# Patient Record
Sex: Male | Born: 1950
Health system: Southern US, Community
[De-identification: ages and names within clinical notes are randomized; demographics above are authoritative.]

## PROBLEM LIST (undated history)

## (undated) DIAGNOSIS — H60399 Other infective otitis externa, unspecified ear: Secondary | ICD-10-CM

## (undated) DIAGNOSIS — I214 Non-ST elevation (NSTEMI) myocardial infarction: Secondary | ICD-10-CM

## (undated) DIAGNOSIS — I2510 Atherosclerotic heart disease of native coronary artery without angina pectoris: Secondary | ICD-10-CM

## (undated) DIAGNOSIS — K219 Gastro-esophageal reflux disease without esophagitis: Secondary | ICD-10-CM

## (undated) DIAGNOSIS — Z860100 Personal history of colon polyps, unspecified: Secondary | ICD-10-CM

## (undated) DIAGNOSIS — I700 Atherosclerosis of aorta: Secondary | ICD-10-CM

## (undated) DIAGNOSIS — K2270 Barrett's esophagus without dysplasia: Secondary | ICD-10-CM

## (undated) DIAGNOSIS — K5909 Other constipation: Secondary | ICD-10-CM

## (undated) DIAGNOSIS — E7800 Pure hypercholesterolemia, unspecified: Secondary | ICD-10-CM

## (undated) DIAGNOSIS — E785 Hyperlipidemia, unspecified: Secondary | ICD-10-CM

## (undated) DIAGNOSIS — I6529 Occlusion and stenosis of unspecified carotid artery: Secondary | ICD-10-CM

## (undated) DIAGNOSIS — J449 Chronic obstructive pulmonary disease, unspecified: Secondary | ICD-10-CM

## (undated) DIAGNOSIS — I739 Peripheral vascular disease, unspecified: Secondary | ICD-10-CM

## (undated) DIAGNOSIS — H409 Unspecified glaucoma: Secondary | ICD-10-CM

## (undated) DIAGNOSIS — Z972 Presence of dental prosthetic device (complete) (partial): Secondary | ICD-10-CM

## (undated) DIAGNOSIS — J4489 Other specified chronic obstructive pulmonary disease: Secondary | ICD-10-CM

## (undated) HISTORY — DX: Peripheral vascular disease, unspecified: I73.9

## (undated) HISTORY — DX: Gastro-esophageal reflux disease without esophagitis: K21.9

## (undated) HISTORY — DX: Atherosclerosis of aorta: I70.0

## (undated) HISTORY — DX: Pure hypercholesterolemia, unspecified: E78.00

## (undated) HISTORY — DX: Chronic obstructive pulmonary disease, unspecified: J44.9

## (undated) HISTORY — DX: Personal history of colonic polyps: Z86.010

## (undated) HISTORY — DX: Occlusion and stenosis of unspecified carotid artery: I65.29

## (undated) HISTORY — DX: Other constipation: K59.09

## (undated) HISTORY — PX: ESOPHAGOGASTRODUODENOSCOPY: SHX1529

## (undated) HISTORY — DX: Other infective otitis externa, unspecified ear: H60.399

## (undated) HISTORY — PX: UMBILICAL HERNIA REPAIR: SHX196

## (undated) HISTORY — DX: Other specified chronic obstructive pulmonary disease: J44.89

## (undated) HISTORY — DX: Atherosclerotic heart disease of native coronary artery without angina pectoris: I25.10

## (undated) HISTORY — DX: Hyperlipidemia, unspecified: E78.5

## (undated) HISTORY — DX: Unspecified glaucoma: H40.9

## (undated) HISTORY — PX: CORONARY STENT PLACEMENT: SHX1402

## (undated) HISTORY — DX: Personal history of colon polyps, unspecified: Z86.0100

## (undated) HISTORY — DX: Barrett's esophagus without dysplasia: K22.70

---

## 2002-09-23 ENCOUNTER — Encounter: Payer: Self-pay | Admitting: Internal Medicine

## 2003-02-17 ENCOUNTER — Encounter: Payer: Self-pay | Admitting: Internal Medicine

## 2005-08-09 ENCOUNTER — Ambulatory Visit: Payer: Self-pay | Admitting: Internal Medicine

## 2006-03-07 ENCOUNTER — Ambulatory Visit: Payer: Self-pay | Admitting: Internal Medicine

## 2006-03-15 ENCOUNTER — Encounter: Payer: Self-pay | Admitting: Internal Medicine

## 2006-03-15 ENCOUNTER — Ambulatory Visit: Payer: Self-pay | Admitting: Cardiology

## 2006-03-15 ENCOUNTER — Encounter (HOSPITAL_COMMUNITY): Admission: RE | Admit: 2006-03-15 | Discharge: 2006-03-15 | Payer: Self-pay | Admitting: Internal Medicine

## 2006-03-17 ENCOUNTER — Inpatient Hospital Stay (HOSPITAL_COMMUNITY): Admission: AD | Admit: 2006-03-17 | Discharge: 2006-03-18 | Payer: Self-pay | Admitting: Cardiology

## 2006-03-17 ENCOUNTER — Ambulatory Visit: Payer: Self-pay | Admitting: Cardiology

## 2006-04-07 ENCOUNTER — Ambulatory Visit: Payer: Self-pay | Admitting: Cardiology

## 2006-05-02 ENCOUNTER — Ambulatory Visit: Payer: Self-pay | Admitting: Cardiology

## 2006-09-22 ENCOUNTER — Telehealth (INDEPENDENT_AMBULATORY_CARE_PROVIDER_SITE_OTHER): Payer: Self-pay | Admitting: *Deleted

## 2006-11-01 ENCOUNTER — Ambulatory Visit: Payer: Self-pay | Admitting: Cardiology

## 2006-11-27 ENCOUNTER — Ambulatory Visit: Payer: Self-pay | Admitting: Cardiology

## 2006-11-27 LAB — CONVERTED CEMR LAB
Direct LDL: 103.3 mg/dL
HDL: 30 mg/dL — ABNORMAL LOW (ref >39.0)
Triglycerides: 262 mg/dL (ref 0–149)
VLDL: 52 mg/dL — ABNORMAL HIGH (ref 0–40)

## 2007-02-26 ENCOUNTER — Ambulatory Visit: Payer: Self-pay

## 2007-02-26 ENCOUNTER — Encounter: Payer: Self-pay | Admitting: Internal Medicine

## 2007-04-05 ENCOUNTER — Other Ambulatory Visit: Payer: Self-pay

## 2007-04-05 ENCOUNTER — Inpatient Hospital Stay: Payer: Self-pay | Admitting: Internal Medicine

## 2007-04-06 ENCOUNTER — Encounter: Payer: Self-pay | Admitting: Internal Medicine

## 2007-04-11 DIAGNOSIS — Z860100 Personal history of colon polyps, unspecified: Secondary | ICD-10-CM | POA: Insufficient documentation

## 2007-04-11 DIAGNOSIS — J449 Chronic obstructive pulmonary disease, unspecified: Secondary | ICD-10-CM | POA: Insufficient documentation

## 2007-04-11 DIAGNOSIS — E7800 Pure hypercholesterolemia, unspecified: Secondary | ICD-10-CM

## 2007-04-11 DIAGNOSIS — J439 Emphysema, unspecified: Secondary | ICD-10-CM | POA: Insufficient documentation

## 2007-04-11 DIAGNOSIS — Z9861 Coronary angioplasty status: Secondary | ICD-10-CM

## 2007-04-11 DIAGNOSIS — H409 Unspecified glaucoma: Secondary | ICD-10-CM | POA: Insufficient documentation

## 2007-04-11 DIAGNOSIS — K219 Gastro-esophageal reflux disease without esophagitis: Secondary | ICD-10-CM | POA: Insufficient documentation

## 2007-04-11 DIAGNOSIS — I2510 Atherosclerotic heart disease of native coronary artery without angina pectoris: Secondary | ICD-10-CM

## 2007-04-11 DIAGNOSIS — E785 Hyperlipidemia, unspecified: Secondary | ICD-10-CM | POA: Insufficient documentation

## 2007-04-11 DIAGNOSIS — K2270 Barrett's esophagus without dysplasia: Secondary | ICD-10-CM | POA: Insufficient documentation

## 2007-04-15 DIAGNOSIS — E785 Hyperlipidemia, unspecified: Secondary | ICD-10-CM

## 2007-04-16 ENCOUNTER — Telehealth (INDEPENDENT_AMBULATORY_CARE_PROVIDER_SITE_OTHER): Payer: Self-pay | Admitting: *Deleted

## 2007-04-16 ENCOUNTER — Ambulatory Visit: Payer: Self-pay | Admitting: Internal Medicine

## 2007-04-16 DIAGNOSIS — K5909 Other constipation: Secondary | ICD-10-CM | POA: Insufficient documentation

## 2007-05-25 ENCOUNTER — Ambulatory Visit: Payer: Self-pay | Admitting: Internal Medicine

## 2007-05-30 ENCOUNTER — Telehealth (INDEPENDENT_AMBULATORY_CARE_PROVIDER_SITE_OTHER): Payer: Self-pay | Admitting: *Deleted

## 2007-06-11 ENCOUNTER — Ambulatory Visit: Payer: Self-pay | Admitting: Cardiology

## 2008-08-25 DIAGNOSIS — I200 Unstable angina: Secondary | ICD-10-CM | POA: Insufficient documentation

## 2008-08-27 ENCOUNTER — Ambulatory Visit: Payer: Self-pay | Admitting: Cardiology

## 2008-08-27 DIAGNOSIS — F17200 Nicotine dependence, unspecified, uncomplicated: Secondary | ICD-10-CM

## 2008-08-27 DIAGNOSIS — Z720 Tobacco use: Secondary | ICD-10-CM | POA: Insufficient documentation

## 2008-08-27 DIAGNOSIS — R079 Chest pain, unspecified: Secondary | ICD-10-CM | POA: Insufficient documentation

## 2008-09-03 ENCOUNTER — Telehealth (INDEPENDENT_AMBULATORY_CARE_PROVIDER_SITE_OTHER): Payer: Self-pay | Admitting: *Deleted

## 2008-09-04 ENCOUNTER — Encounter: Payer: Self-pay | Admitting: Cardiology

## 2008-09-04 ENCOUNTER — Ambulatory Visit: Payer: Self-pay

## 2008-09-04 ENCOUNTER — Ambulatory Visit (HOSPITAL_COMMUNITY): Admission: RE | Admit: 2008-09-04 | Discharge: 2008-09-04 | Payer: Self-pay | Admitting: Cardiology

## 2008-09-04 ENCOUNTER — Encounter: Payer: Self-pay | Admitting: Cardiovascular Disease

## 2008-09-23 ENCOUNTER — Telehealth: Payer: Self-pay | Admitting: Internal Medicine

## 2008-10-07 ENCOUNTER — Ambulatory Visit: Payer: Self-pay | Admitting: Cardiology

## 2008-10-08 LAB — CONVERTED CEMR LAB
AST: 24 units/L (ref 0–37)
Cholesterol: 145 mg/dL (ref 0–200)
HDL: 31 mg/dL — ABNORMAL LOW (ref >39.00)
LDL Cholesterol: 84 mg/dL (ref 0–99)
Total Bilirubin: 1 mg/dL (ref 0.3–1.2)
Total Protein: 6.8 g/dL (ref 6.0–8.3)

## 2008-10-09 ENCOUNTER — Telehealth: Payer: Self-pay | Admitting: Cardiology

## 2008-12-18 ENCOUNTER — Encounter (INDEPENDENT_AMBULATORY_CARE_PROVIDER_SITE_OTHER): Payer: Self-pay | Admitting: *Deleted

## 2009-03-03 ENCOUNTER — Ambulatory Visit: Payer: Self-pay | Admitting: Cardiology

## 2009-06-11 ENCOUNTER — Ambulatory Visit: Payer: Self-pay | Admitting: Family Medicine

## 2009-06-22 ENCOUNTER — Telehealth: Payer: Self-pay | Admitting: Internal Medicine

## 2009-06-22 ENCOUNTER — Encounter (INDEPENDENT_AMBULATORY_CARE_PROVIDER_SITE_OTHER): Payer: Self-pay | Admitting: *Deleted

## 2009-07-15 ENCOUNTER — Ambulatory Visit: Payer: Self-pay | Admitting: Internal Medicine

## 2009-07-15 ENCOUNTER — Telehealth: Payer: Self-pay | Admitting: Internal Medicine

## 2009-07-15 DIAGNOSIS — H60399 Other infective otitis externa, unspecified ear: Secondary | ICD-10-CM | POA: Insufficient documentation

## 2009-07-30 ENCOUNTER — Ambulatory Visit: Payer: Self-pay | Admitting: Internal Medicine

## 2009-07-30 DIAGNOSIS — R1319 Other dysphagia: Secondary | ICD-10-CM

## 2009-09-14 ENCOUNTER — Ambulatory Visit: Payer: Self-pay | Admitting: Internal Medicine

## 2009-09-14 LAB — CONVERTED CEMR LAB: UREASE: NEGATIVE

## 2009-09-16 ENCOUNTER — Encounter: Payer: Self-pay | Admitting: Internal Medicine

## 2009-12-22 ENCOUNTER — Telehealth: Payer: Self-pay | Admitting: Cardiology

## 2010-01-26 ENCOUNTER — Ambulatory Visit: Payer: Self-pay | Admitting: Cardiology

## 2010-01-26 DIAGNOSIS — R0989 Other specified symptoms and signs involving the circulatory and respiratory systems: Secondary | ICD-10-CM | POA: Insufficient documentation

## 2010-01-27 ENCOUNTER — Telehealth: Payer: Self-pay | Admitting: Cardiology

## 2010-02-24 ENCOUNTER — Ambulatory Visit: Payer: Self-pay | Admitting: Cardiology

## 2010-02-24 ENCOUNTER — Ambulatory Visit: Payer: Self-pay

## 2010-03-30 ENCOUNTER — Telehealth: Payer: Self-pay | Admitting: Cardiology

## 2010-04-06 ENCOUNTER — Ambulatory Visit: Payer: Self-pay | Admitting: Cardiology

## 2010-05-23 LAB — CONVERTED CEMR LAB
ALT: 19 units/L (ref 0–53)
AST: 25 units/L (ref 0–37)
Alkaline Phosphatase: 62 units/L (ref 39–117)
Bilirubin, Direct: 0.1 mg/dL (ref 0.0–0.3)
Cholesterol: 138 mg/dL (ref 0–200)
Total Bilirubin: 0.9 mg/dL (ref 0.3–1.2)
Total CHOL/HDL Ratio: 4
VLDL: 26.8 mg/dL (ref 0.0–40.0)

## 2010-05-25 NOTE — Assessment & Plan Note (Signed)
Summary: CHECK KNOT ON EAR/CLE   Vital Signs:  Patient profile:   60 year old male Height:      71 inches Weight:      195.0 pounds BMI:     27.30 Temp:     98.0 degrees F oral Pulse rate:   70 / minute Pulse rhythm:   regular BP sitting:   110 / 70  (left arm) Cuff size:   regular  Vitals Entered By: Benny Lennert CMA Duncan Dull) (June 11, 2009 11:47 AM)  History of Present Illness: Chief complaint check know on ear  Acute Visit History:      The patient complains of earache and headache.  He denies chest pain, cough, diarrhea, fever, sinus problems, and sore throat.  Other comments include: HAS HAD SOME DRAINAGE ON THE RIGHT EAR, PAIN WITH MOTION - ALL AROUND RIGHT EAR.        The earache is located on the right side.  There is no history of recent antibiotic usage, cold/URI symptoms, or recurrent otitis media associated with the earache.        Urine output has been normal.  He is tolerating clear liquids.        Allergies (verified): No Known Drug Allergies  Past History:  Past medical, surgical, family and social histories (including risk factors) reviewed, and no changes noted (except as noted below).  Past Medical History: Reviewed history from 03/03/2009 and no changes required. Current Problems:  CORONARY ARTERY DISEASE (ICD-414.00)chest the HYPERLIPIDEMIA (ICD-272.4) BARRETTS ESOPHAGUS (ICD-530.85) GLAUCOMA (ICD-365.9) GERD (ICD-530.81) COPD (ICD-496) COLONIC POLYPS, HX OF (ICD-V12.72)  Past Surgical History: Reviewed history from 08/25/2008 and no changes required. Umbilical hernia as an infant EGD (08/2002)  Coronary artery disease status post bare metal stent placement to the proximal left anterior descending with   medically managed 75% right coronary disease.   Family History: Reviewed history from 08/25/2008 and no changes required. Father: deceased- CVA after valve surgery Mother:  Siblings: 1 sister No CAD, HTN, DM  Social History: Reviewed  history from 08/25/2008 and no changes required. Marital Status: Married Children: 1 son Occupation: Games developer Current Smoker Alcohol use-yes  Review of Systems       REVIEW OF SYSTEMS GEN: Acute illness details above. CV: No chest pain or SOB GI: No noted N or V Otherwise, pertinent positives and negatives are noted in the HPI.   Physical Exam  Additional Exam:  Gen: WDWN, alert,appropriate and cooperative throughout examination  HEENT: Normocephalic and atraumatic. Throat clear, w/o exudate, rhinnorhea. L ear: no bulging, COL present, grey, no red, good landmarks R ear: bulging R TM with yellowish discharge in the canal, reddish and indistinct landmarks. Posterior auricular lymph node  Neck: No ant or post LAD.  CV: RRR, No M/G/R  Pulm: Breathing comfortably in no respiratory distress. no wheezing, crackles, rhonchi.  Abd: S,NT,ND,+BS  Ext: No clubbing, cyanosis, or edema    Impression & Recommendations:  Problem # 1:  OTITIS MEDIA, SUPPURATIVE, ACUTE, W/RUPTURE OF TM (ICD-382.01) Assessment New  His updated medication list for this problem includes:    Aspirin Ec 325 Mg Tbec (Aspirin) .Marland Kitchen... Take one by mouth daily    Amoxicillin 500 Mg Tabs (Amoxicillin) .Marland KitchenMarland KitchenMarland KitchenMarland Kitchen 3 by mouth two times a day (om dosing - high dosing)  Instructed on prevention and treatment. Call if no improvement in 48-72 hours or sooner if worsening symptoms.   Complete Medication List: 1)  Lumigan 0.03 % Soln (Bimatoprost) .... Use as directed 2)  Aspirin Ec 325 Mg Tbec (Aspirin) .... Take one by mouth daily 3)  Metoprolol Tartrate 25 Mg Tabs (Metoprolol tartrate) .... Take 1 tablet by mouth twice a day 4)  Nitrostat 0.4 Mg Subl (Nitroglycerin) 5)  Multivitamins Tabs (Multiple vitamin) .... Tab by mouth once daily 6)  Lactulose 10 Gm/82ml Soln (Lactulose) .... Take 3 teaspoonfuls (1 tablespoonful) by mouth twice daily 7)  Simvastatin 40 Mg Tabs (Simvastatin) .... Take one tablet by mouth  daily at bedtime 8)  Fish Oil Oil (Fish oil) .Marland Kitchen.. 1 tab by mouth  once daily 9)  Calcium-vitamin D 500-200 Mg-unit Tabs (Calcium carbonate-vitamin d) .Marland Kitchen.. 1 tab by mouth once daily 10)  Amoxicillin 500 Mg Tabs (Amoxicillin) .... 3 by mouth two times a day (om dosing - high dosing) Prescriptions: AMOXICILLIN 500 MG TABS (AMOXICILLIN) 3 by mouth two times a day (OM dosing - high dosing)  #60 x 0   Entered and Authorized by:   Hannah Beat MD   Signed by:   Hannah Beat MD on 06/11/2009   Method used:   Electronically to        [facility] [address] (retail)       [address]       [place], [place]  [address]       Ph: [phone]       Fax: [phone]   RxID:   [id number]   Current Allergies (reviewed today): No known allergies

## 2010-05-25 NOTE — Progress Notes (Signed)
Summary: still having ear problems  Phone Note Call from Patient Call back at Home Phone [phone]   Caller: Spouse Call For: [name] Salt MD Summary of Call: Wife says that patient has now finished his amoxicillin that he was taking for his ear infection. He is still having ear problems and wants to know if he can have another round of antibiotics. Uses [facility] [address].  Initial call taken by: Melody Comas,  June 22, 2009 1:34 PM  Follow-up for Phone Call        Please let them know I sent a new Rx for stronger antibiotic Follow-up by: [name] Salt MD,  June 22, 2009 1:37 PM  Additional Follow-up for Phone Call Additional follow up Details #1::        no answer at home number, no answering machine, phone just rang, will try later. DeShannon Smith CMA Duncan Dull)  June 22, 2009 3:13 PM   Advised pt's wife. Additional Follow-up by: Lowella Petties CMA,  June 22, 2009 3:25 PM    New/Updated Medications: AMOXICILLIN-POT CLAVULANATE 875-125 MG TABS (AMOXICILLIN-POT CLAVULANATE) 1 tab by mouth two times a day with food for ear infection Prescriptions: AMOXICILLIN-POT CLAVULANATE 875-125 MG TABS (AMOXICILLIN-POT CLAVULANATE) 1 tab by mouth two times a day with food for ear infection  #20 x 0   Entered and Authorized by:   [name] Salt MD   Signed by:   [name] Salt MD on 06/22/2009   Method used:   Electronically to        [facility] [address] (retail)       [address]       [place], [place]  [address]       Ph: [phone]       Fax: [phone]   RxID:   [id number]

## 2010-05-25 NOTE — Procedures (Signed)
Summary: Colonoscopy/[facility]  Colonoscopy/[facility]   Imported By: Sherian Rein 07/31/2009 07:27:33  _____________________________________________________________________  External Attachment:    Type:   Image     Comment:   External Document

## 2010-05-25 NOTE — Procedures (Signed)
Summary: Colonoscopy  Patient: [name] [name] Note: All result statuses are Final unless otherwise noted.  Tests: (1) Colonoscopy (COL)   COL Colonoscopy           DONE     [facility]     [address]     [place], [place]  [address]           COLONOSCOPY PROCEDURE REPORT           PATIENT:  [name] [name], [name] [name]  MR#:  [id number]     BIRTHDATE:  11/02/1950, 59 yrs. old  GENDER:  male     ENDOSCOPIST:  Wilhemina Bonito. Eda Keys, MD     REF. BY:  Office     PROCEDURE DATE:  09/14/2009     PROCEDURE:  Colonoscopy with snare polypectomy x 5     ASA CLASS:  Class III     INDICATIONS:  history of pre-cancerous (adenomatous) colon polyps     ; index exam 01-2003 w/ 10mm adenoma     MEDICATIONS:   Fentanyl 75 mcg IV, Versed 5 mg IV           DESCRIPTION OF PROCEDURE:   After the risks benefits and     alternatives of the procedure were thoroughly explained, informed     consent was obtained.  Digital rectal exam was performed and     revealed no abnormalities.   The LB [id number] endoscope     was introduced through the anus and advanced to the cecum, which     was identified by both the appendix and ileocecal valve, without     limitations.Time to cecum = 2:30 min. The quality of the prep was     excellent, using MoviPrep.  The instrument was then slowly     withdrawn (time = 12:20 min) as the colon was fully examined.     <<PROCEDUREIMAGES>>           FINDINGS:  Five polyps were found in the cecum (5mm) and ascending     colon (73mm,3mm,4mm,4mm,7mm). Polyps were snared without cautery.     Retrieval was successful.   Moderate diverticulosis was found in     the left colon.   Retroflexed views in the rectum revealed     internal hemorrhoids.    The scope was then withdrawn from the     patient and the procedure completed.           COMPLICATIONS:  None     ENDOSCOPIC IMPRESSION:     1) Five polyps - removed     2) Moderate diverticulosis in the left colon     3) Internal  hemorrhoids           RECOMMENDATIONS:     1) Follow up colonoscopy in 3 years if >2 adenomas; otherwise 5                 ______________________________     Wilhemina Bonito. Eda Keys, MD           CC:  Karie Schwalbe, MD; The Patient           n.     eSIGNED:   Wilhemina Bonito. Eda Keys at 09/14/2009 02:22 PM           Anson Crofts, [id number]  Note: An exclamation mark (!) indicates a result that was not dispersed into the flowsheet. Document Creation Date: 09/14/2009 2:22 PM _______________________________________________________________________  (1) Order result status: Final  Collection or observation date-time: 09/14/2009 14:14 Requested date-time:  Receipt date-time:  Reported date-time:  Referring Physician:   Ordering Physician: Fransico Setters [phone]) Specimen Source:  Source: Launa Grill Order Number: [id number] Lab site:   Appended Document: Colonoscopy recall     Procedures Next Due Date:    Colonoscopy: 08/2012

## 2010-05-25 NOTE — Procedures (Signed)
Summary: Colonoscopy/[facility]  Colonoscopy/[facility]   Imported By: Sherian Rein 07/31/2009 07:33:53  _____________________________________________________________________  External Attachment:    Type:   Image     Comment:   External Document

## 2010-05-25 NOTE — Progress Notes (Signed)
Summary: pt forgot to tell you about some symptoms he has been having  Phone Note Call from Patient   Caller: Spouse Britta Mccreedy [phone] Reason for Call: Talk to Nurse Summary of Call: pt forgot to tell you yesterday that he has had headaches for several week-and having numbness in left hand and thumb Initial call taken by: Glynda Jaeger,  January 27, 2010 11:23 AM  Follow-up for Phone Call        SPOKE WITH PT C/O H/A AND SOMETIMES THUMB GOES NUMB OR STAYS NUMB INFORMED PT  CAROTID SCHEDULED PER DR CRENSHAW  WILL FORWARD  TO  DR Jens Som AND IF ANY OTHER TESTING NEEDED WILL CALL BACK VERBALIZED UNDERSTANDING. Follow-up by: Scherrie Bateman, LPN,  January 27, 2010 3:35 PM  Additional Follow-up for Phone Call Additional follow up Details #1::        Carotid dopplers as scheduled Ferman Hamming, MD, [facility]  January 27, 2010 4:36 PM      Appended Document: pt forgot to tell you about some symptoms he has been having PT AWARE./CY

## 2010-05-25 NOTE — Progress Notes (Signed)
Summary: refill requst  Phone Note Refill Request Message from:  Patient on March 30, 2010 4:26 PM  Refills Requested: Medication #1:  SIMVASTATIN 80 MG TABS Take one half  tablet by mouth daily at bedtime [address]   Method Requested: Telephone to Pharmacy Initial call taken by: Glynda Jaeger,  March 30, 2010 4:27 PM    Prescriptions: SIMVASTATIN 80 MG TABS (SIMVASTATIN) Take one half  tablet by mouth daily at bedtime  #30 x 12   Entered by:   Kem Parkinson   Authorized by:   Ferman Hamming, MD, [facility]   Signed by:   Kem Parkinson on 03/31/2010   Method used:   Electronically to        [facility] [address] (retail)       [address]       [place], [place]  [address]       Ph: [phone]       Fax: [phone]   RxID:   [id number]

## 2010-05-25 NOTE — Assessment & Plan Note (Signed)
Summary: DYSPHAGIA /  GERD / history of colonic adenoma   History of Present Illness Visit Type: new patient  Primary GI MD: Yancey Flemings MD Primary Provider: Tillman Abide, MD  Requesting Provider: n/a Chief Complaint: dysphagia, acid reflux, heartburn, and bloating  History of Present Illness:   60 year old white male with multiple medical problems including hyperlipidemia, coronary artery disease, COPD, and GERD. The patient was seen one time is a direct referral for screening colonoscopy in October 2004. He was found to have several colon polyps, including a large adenoma. Followup in 3 years recommended. He received a recall letter but did not respond. He presents today, at the urging of his wife, regarding the need for surveillance colonoscopy as well as problems with reflux and dysphagia. I have reviewed some old outside records and it appears that the patient may have Barrett's esophagus noted on upper endoscopy in May of 2004. This was performed in [place]. No pathology available to review. Patient reports a greater than a decade of heartburn and regurgitation. As well, several years of intermittent solid food dysphagia. Previously on PPI regularly. Now taking PPI p.r.n. No weight loss. He smokes but does not use alcohol. Has multiple chronic medical problems are stable. No lower GI complaints. No interval development of colon cancer in his family.   GI Review of Systems    Reports acid reflux, bloating, dysphagia with solids, and  heartburn.      Denies abdominal pain, belching, chest pain, dysphagia with liquids, loss of appetite, nausea, vomiting, vomiting blood, weight loss, and  weight gain.        Denies anal fissure, black tarry stools, change in bowel habit, constipation, diarrhea, diverticulosis, fecal incontinence, heme positive stool, hemorrhoids, irritable bowel syndrome, jaundice, light color stool, liver problems, rectal bleeding, and  rectal pain.    Current Medications  (verified): 1)  Lumigan 0.03 %  Soln (Bimatoprost) .... Use As Directed 2)  Aspirin Ec 325 Mg  Tbec (Aspirin) .... Take One By Mouth Daily 3)  Metoprolol Tartrate 25 Mg Tabs (Metoprolol Tartrate) .... Take 1 Tablet By Mouth Twice A Day 4)  Nitrostat 0.4 Mg Subl (Nitroglycerin) .... As Needed 5)  Multivitamins   Tabs (Multiple Vitamin) .... One Tablet By Mouth Once Daily 6)  Lactulose 10 Gm/77ml Soln (Lactulose) .... Take 3 Teaspoonfuls (1 Tablespoonful) By Mouth Twice Daily 7)  Simvastatin 40 Mg Tabs (Simvastatin) .... Take 1/2  Tablet By Mouth Daily At Bedtime 8)  Fish Oil   Oil (Fish Oil) .Marland Kitchen.. 1 Tab By Mouth  Once Daily 9)  Calcium-Vitamin D 500-200 Mg-Unit Tabs (Calcium Carbonate-Vitamin D) .Marland Kitchen.. 1 Tab By Mouth Once Daily 10)  Prilosec Otc 20 Mg Tbec (Omeprazole Magnesium) .... As Needed  Allergies (verified): No Known Drug Allergies  Past History:  Past Medical History: Reviewed history from 07/29/2009 and no changes required. CORONARY ARTERY DISEASE (ICD-414.00)chest the HYPERLIPIDEMIA (ICD-272.4) BARRETTS ESOPHAGUS (ICD-530.85) GLAUCOMA (ICD-365.9) GERD (ICD-530.81) COPD (ICD-496) COLONIC POLYPS, HX OF (ICD-V12.72) Adenomatous/Hyperplastic Diverticulosis Hemorrhoids  Past Surgical History: Reviewed history from 08/25/2008 and no changes required. Umbilical hernia as an infant EGD (08/2002)  Coronary artery disease status post bare metal stent placement to the proximal left anterior descending with   medically managed 75% right coronary disease.   Family History: Father: deceased- CVA after valve surgery Mother:  Siblings: 1 sister No CAD, HTN, DM No FH of Colon Cancer:  Social History: Occupation: Games developer Marital Status: Married Children: 1 son Current Smoker Alcohol Use -  no Daily Caffeine Use: 3 daily  Illicit Drug Use - no Drug Use:  no  Review of Systems       The patient complains of back pain and muscle pains/cramps.  The patient denies  allergy/sinus, anemia, anxiety-new, arthritis/joint pain, blood in urine, breast changes/lumps, change in vision, confusion, cough, coughing up blood, depression-new, fainting, fatigue, fever, headaches-new, hearing problems, heart murmur, heart rhythm changes, itching, night sweats, nosebleeds, shortness of breath, skin rash, sleeping problems, sore throat, swelling of feet/legs, swollen lymph glands, thirst - excessive, urination - excessive, urination changes/pain, urine leakage, vision changes, and voice change.    Vital Signs:  Patient profile:   60 year old male Height:      71 inches Weight:      194 pounds BMI:     27.16 BSA:     2.08 Pulse rate:   74 / minute Pulse rhythm:   regular BP sitting:   100 / 60  (left arm) Cuff size:   regular  Vitals Entered By: Ok Anis CMA (July 30, 2009 9:46 AM)  Physical Exam  General:  Well developed, well nourished, no acute distress. Head:  Normocephalic and atraumatic. Eyes:  PERRLA, no icterus. Ears:  Normal auditory acuity. Nose:  No deformity, discharge,  or lesions. Mouth:  No deformity or lesions Neck:  Supple; no masses or thyromegaly. Lungs:  Clear throughout to auscultation. Heart:  Regular rate and rhythm; no murmurs, rubs,  or bruits. Abdomen:  Soft, nontender and nondistended. No masses, hepatosplenomegaly or hernias noted. Normal bowel sounds. Rectal:  deferred until colonoscopy Prostate:  deferred until colonoscopy Msk:  Symmetrical with no gross deformities. Normal posture. Pulses:  Normal pulses noted. Extremities:  No clubbing, cyanosis, edema or deformities noted. Neurologic:  Alert and  oriented x4;  grossly normal neurologically. Skin:  Intact without significant lesions or rashes.. Multiple calluses on the hands Cervical Nodes:  No significant cervical adenopathy.no supraclavicular adenopathy Psych:  Alert and cooperative. Normal mood and affect.   Impression & Recommendations:  Problem # 1:  GERD  (ICD-530.81) Chronic GERD with possible Barrett's esophagus on endoscopy elsewhere in 2004. Currently taking PPI sporadically and experiencing intermittent reflux symptoms. As well, ongoing problems with dysphagia.  Plan: #1. Reflux precautions #2. Stop smoking #3. Take Prilosec OTC 20 mg DAILY. Best taken in the morning 30 minutes before her first meal #4. Schedule endoscopy to assess for Barrett's esophagus. The nature of the procedure as well as the risks, benefits, and alternatives have been reviewed. He understood and agreed to proceed  Problem # 2:  DYSPHAGIA (ICD-787.29) intermittent solid food dysphagia. Likely due to peptic stricture.  Plan: #1. Possible esophageal dilation at the time of endoscopy. The nature of the procedure as well as the risks, benefits, and alternatives were reviewed in detail. He understood and agreed to proceed  Problem # 3:  COLONIC POLYPS, HX OF (ICD-V12.72) history of adenomatous colon polyps. Overdue for surveillance. He is an appropriate candidate without contraindication. Higher than baseline risks due to his comorbidities.  Plan: #1. Colonoscopy. The nature of the procedure as well as the risks, benefits, and alternatives were reviewed in detail. He understood and agreed to proceed. #2. Movi per prescribed. Patient instructed on its use  Other Orders: Colon/Endo (Colon/Endo)  Patient Instructions: 1)  Colon/Endo [facility] 09/14/09 1:30 pm arrive at 12:30 pm 2)  Movi prep instructions given  and Rx. sent to pharmacy for you to pick up. 3)  Colonoscopy and Flexible Sigmoidoscopy brochure  given.  4)  Upper Endoscopy brochure given.  5)  The medication list was reviewed and reconciled.  All changed / newly prescribed medications were explained.  A complete medication list was provided to the patient / caregiver. 6)  printed and given to patient. Milford Cage [facility]  July 30, 2009 10:16 AM 7)  copy: Dr. Tillman Abide Prescriptions: MOVIPREP 100 GM  SOLR  (PEG-KCL-NACL-NASULF-NA ASC-C) As per prep instructions.  #1 x 0   Entered by:   Milford Cage NCMA   Authorized by:   Hilarie Fredrickson MD   Signed by:   Milford Cage NCMA on 07/30/2009   Method used:   Electronically to        [facility] [address] (retail)       [address], [address]       [place], [place]  [address]       Ph: [phone]       Fax: [phone]   RxID:   [id number]

## 2010-05-25 NOTE — Procedures (Signed)
Summary: Upper Endoscopy  Patient: [name] [name] Note: All result statuses are Final unless otherwise noted.  Tests: (1) Upper Endoscopy (EGD)   EGD Upper Endoscopy       DONE     [facility]     [address]     [place], [place]  [address]           ENDOSCOPY PROCEDURE REPORT           PATIENT:  [name], [name]  MR#:  [id number]     BIRTHDATE:  [birth year], 59 yrs. old  GENDER:  male           ENDOSCOPIST:  Wilhemina Bonito. Eda Keys, MD     Referred by:  Office           PROCEDURE DATE:  09/14/2009     PROCEDURE:  EGD with biopsy,     Elease Hashimoto Dilation of Esophagus - 2F     ASA CLASS:  Class III     INDICATIONS:  dysphagia ; GERD w/ ?? Barrett's           MEDICATIONS:   There was residual sedation effect present from     prior procedure., Fentanyl 25 mcg IV, Versed 2 mg IV     TOPICAL ANESTHETIC:  Exactacain Spray           DESCRIPTION OF PROCEDURE:   After the risks benefits and     alternatives of the procedure were thoroughly explained, informed     consent was obtained.  The [facility] [id number] endoscope was     introduced through the mouth and advanced to the third portion of     the duodenum, without limitations.  The instrument was slowly     withdrawn as the mucosa was fully examined.     <<PROCEDUREIMAGES>>           A 15mm ring-like peptic stricture was found in the distal     esophagus. As well, mild Esophagitis was found. No Obvious     Barrett's (tongue of columnar mucosa at z line). Nonerosive     Duodenitis was found in the bulb of the duodenum.  CLO bx taken.     Retroflexed views revealed no abnormalities.The exam was otherwise     normal.    The scope was then withdrawn from the patient and the     procedure completed.           THERAPY: MALONEY DILATION 2F W/O RESITANCE OR HEME. TOLERATED     WELL           COMPLICATIONS:  None           ENDOSCOPIC IMPRESSION:     1) Stricture in the distal esophagus - s/p 65 F Maloney dilation           2) Mild  Esophagitis in the distal esophagus     3) Duodenitis in the bulb of duodenum     4) Gerd           RECOMMENDATIONS:     1) Anti-reflux regimen to be follow     2) continue Prilosec daily     3) Clear liquids until 4pm, then soft foods rest of day. Resume     prior diet tomorrow.     4) Rx CLO if positive           ______________________________     Wilhemina Bonito. Eda Keys, MD  CC:  Karie Schwalbe, MD, The Patient           n.     eSIGNED:   Wilhemina Bonito. Eda Keys at 09/14/2009 02:39 PM           Anson Crofts, [id number]  Note: An exclamation mark (!) indicates a result that was not dispersed into the flowsheet. Document Creation Date: 09/14/2009 2:39 PM _______________________________________________________________________  (1) Order result status: Final Collection or observation date-time: 09/14/2009 14:29 Requested date-time:  Receipt date-time:  Reported date-time:  Referring Physician:   Ordering Physician: Fransico Setters [phone]) Specimen Source:  Source: Launa Grill Order Number: [id number] Lab site:

## 2010-05-25 NOTE — Letter (Signed)
Summary: Patient Notice- Polyp Results  [facility]  [address] [place], [place] [address]   Phone: [phone]  Fax: [phone]        Sep 16, 2009 MRN: [id number]    [facility] [address] [place], [place]  [address]    Dear [name] [name],  I am pleased to inform you that the colon polyp(s) removed during your recent colonoscopy was (were) found to be benign (no cancer detected) upon pathologic examination.  I recommend you have a repeat colonoscopy examination in 3 years to look for recurrent polyps, as having colon polyps increases your risk for having recurrent polyps or even colon cancer in the future.  Should you develop new or worsening symptoms of abdominal pain, bowel habit changes or bleeding from the rectum or bowels, please schedule an evaluation with either your primary care physician or with me.  Additional information/recommendations:  __ No further action with gastroenterology is needed at this time. Please      follow-up with your primary care physician for your other healthcare      needs.   Please call us if you are having persistent problems or have questions about your condition that have not been fully answered at this time.  Sincerely,  Hilarie Fredrickson MD  This letter has been electronically signed by your physician.  Appended Document: Patient Notice- Polyp Results letter mailed.

## 2010-05-25 NOTE — Assessment & Plan Note (Signed)
Summary: rov/dm   Referring Provider:  n/a Primary Provider:  Tillman Abide, MD    History of Present Illness: Pleasant  male previously with s a history of coronary disease. He underwent cardiac catheterization in 2007 and had a bare-metal stent to his LAD. He has residual right coronary artery disease. Last Myoview in May of 2010 showed an ejection fraction of 66%. There was felt to be possible diaphragmatic attenuation but given mild reversibility could not exclude small area of inferior ischemia. We have treated medically. I last saw him in Nov 2010. Since then he has dyspnea with more extreme activities but not with routine activities. There's no associated chest pain. It is relieved with rest. No orthopnea, PND, pedal edema, palpitations, syncope or exertional chest pain.  Current Medications (verified): 1)  Lumigan 0.03 %  Soln (Bimatoprost) .... Use As Directed 2)  Aspirin Ec 325 Mg  Tbec (Aspirin) .... Take One By Mouth Daily 3)  Metoprolol Tartrate 25 Mg Tabs (Metoprolol Tartrate) .... Take 1 Tablet By Mouth Twice A Day 4)  Nitrostat 0.4 Mg Subl (Nitroglycerin) .... As Needed 5)  Multivitamins   Tabs (Multiple Vitamin) .... One Tablet By Mouth Once Daily 6)  Lactulose 10 Gm/61ml Soln (Lactulose) .... Take 3 Teaspoonfuls (1 Tablespoonful) By Mouth Twice Daily 7)  Simvastatin 40 Mg Tabs (Simvastatin) .... Take 1/2  Tablet By Mouth Daily At Bedtime 8)  Fish Oil   Oil (Fish Oil) .Marland Kitchen.. 1 Tab By Mouth  Once Daily 9)  Calcium-Vitamin D 500-200 Mg-Unit Tabs (Calcium Carbonate-Vitamin D) .Marland Kitchen.. 1 Tab By Mouth Once Daily 10)  Prilosec Otc 20 Mg Tbec (Omeprazole Magnesium) .... As Needed  Allergies: No Known Drug Allergies  Past History:  Past Medical History: Reviewed history from 07/29/2009 and no changes required. CORONARY ARTERY DISEASE (ICD-414.00)chest the HYPERLIPIDEMIA (ICD-272.4) BARRETTS ESOPHAGUS (ICD-530.85) GLAUCOMA (ICD-365.9) GERD (ICD-530.81) COPD (ICD-496) COLONIC  POLYPS, HX OF (ICD-V12.72) Adenomatous/Hyperplastic Diverticulosis Hemorrhoids  Past Surgical History: Umbilical hernia as an infant EGD (08/2002) Coronary artery disease status post bare metal stent placement to the proximal left anterior descending with  medically managed 75% right coronary disease.   Social History: Reviewed history from 07/30/2009 and no changes required. Occupation: Games developer Marital Status: Married Children: 1 son Current Smoker Alcohol Use - no Daily Caffeine Use: 3 daily  Illicit Drug Use - no  Review of Systems       Complains of fatigue but no fevers or chills, productive cough, hemoptysis, dysphasia, odynophagia, melena, hematochezia, dysuria, hematuria, rash, seizure activity, orthopnea, PND, pedal edema, claudication. Remaining systems are negative.'  Vital Signs:  Patient profile:   60 year old male Height:      71 inches Weight:      189 pounds BMI:     26.46 Pulse rate:   76 / minute Resp:     12 per minute BP sitting:   100 / 62  (left arm)  Vitals Entered By: Kem Parkinson (January 26, 2010 4:01 PM)  Physical Exam  General:  Well-developed well-nourished in no acute distress.  Skin is warm and dry.  HEENT is normal.  Neck is supple. No thyromegaly. left carotid bruit Chest is clear to auscultation with normal expansion.  Cardiovascular exam is regular rate and rhythm.  Abdominal exam nontender or distended. No masses palpated. Extremities show no edema. neuro grossly intact    EKG  Procedure date:  01/26/2010  Findings:      Sinus rhythm with occasional PACs. No significant  ST changes. RV conduction delay.  Impression & Recommendations:  Problem # 1:  TOBACCO ABUSE (ICD-305.1) Patient counseled on discontinuing.  Problem # 2:  CORONARY ARTERY DISEASE (ICD-414.00)  Continue aspirin and statin. Decrease Lopressor to 12.5 mg p.o. b.i.d. given complaints of fatigue. His updated medication list for this problem  includes:    Aspirin Ec 325 Mg Tbec (Aspirin) .Marland Kitchen... Take one by mouth daily    Metoprolol Tartrate 25 Mg Tabs (Metoprolol tartrate) .Marland Kitchen... Take 1 tablet by mouth twice a day    Nitrostat 0.4 Mg Subl (Nitroglycerin) .Marland Kitchen... As needed  Orders: EKG w/ Interpretation (93000)  Problem # 3:  HYPERLIPIDEMIA (ICD-272.4) Continue statin. Check lipids and liver. His updated medication list for this problem includes:    Simvastatin 40 Mg Tabs (Simvastatin) .Marland Kitchen... Take 1/2  tablet by mouth daily at bedtime  Problem # 4:  GERD (ICD-530.81)  His updated medication list for this problem includes:    Prilosec Otc 20 Mg Tbec (Omeprazole magnesium) .Marland Kitchen... As needed  Problem # 5:  CAROTID BRUIT (ICD-785.9)  Check carotid Dopplers.  Orders: Carotid Duplex (Carotid Duplex)  Patient Instructions: 1)  Your physician recommends that you schedule a follow-up appointment in: year with dr Jens Som 2)  Your physician recommends that you return for lab work ZO:XWRUE LIVER 272.4 V58.69 SAME DAY AS CAROTID 3)  Your physician has recommended you make the following change in your medication:  DECREASE METOPROLOL TO 12.5 MG BID 4)  Your physician has requested that you have a carotid duplex. This test is an ultrasound of the carotid arteries in your neck. It looks at blood flow through these arteries that supply the brain with blood. Allow one hour for this exam. There are no restrictions or special instructions.FASTING LABS SAME DAY

## 2010-05-25 NOTE — Progress Notes (Signed)
Summary: pain shoulder blades/tired  Phone Note Call from Patient   Caller: Patient Reason for Call: Talk to Nurse Summary of Call:  wife called to say pt having pain between shoulder blades and really tired , would like nurse to call him, not sure if he's having any other symptoms, feels may tell nurse more-pls call [phone] Initial call taken by: Glynda Jaeger,  December 22, 2009 9:36 AM  Follow-up for Phone Call        spoke with pt, he states for the last week or so he has had a sharp pain in the ribs in his back. he denies chest pain or pressure in his chest as he did prior to his stent. 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 he took tylenol this am and that helped the discomfort. he also c/o fatique. pt will take tylenol for his current pain and follow up appt made Deliah Goody, RN  December 22, 2009 4:27 PM

## 2010-05-25 NOTE — Progress Notes (Signed)
Summary: change in ear drops  Phone Note From Pharmacy   Caller: [facility] Summary of Call: Ear drops that were sent to pharmacy are too expensive.  [facility] is asking if they can change.  OK per Dr. Alphonsus Sias to change to ofloxacin otic susp 0.3%. Initial call taken by: Lowella Petties CMA,  July 15, 2009 12:34 PM  Follow-up for Phone Call        okay Follow-up by: Cindee Salt MD,  July 15, 2009 12:52 PM

## 2010-05-25 NOTE — Assessment & Plan Note (Signed)
Summary: FOLLOW UP WITH EAR PROBLEM   Vital Signs:  Patient profile:   60 year old male Weight:      194 pounds Temp:     98.1 degrees F oral Pulse rate:   76 / minute Pulse rhythm:   regular BP sitting:   108 / 58  (left arm) Cuff size:   large  Vitals Entered By: Mervin Hack CMA Duncan Dull) (July 15, 2009 11:36 AM) CC: ear pain   History of Present Illness: Having problems with his right ear still Drainage ongoing still with pain Hearing is basically gone occ shooting pain  No fever Doesn't feel sick  Had some relief with the amoxil, then recurred Did better with the augmentin but still didn't knock it out  Allergies: No Known Drug Allergies  Past History:  Past medical, surgical, family and social histories (including risk factors) reviewed for relevance to current acute and chronic problems.  Past Medical History: CORONARY ARTERY DISEASE (ICD-414.00)chest the HYPERLIPIDEMIA (ICD-272.4) BARRETTS ESOPHAGUS (ICD-530.85) GLAUCOMA (ICD-365.9) GERD (ICD-530.81) COPD (ICD-496) COLONIC POLYPS, HX OF (ICD-V12.72)  Past Surgical History: Reviewed history from 08/25/2008 and no changes required. Umbilical hernia as an infant EGD (08/2002)  Coronary artery disease status post bare metal stent placement to the proximal left anterior descending with   medically managed 75% right coronary disease.   Family History: Reviewed history from 08/25/2008 and no changes required. Father: deceased- CVA after valve surgery Mother:  Siblings: 1 sister No CAD, HTN, DM  Social History: Reviewed history from 08/25/2008 and no changes required. Marital Status: Married Children: 1 son Occupation: Games developer Current Smoker Alcohol use-yes  Review of Systems       No diarrhea  eating okay some trouble tolerating the statin---feels it affects his joints. Takes 1/2 and the fish oil  Physical Exam  General:  alert and normal appearance.   Ears:  L ear normal.     RIght side--no tragal tenderness canal is edematous with erythema greenish discharge close to TM Nose:  no mucosal edema, no airflow obstruction, and no intranasal foreign body.   Mouth:  no erythema and no exudates.   Neck:  supple, no masses, and no cervical lymphadenopathy.     Impression & Recommendations:  Problem # 1:  OTITIS EXTERNA (ICD-380.10) Assessment Comment Only  canal quite inflamed may still have some degree of OM will treat again with augmentin and try drops  if not improved, will set up ENT eval  His updated medication list for this problem includes:    Cipro Hc 0.2-1 % Susp (Ciprofloxacin-hydrocortisone) .Marland KitchenMarland KitchenMarland KitchenMarland Kitchen 3 drops into right ear two times a day  for 7 days  Complete Medication List: 1)  Lumigan 0.03 % Soln (Bimatoprost) .... Use as directed 2)  Aspirin Ec 325 Mg Tbec (Aspirin) .... Take one by mouth daily 3)  Metoprolol Tartrate 25 Mg Tabs (Metoprolol tartrate) .... Take 1 tablet by mouth twice a day 4)  Nitrostat 0.4 Mg Subl (Nitroglycerin) 5)  Multivitamins Tabs (Multiple vitamin) .... Tab by mouth once daily 6)  Lactulose 10 Gm/52ml Soln (Lactulose) .... Take 3 teaspoonfuls (1 tablespoonful) by mouth twice daily 7)  Simvastatin 40 Mg Tabs (Simvastatin) .... Take 1/2  tablet by mouth daily at bedtime 8)  Fish Oil Oil (Fish oil) .Marland Kitchen.. 1 tab by mouth  once daily 9)  Calcium-vitamin D 500-200 Mg-unit Tabs (Calcium carbonate-vitamin d) .Marland Kitchen.. 1 tab by mouth once daily 10)  Amoxicillin-pot Clavulanate 875-125 Mg Tabs (Amoxicillin-pot clavulanate) .Marland Kitchen.. 1 by  mouth two times a day after meals for ear infection 11)  Cipro Hc 0.2-1 % Susp (Ciprofloxacin-hydrocortisone) .... 3 drops into right ear two times a day  for 7 days  Patient Instructions: 1)  Please schedule a follow-up appointment as needed .  Prescriptions: CIPRO HC 0.2-1 % SUSP (CIPROFLOXACIN-HYDROCORTISONE) 3 drops into right ear two times a day  for 7 days  #1 bottle x 0   Entered and Authorized by:    Cindee Salt MD   Signed by:   Cindee Salt MD on 07/15/2009   Method used:   Electronically to        [facility]* (retail)       [address]       [place], [place]  [address]       Ph: [phone]       Fax: [phone]   RxID:   [id number] AMOXICILLIN-POT CLAVULANATE 875-125 MG TABS (AMOXICILLIN-POT CLAVULANATE) 1 by mouth two times a day after meals for ear infection  #30 x 0   Entered and Authorized by:   Cindee Salt MD   Signed by:   Cindee Salt MD on 07/15/2009   Method used:   Electronically to        [facility]* (retail)       [address] [place] [address]       [place], [place]  [address]       Ph: [phone]       Fax: [phone]   RxID:   [id number] CIPRO HC 0.2-1 % SUSP (CIPROFLOXACIN-HYDROCORTISONE) 3 drops into right ear two times a day  for 7 days  #1 bottle x 0   Entered and Authorized by:   Cindee Salt MD   Signed by:   Cindee Salt MD on 07/15/2009   Method used:   Electronically to        [facility] [address] (retail)       [address], [address]       [place], [place]  [address]       Ph: [phone]       Fax: [phone]   RxID:   [id number] AMOXICILLIN-POT CLAVULANATE 875-125 MG TABS (AMOXICILLIN-POT CLAVULANATE) 1 by mouth two times a day after meals for ear infection  #30 x 0   Entered and Authorized by:   Cindee Salt MD   Signed by:   Cindee Salt MD on 07/15/2009   Method used:   Electronically to        [facility] [address] (retail)       [address], [address]       [place], [place]  [address]       Ph: [phone]       Fax: [phone]   RxID:   [id number] LACTULOSE 10 GM/15ML SOLN (LACTULOSE) TAKE 3 TEASPOONFULS (1 TABLESPOONFUL) BY MOUTH TWICE DAILY  #960 mL x 12   Entered and Authorized by:   Cindee Salt MD   Signed by:   Cindee Salt MD on 07/15/2009   Method used:   Electronically to         [facility] [address] (retail)       [address], [address]       [place], [place]  [address]  Ph: [phone]       Fax: [phone]   RxID:   [id number]   Current Allergies (reviewed today): No known allergies

## 2010-05-25 NOTE — Letter (Signed)
Summary: New Patient letter  [facility]  [address] [place], [place] [address]   Phone: [phone]  Fax: [phone]       06/22/2009 MRN: [id number]  [facility] [address] [place], [place]  [address]  Dear Mr. Wolfrey,  Welcome to the Gastroenterology Division at [facility].    You are scheduled to see Dr. Marina Goodell on 07/30/2009 at 9:30AM on the 3rd floor at [facility], [address]  We ask that you try to arrive at our office 15 minutes prior to your appointment time to allow for check-in.  We would like you to complete the enclosed self-administered evaluation form prior to your visit and bring it with you on the day of your appointment.  We will review it with you.  Also, please bring a complete list of all your medications or, if you prefer, bring the medication bottles and we will list them.  Please bring your insurance card so that we may make a copy of it.  If your insurance requires a referral to see a specialist, please bring your referral form from your primary care physician.  Co-payments are due at the time of your visit and may be paid by cash, check or credit card.     Your office visit will consist of a consult with your physician (includes a physical exam), any laboratory testing he/she may order, scheduling of any necessary diagnostic testing (e.g. x-ray, ultrasound, CT-scan), and scheduling of a procedure (e.g. Endoscopy, Colonoscopy) if required.  Please allow enough time on your schedule to allow for any/all of these possibilities.    If you cannot keep your appointment, please call [phone] to cancel or reschedule prior to your appointment date.  This allows [place] the opportunity to schedule an appointment for another patient in need of care.  If you do not cancel or reschedule by 5 p.m. the business day prior to your appointment date, you will be charged a $50.00 late cancellation/no-show fee.    Thank you for choosing  [facility] for your medical needs.  We appreciate the opportunity to care for you.  Please visit [place] at our website  to learn more about our practice.                     Sincerely,                                                             The Gastroenterology Division

## 2010-05-25 NOTE — Letter (Signed)
Summary: [facility] Instructions  [facility]  [address] [place], [place] [address]   Phone: [phone]  Fax: [phone]       [name] [name]    October 09, 1950    MRN: [id number]        Procedure Day /Date:MONDAY, 09/14/09     Arrival Time:12:30 PM      Procedure Time:1:30 PM     Location of Procedure:                    X  [facility] (4th Floor)   PREPARATION FOR COLONOSCOPY WITH MOVIPREP/ENDO   Starting 5 days prior to your procedure 09/09/09 do not eat nuts, seeds, popcorn, corn, beans, peas,  salads, or any raw vegetables.  Do not take any fiber supplements (e.g. Metamucil, Citrucel, and Benefiber).  THE DAY BEFORE YOUR PROCEDURE         DATE: 09/13/09  DAY: SUNDAY  1.  Drink clear liquids the entire day-NO SOLID FOOD  2.  Do not drink anything colored red or purple.  Avoid juices with pulp.  No orange juice.  3.  Drink at least 64 oz. (8 glasses) of fluid/clear liquids during the day to prevent dehydration and help the prep work efficiently.  CLEAR LIQUIDS INCLUDE: Water Jello Ice Popsicles Tea (sugar ok, no milk/cream) Powdered fruit flavored drinks Coffee (sugar ok, no milk/cream) Gatorade Juice: apple, white grape, white cranberry  Lemonade Clear bullion, consomm, broth Carbonated beverages (any kind) Strained chicken noodle soup Hard Candy                             4.  In the morning, mix first dose of MoviPrep solution:    Empty 1 Pouch A and 1 Pouch B into the disposable container    Add lukewarm drinking water to the top line of the container. Mix to dissolve    Refrigerate (mixed solution should be used within 24 hrs)  5.  Begin drinking the prep at 5:00 p.m. The MoviPrep container is divided by 4 marks.   Every 15 minutes drink the solution down to the next mark (approximately 8 oz) until the full liter is complete.   6.  Follow completed prep with 16 oz of clear liquid of your choice (Nothing red or purple).   Continue to drink clear liquids until bedtime.  7.  Before going to bed, mix second dose of MoviPrep solution:    Empty 1 Pouch A and 1 Pouch B into the disposable container    Add lukewarm drinking water to the top line of the container. Mix to dissolve    Refrigerate  THE DAY OF YOUR PROCEDURE      DATE: 09/16/09 DAY: MONDAY  Beginning at 8:30 a.m. (5 hours before procedure):         1. Every 15 minutes, drink the solution down to the next mark (approx 8 oz) until the full liter is complete.  2. Follow completed prep with 16 oz. of clear liquid of your choice.    3. You may drink clear liquids until 11:30 AM (2 HOURS BEFORE PROCEDURE).   MEDICATION INSTRUCTIONS  Unless otherwise instructed, you should take regular prescription medications with a small sip of water   as early as possible the morning of your procedure.          OTHER INSTRUCTIONS  You will need a responsible adult at least 59 years  of age to accompany you and drive you home.   This person must remain in the waiting room during your procedure.  Wear loose fitting clothing that is easily removed.  Leave jewelry and other valuables at home.  However, you may wish to bring a book to read or  an iPod/MP3 player to listen to music as you wait for your procedure to start.  Remove all body piercing jewelry and leave at home.  Total time from sign-in until discharge is approximately 2-3 hours.  You should go home directly after your procedure and rest.  You can resume normal activities the  day after your procedure.  The day of your procedure you should not:   Drive   Make legal decisions   Operate machinery   Drink alcohol   Return to work  You will receive specific instructions about eating, activities and medications before you leave.    The above instructions have been reviewed and explained to me by   _______________________    I fully understand and can verbalize these instructions  _____________________________ Date _________

## 2010-05-25 NOTE — Miscellaneous (Signed)
Summary: [facility]  Clinical Lists Changes  Orders: Added new Test order of TLB-H Pylori Screen Gastric Biopsy (83013-CLOTEST) - Signed 

## 2010-06-07 ENCOUNTER — Telehealth: Payer: Self-pay | Admitting: Cardiology

## 2010-06-16 NOTE — Progress Notes (Signed)
Summary: blood work  Phone Note Call from Patient Call back at [employer] [phone]   Caller: Patient Reason for Call: Talk to Nurse Summary of Call: pt has question re blood work. pt wants to know does he need to have blood work done again. Initial call taken by: Roe Coombs,  June 07, 2010 1:02 PM  Follow-up for Phone Call        spoke with pt wife, script mailed to pt for him to have lipid and liver at the [place] office. Deliah Goody, RN  June 07, 2010 5:23 PM     Prescriptions: SIMVASTATIN 80 MG TABS (SIMVASTATIN) Take one half  tablet by mouth daily at bedtime  #30 x 12   Entered by:   Deliah Goody, RN   Authorized by:   Ferman Hamming, MD, [facility]   Signed by:   Deliah Goody, RN on 06/07/2010   Method used:   Electronically to        [facility] [address] (retail)       [address]       [place], [place]  [address]       Ph: [phone]       Fax: [phone]   RxID:   [id number] SIMVASTATIN 80 MG TABS (SIMVASTATIN) Take one half  tablet by mouth daily at bedtime  #30 x 12   Entered by:   Deliah Goody, RN   Authorized by:   Ferman Hamming, MD, [facility]   Signed by:   Deliah Goody, RN on 06/07/2010   Method used:   Electronically to        [facility] [address] (retail)       [address]       [place], [place]  [address]       Ph: [phone]       Fax: [phone]   RxID:   [id number]

## 2010-07-02 ENCOUNTER — Telehealth: Payer: Self-pay | Admitting: Cardiology

## 2010-07-06 NOTE — Progress Notes (Signed)
Summary: Metoprolol 90day  Phone Note Refill Request Call back at Home Phone [phone] Message from:  Patient on July 02, 2010 9:40 AM  Refills Requested: Medication #1:  METOPROLOL TARTRATE 25 MG TABS Take 1 tablet by mouth twice a day [facility] [address]   Method Requested: Fax to Local Pharmacy Initial call taken by: Lorne Skeens,  July 02, 2010 9:41 AM  Follow-up for Phone Call        Rx called to pharmacy, changed pt's 30 day supply to a 90 day. Follow-up by: Caralee Ates CMA,  July 02, 2010 2:04 PM  Additional Follow-up for Phone Call Additional follow up Details #1::        Called pt to advise him his new 90day supply had been called into his pharmacy Additional Follow-up by: Caralee Ates CMA,  July 02, 2010 2:05 PM    Prescriptions: METOPROLOL TARTRATE 25 MG TABS (METOPROLOL TARTRATE) Take 1 tablet by mouth twice a day  #180 x 1   Entered by:   Caralee Ates CMA   Authorized by:   Ferman Hamming, MD, [facility]   Signed by:   Caralee Ates CMA on 07/02/2010   Method used:   Telephoned to ...       [facility] [address] (retail)       [address]       [place], [place]  [address]       Ph: [phone]       Fax: [phone]   RxID:   [id number]

## 2010-08-18 ENCOUNTER — Telehealth: Payer: Self-pay | Admitting: Cardiology

## 2010-08-18 NOTE — Telephone Encounter (Signed)
Status of appointment with Dr. Jens Som.  S/p blockage in neck.

## 2010-08-18 NOTE — Telephone Encounter (Signed)
Spoke with pt, he is due for carotid in may and labs and see dr Jens Som in oct [name] [name]

## 2010-09-07 NOTE — Assessment & Plan Note (Signed)
[facility]                            CARDIOLOGY OFFICE NOTE   NAME:Newsom, PHUOC HUY                        MRN:          [id number]  DATE:11/01/2006                            DOB:          [birth year]    PRIMARY CARE PHYSICIAN:  Dr. Tillman Abide.   REASON FOR VISIT:  Cardiac followup.   HISTORY OF PRESENT ILLNESS:  Mr. Juhasz continues to do well without any  significant angina or dyspnea.  He reports tolerating his medicines  except that initially he was having some pain in his right arm on Zocor.  He thought that the medicine may have been related and started taking  vitamin B supplements, although was able to continue his Zocor and noted  improvement back to baseline.  He has not yet had followup lipids or  liver function tests.  Otherwise, he has been symptomatically stable,  and his electrocardiogram today is normal showing sinus rhythm at 77  beats per minute.   ALLERGIES:  No known drug allergies.   PRESENT MEDICATIONS:  1. Lumigan eye drops.  2. Plavix 75 mg p.o. daily.  3. Zocor 80 mg p.o. nightly.  4. Metoprolol 25 mg p.o. b.i.d.  5. Aspirin 325 mg p.o. daily.  6. Multivitamin daily.  7. Sublingual nitroglycerin 0.4 mg p.r.n.   REVIEW OF SYSTEMS:  As described in the history of present illness.   PHYSICAL EXAMINATION:  Blood pressure is 110/78, heart rate is 77, the  weight is 193 pounds, which is down 7 pounds from his last visit.  The patient is comfortable and in no acute distress.  HEENT:  Normal.  NECK:  Supple, no elevated jugular venous pressure or loud bruits.  LUNGS:  Clear without labored breathing.  CARDIAC EXAM:  Reveals a regular rate and rhythm, no loud murmur or  gallop.  ABDOMEN: Soft, nontender.  EXTREMITIES:  Show no pitting edema.   IMPRESSION/RECOMMENDATIONS:  1. Coronary artery disease, status post bare metal stent placement to      the proximal left anterior descending with residual 75% right  coronary artery stenosis being managed medically.  The patient is      symptomatically stable, and we will plan to continue his present      medications.  He needs to have followup liver function tests and      lipids which we will      arrange.  He will plan to follow up with [place] over the next 6 months      for symptom review.  2. Further plans to follow.     Jonelle Sidle, MD  Electronically Signed    SGM/MedQ  DD: 11/01/2006  DT: 11/02/2006  Job #: [id number]   cc:   Karie Schwalbe, MD

## 2010-09-07 NOTE — Assessment & Plan Note (Signed)
[facility]                            CARDIOLOGY OFFICE NOTE   NAME:Seier, AADITH RAUDENBUSH                        MRN:          [id number]  DATE:06/11/2007                            DOB:          1950-09-07    PRIMARY CARE PHYSICIAN:  Karie Schwalbe, MD   REASON FOR VISIT:  Cardiology followup.   HISTORY OF PRESENT ILLNESS:  Mr. Kinzler comes in for a routine visit.  He  is not reporting any angina or breathlessness.  Electrocardiogram was  overall stable showing sinus rhythm with occasional premature atrial  complexes.  He has been compliant with his medications by report.  Blood  work back in August of last year showed an LDL cholesterol of 103.   ALLERGIES:  NO KNOWN DRUG ALLERGIES.   MEDICATIONS:  1. Lumigan eye drops.  2. Plavix 75 mg p.o. daily.  3. Zocor 80 mg p.o. nightly.  4. Metoprolol 25 mg p.o. b.i.d.  5. Aspirin 325 mg p.o. daily.  6. Multivitamin daily.,  7. Nitroglycerin 0.4 mg sublingual p.r.n. (has not needed).   REVIEW OF SYSTEMS:  As described in History of Present Illness.  Otherwise, negative.   PHYSICAL EXAMINATION:  Blood pressure 129/83, heart rate is 73, weight  is 193 pounds which stable.  The patient is comfortable and in no acute  distress.  Examination neck reveals no elevated jugular venous pressure; no loud  bruits.  Lungs are clear without labored breathing.  Cardiac reveals a regular rate and rhythm.  No loud murmur or gallop.  ABDOMEN:  Soft, nontender.  EXTREMITIES:  Show no pitting edema.   IMPRESSIONS AND RECOMMENDATIONS:  Coronary artery disease status post  bare metal stent placement to the proximal left anterior descending with  medically managed 75% right coronary disease.  The patient is having no  angina on medical therapy.  Electrocardiogram is  overall stable.  Will plan a routine follow-up in 6 months for symptom  review.  We talked about limiting saturated fat and cholesterol and diet  and also  continuing Zocor at the present dose.     Jonelle Sidle, MD  Electronically Signed    SGM/MedQ  DD: 06/11/2007  DT: 06/11/2007  Job #: [id number]   cc:   Karie Schwalbe, MD

## 2010-09-10 NOTE — Assessment & Plan Note (Signed)
[facility] OFFICE NOTE   NAME:Hert, EMAAD NANNA                        MRN:          [id number]  DATE:05/02/2006                            DOB:          [birth year]    PRIMARY CARE PHYSICIAN:  Dr. Tillman Abide.   REASON FOR VISIT:  Cardiac follow up.   HISTORY OF PRESENT ILLNESS:  I saw Mr. Walck back in December.  His  history is detailed in my previous note.  He continues to do well  without any active angina or limiting dyspnea.  He has completed his  course of Plavix and is now actually taking aspirin at 325 mg daily  without any obvious bruising or bleeding problems.  We did check a  followup CBC which was normal.  I reviewed his medications today, which  he is tolerating.   ALLERGIES:  No known drug allergies.   PRESENT MEDICATIONS:  1. Lumigan eye drops.  2. Zocor 80 p.o. q.h.s.  3. Toprol 25 mg p.o. daily.  4. Aspirin 325 mg p.o. daily  5. Sublingual nitroglycerin 0.4 mg p.r.n.   REVIEW OF SYSTEMS:  As described in history of present illness.   EXAMINATION:  VITAL SIGNS:  Blood pressure is 116/70, heart rate 80,  weight is 202 pounds.  GENERAL;  The patient is comfortable, in no acute distress.  NECK:  Normal, no jugular venous pressure, no bruits.  LUNGS:  Clear without labored breathing.  CARDIAC:  Regular rate and rhythm without rub, murmur, or gallop.  EXTREMITIES:  Show no significant pitting edema.   IMPRESSION/RECOMMENDATION:  1. Coronary disease, status post bare metal stent placement to the      proximal left anterior descending with medically managed 75%      stenosis in the right coronary artery with normal ejection      fraction.  Will continue medical therapy and see the patient back      for symptom review in the next 6 months.  I would anticipate a      followup lipid profile and liver function tests at that time.  He      is tolerating the present dose of aspirin.  I have asked  him to let      [place] know if he has any problems with progressive bruising or obvious      bleeding.  His followup      CBC was normal.  2. Further plans to followup.     Jonelle Sidle, MD  Electronically Signed    SGM/MedQ  DD: 05/02/2006  DT: 05/02/2006  Job #: [id number]   cc:   Karie Schwalbe, MD

## 2010-09-10 NOTE — Discharge Summary (Signed)
[name], [name]                 ACCOUNT NO.:  [id number]   MEDICAL RECORD NO.:  [id number]          PATIENT TYPE:  INP   LOCATION:  6522                         FACILITY:  [facility]   PHYSICIAN:  Veverly Fells. Excell Seltzer, MD  DATE OF BIRTH:  10-26-1950   DATE OF ADMISSION:  03/17/2006  DATE OF DISCHARGE:  03/18/2006                                 DISCHARGE SUMMARY   PRIMARY CARDIOLOGIST:  Jonelle Sidle, MD (new).   PRINCIPAL DIAGNOSES:  1. Severe single-vessel coronary artery disease.      a.     Status post bare metal stenting, high-grade proximal left       anterior descending artery.      b.     Moderate residual right coronary artery disease.      c.     Normal left ventricular function.   SECONDARY DIAGNOSES:  1. Hyperlipidemia.  2. Tobacco.  3. Gastroesophageal reflux disease/Barrett's esophagus.   REASON FOR ADMISSION:  [name] [name] is a 60 year old male with no prior cardiac  history but with multiple cardiac risk factors, who had a recent evaluation  for chest pain and was referred for an outpatient stress test 2 days prior  to admission.  This was interpreted as abnormal for ischemia and the patient  was referred to Dr. Simona Huh in the clinic for early evaluation.  However, he continued to report worsening symptoms as well as rest angina,  and recommendation by Dr. Diona Browner was to proceed with direct admission for  further evaluation with coronary angiography.   HOSPITAL COURSE:  The patient underwent   HOSPITAL COURSE:  The patient underwent same-day cardiac catheterization  performed by Dr. Tonny Bollman (see report for full details), revealing a  high-grade ulcerated plaque lesion of the proximal left anterior descending  artery.  This was successfully treated with bare metal stenting, with no  noted complications.   Residual anatomy notable for a 75% mid right coronary artery lesion.  Dr.  Excell Seltzer recommended a follow-up stress test in 6-8 weeks for assessment  of  this territory if patient has recurrent symptoms.   The patient was kept for overnight observation and cleared for discharge the  following morning by Dr. Excell Seltzer, in hemodynamically stable condition.  Of  note, he did have a slight bump of his MB (14/9.4%) but with a normal  total CPK.  He was not having any chest pain either at rest or with  ambulation in the hallway.  A repeat CPK was drawn, and this showed a  downward trending of the MB (13.5/8.3%), and the patient was thus cleared  for discharge.     Due to financial constraints, attempts were made for the patient to be  discharged on low-cost medications.  He was provided with a 14-day supply of  Plavix of prior to discharge.   DISCHARGE MEDICATIONS:  1. Plavix 75 degrees (x30 days).  2. Coated aspirin 325 mg daily.  3. Zocor 80 mg q.h.s.  4. Metoprolol 25 mg b.i.d.  5. Lumigan eye drops as directed.  6. Nitrostat 0.4 mg as directed.  INSTRUCTIONS:  The patient is referred to cardiac catheterization discharge  sheet for full instructions.   DISCHARGE LABORATORY DATA:  CPK 150/14 (9.4%), follow-up CPK 162/13.5  (8.3+).  Sodium of 137, potassium 4.2, BUN 14, creatinine 1.0.  CBC normal.  Lipid profile:  Total cholesterol 271, triglyceride 442, HDL 32.   FOLLOW UP:  The patient is instructed to follow up with Dr. Simona Huh in  2 weeks at the [facility] clinic.  Arrangements will be made through our  office.   DISPOSITION:  Stable.      Gene Serpe, PA-C      Veverly Fells. Excell Seltzer, MD  Electronically Signed    GS/MEDQ  D:  03/18/2006  T:  03/18/2006  Job:  [id number]   cc:   Satira Anis

## 2010-09-10 NOTE — Cardiovascular Report (Signed)
[name] [name], [name] [name]                 ACCOUNT NO.:  [id number]   MEDICAL RECORD NO.:  [id number]          PATIENT TYPE:  INP   LOCATION:  6522                         FACILITY:  [facility]   PHYSICIAN:  Veverly Fells. Excell Seltzer, MD  DATE OF BIRTH:  1950-06-19   DATE OF PROCEDURE:  03/17/2006  DATE OF DISCHARGE:                            CARDIAC CATHETERIZATION   PROCEDURE:  Heart catheterization, selective coronary angiography, left  ventricular angiography, PTCA and stenting of the proximal LAD and Angio-  Seal of the right femoral artery.   INDICATIONS:  Mr. Xia is a 60 year old gentleman who presented to the  office today with classic symptoms of unstable angina.  He has undergone  a recent Myoview study that demonstrated anteroseptal and apical  ischemia.  He has multiple cardiovascular risk factors.  In the setting  of his typical history with classic symptoms of unstable angina, he was  brought to the hospital by EMS and referred for urgent cardiac  catheterization.   Procedural details: Risks and indications of the procedure were  explained in detail to the patient.  Informed consent was obtained.  The  right groin was prepped, draped and anesthetized with 1% lidocaine under  normal sterile conditions.  Using a modified Seldinger technique, a 6-  [place] arterial sheath was placed in the right femoral artery.  Multiple  angiographic views of the left and right coronary arteries were taken.  Following selective coronary angiography, an angled pigtail catheter was  inserted into the left ventricle and left ventricular pressures were  recorded.  A pullback across the aortic valve was done.  Following the  diagnostic portion of the procedure, the patient was found to have a  very high grade ulcerated plaque in the proximal LAD.  I elected to  proceed with percutaneous coronary intervention.   A 6-French CLS 3.5 mm guiding catheter was used.  The patient was given  600 mg of Plavix on the  cath lab table.  Angiomax was used for  anticoagulation.  Once a therapeutic ACT was achieved, a [place]  coronary guidewire was passed beyond the lesion into the distal LAD.  The lesion was pre-dilated with a 2.5 x 20 mm maverick balloon.  Following balloon dilatation, the lesion appeared to be very compliant.  A 3.5 x 24 mm Liberte stent was placed back to the LAD ostium and was  used to treat the lesion.  The stent was deployed at 14 atmospheres and  appeared well-expanded with no residual stenosis.  Following stent  deployment, the proximal portion of the stent had a small waist present  and I elected to post-dilate that portion of the stent with a 3.75 mm  noncompliant balloon.  This was inflated to 20 atmospheres pressure.   Following PCI, there was an excellent angiographic result.  There was a  small septal perforator that appeared jailed.  There was slow flow to  that small vessel.  The patient did have some chest discomfort and was  treated with Fentanyl.   At the conclusion of the procedure, a 6-French Angio-Seal was used to  seal the  right femoral artery.   FINDINGS:  Aortic pressure 127/72 with a mean of 96; left ventricular  pressure 130/4 with an end-diastolic pressure of 15.   Coronary angiography, left mainstem is angiographically normal.  It  bifurcates into the LAD and left circumflex.  The LAD is a large  diameter vessel throughout its proximal course.  There is a long 95%  stenosis with ulcerated plaque present in the proximal vessel.  It is a  complex-appearing lesion.  Beyond the lesion, the vessel is a large  diameter vessel in the range of 3.5 mm.  At the branching point of the  first diagonal, the LAD changes in caliber.  It is a medium caliber  vessel beyond that point.  There is a 40% stenosis of the branch point  of the first diagonal and the mid LAD.  There is also 30% stenosis of  the first diagonal vessel at its ostium.  Both of these lesions appear   nonobstructive.  The remainder of the LAD is free of any significant  angiographic disease.   The left circumflex gives off a first marginal branch very early in its  course that supplies an intermediate territory.  There is no significant  angiographic disease in that vessel.  Left circumflex is medium caliber  and courses down the AV groove giving off a posterolateral branch.  There is no significant angiographic disease throughout the left  circumflex system.   The right coronary artery appears dominant.  There is diffuse disease  throughout.  There is a 75% stenosis in the mid right coronary artery.  The proximal vessel has 30% stenoses.  The distal vessel has a 40%  stenosis and a PDA branch has diffuse disease.   Left ventriculogram demonstrates normal left ventricular function with  an estimated left ventricular ejection fraction of 55%.   ASSESSMENT:  1. Severe single-vessel coronary artery disease with high-grade      proximal left anterior descending stenosis.  2. Moderate right coronary artery disease.  3. Normal left circumflex.  4. Normal LV function.   PLAN:  As described above, PCI to the proximal LAD with a Liberte bare  metal stent was performed.  There was an excellent angiographic result  with TIMI 3 flow following the procedure.  The patient will require  lifelong aspirin and 30 days of clopidogrel therapy.  He will continue  with aggressive risk factor modification with high-dose statin therapy  which was just started as well as beta blocker therapy.  If he has  recurrent chest pain, it would be reasonable to perform a stress test  and consider intervention on the right coronary artery.  However, I  suspect he will do well with medical therapy following treatment of his  proximal LAD.      Veverly Fells. Excell Seltzer, MD  Electronically Signed     MDC/MEDQ  D:  03/18/2006  T:  03/18/2006  Job:  [id number]  cc:   Jonelle Sidle, MD

## 2010-09-10 NOTE — Assessment & Plan Note (Signed)
[facility] OFFICE NOTE   NAME:[name], [name] ISHAM                        MRN:          [id number]  DATE:04/07/2006                            DOB:          11-22-1950    PRIMARY CARE PHYSICIAN:  Karie Schwalbe, M.D.   REASON FOR VISIT:  Follow up hospitalization.   HISTORY OF PRESENT ILLNESS:  I saw [name] [name] back on March 17, 2006.  He presented at that time with unstable angina, and an abnormal  myocardial perfusion study and electrocardiogram suggesting left  anterior descending disease.  I admitted him urgently to the hospital  with same-day cardiac catheterization by Dr. Excell Seltzer revealing a 95%  ulcerated proximal left anterior descending stenosis that was the  culprit lesion.  This was treated with a bare metal stent, with good  angiographic result.  He had residual 75% right coronary artery disease  which was managed medically.  His ejection fraction was normal at 55%.  He was discharged on March 18, 2006 and has actually done fairly  well.  He has noted some problems with general weakness and fatigue,  although no chest pain or shortness of breath.  He has resumed his  previous activity level.  He tells parenthetically that he had some  problems with low platelets in the past while taking a significant  amount of ibuprofen.  He was concerned that perhaps his present anti-  platelet regimen was a problem, and he has held his aspirin for the last  few days.  He has had some bruising.  His electrocardiogram shows sinus  rhythm at 62 beats per minute.   ALLERGIES:  NO KNOWN DRUG ALLERGIES.   CURRENT MEDICATIONS:  1. Lumigan eye drops.  2. Plavix 75 mg p.o. daily.  3. Enteric-coated aspirin 325 mg p.o. daily (on hold).  4. Zocor 80 mg p.o. q.h.s.  5. Metoprolol 25 mg p.o. b.i.d.  6. Sublingual nitroglycerin 0.4 mg p.r.n.   REVIEW OF SYSTEMS:  As described in the history of present illness.  Otherwise negative.   PHYSICAL EXAMINATION:  VITAL SIGNS:  Blood pressure 114/78, heart rate  62.  Weight is 200 pounds.  GENERAL:  The patient is comfortable and in no acute distress.  HEENT:  Conjunctivae and lids normal.  Oropharynx is clear.  NECK:  Supple without elevated jugular venous pressure or loud bruits.  No thyromegaly is noted.  LUNGS:  Clear without labored breathing at rest.  CARDIAC:  Regular rate and rhythm without loud murmur or gallop.  EXTREMITIES:  Right groin site with healing ecchymosis.  No lower  extremity edema.  Distal pulses are 2+.   IMPRESSION AND RECOMMENDATIONS:  1. Coronary artery disease status post bare metal stent placement to a      severe proximal left anterior descending stenosis with otherwise      residual 75% stenosis in the right coronary artery being managed      medically in the setting of a normal ejection fraction.  My plan at      this point will be for him  to resume aspirin at 81 mg daily and      continue Plavix.  Optimally, I would at least like for him to stay      on this combination for a period of a month following his      intervention which was on March 17, 2006 to reduce the chance of      stent thrombosis.  Will check a complete blood count to evaluate      his hemoglobin and platelets.  It may well be that we need to      simplify his anti-platelet regimen longterm.  Otherwise, I will not      make any medication changes and have him follow up within the next      month.  2. Further plans to follow.     Jonelle Sidle, MD  Electronically Signed    SGM/MedQ  DD: 04/07/2006  DT: 04/07/2006  Job #: [id number]   cc:   Karie Schwalbe, MD

## 2010-09-10 NOTE — H&P (Signed)
[facility] ADMISSION   NAME:[name] [name], [name] [name]                        MRN:          [id number]  DATE:03/17/2006                            DOB:          1950-09-30    PRIMARY CARE PHYSICIAN:  Dr. Tillman Abide.   REASON FOR ADMISSION:  Unstable angina.   HISTORY OF PRESENT ILLNESS:  Mr. Pecore is a pleasant 60 year old male with  available history indicating tobacco use (recently quit last month),  hyperlipidemia, and gastroesophageal reflux disease with previously  documented Barrett's esophagus.  He has been experiencing a 3-4 week history  of progressive exertional chest pressure, as well as exertional fatigue.  My understanding is that he was scheduled for an outpatient Myoview through  [facility] by Dr. Karle Starch office and this procedure was performed  on November 21 and interpreted by Dr. Myrtis Ser.  The study revealed evidence of  septal and apical ischemia with abnormal electrocardiographic changes and  chest pain.  Ejection fraction was 52% with wall motion abnormality at the  apex.  The patient was apparently contacted and scheduled to come into the  office to see me today.  In speaking with him, he has had worsening symptoms  over the last 2 days, including some rest chest pain last night and this  morning on his way to the office.  He was placed on metoprolol and  sublingual nitroglycerine recently following this stress test and has used  nitroglycerine with relief of pain.  In the office today, he is chest pain  free.  His electrocardiogram demonstrates sinus rhythm with inferior and  anterolateral T-wave wave inversions, question Wellens' T-waves.  I  discussed the situation with the patient and his wife.  I have recommended  transfer via EMS to [facility] for admission and potentially  diagnostic cardiac catheterization later this afternoon.  After discussing  the potential risks and  benefits of this, he is in agreement to proceed.   ALLERGIES:  No reported drug allergies.   CURRENT MEDICATIONS:  1. Aspirin 81 mg p.o. daily.  2. Lumigan eye drops one daily.  3. Metoprolol 25 mg p.o. daily.  4. Sublingual 0.4 mg p.r.n.   PAST MEDICAL HISTORY:  As outlined above.  He also apparently has a history  of colonic polyps, glaucoma, previous umbilical hernia as an infant, and  possibly COPD.   SOCIAL HISTORY:  The patient is married, has a longstanding tobacco use  history but quit one month ago.  He works in an Event organiser  business.  He lives in [place].   FAMILY HISTORY:  Noncontributory for obvious premature cardiovascular  disease.   REVIEW OF SYSTEMS:  As in history of present illness.  He does experience  occasional reflux symptoms.  He has no claudication or syncope.   PHYSICAL EXAMINATION:  Blood pressure is 128/78, heart rate is 65, weight is  195 pounds.  The patient is comfortable at this point, in no acute distress,  without active chest pain.  HEENT:  Conjunctiva is normal, pharynx is  clear.  NECK:  Supple, without jugular venous pressure or bruits.  No thyromegaly is  noted.  LUNGS:  Clear without labored breathing.  CARDIAC EXAM:  Reveals a regular rate and rhythm with soft S4, no loud  murmur, S3, gallop, no pericardial rub.  ABDOMEN:  Soft, normoactive bowel sounds.  No tenderness to palpation or  hepatomegaly.  EXTREMITIES:  No significant pitting edema.  Distal pulses are 2+.  Skin: No  ulcer changes noted.  Musculoskeletal:  No kyphosis noted.  NEUROPSYCHIATRIC:  The patient is alert and oriented x3.  Affect is normal.   IMPRESSION/RECOMMENDATIONS:  1. Unstable angina in a 60 year old male with prior tobacco use,      hyperlipidemia, and recent abnormal Myoview suggesting septal and      apical ischemia.  His electrocardiogram in the absence of chest pain      shows probable Wellens' T-waves and I would suspect at least a  proximal      left anterior descending stenosis.  His ejection fraction was 52% on      Myoview.  I reviewed the situation in detail with the patient and his      wife and have recommended a diagnostic cardiac catheterization.  The      patient will be admitted via EMS to the hospital today in anticipation      of cardiac catheterization later this afternoon.  He had a small lunch      approximately an hour ago and will kept NPO in the meanwhile.  Stat      labs and a chest x-ray will be obtained.  Will continue aspirin and      beta blocker therapy, with the addition of Heparin.  A fasting lipid      profile will also be obtained and I would anticipate he needs Statin      therapy as well, which we will go ahead and initiate.  I spoke with Dr.      Excell Seltzer in the cath lab about these plans.  2. Further recommendations to follow.     Jonelle Sidle, MD  Electronically Signed    SGM/MedQ  DD: 03/17/2006  DT: 03/17/2006  Job #: [id number]   cc:   Karie Schwalbe, MD

## 2010-09-15 ENCOUNTER — Other Ambulatory Visit: Payer: Self-pay | Admitting: Cardiology

## 2010-09-15 DIAGNOSIS — I6529 Occlusion and stenosis of unspecified carotid artery: Secondary | ICD-10-CM

## 2010-09-17 ENCOUNTER — Encounter (INDEPENDENT_AMBULATORY_CARE_PROVIDER_SITE_OTHER): Payer: Self-pay | Admitting: Cardiology

## 2010-09-17 ENCOUNTER — Other Ambulatory Visit: Payer: Self-pay | Admitting: Internal Medicine

## 2010-09-17 DIAGNOSIS — I6529 Occlusion and stenosis of unspecified carotid artery: Secondary | ICD-10-CM

## 2010-09-17 DIAGNOSIS — R609 Edema, unspecified: Secondary | ICD-10-CM

## 2011-01-10 ENCOUNTER — Telehealth: Payer: Self-pay | Admitting: Cardiology

## 2011-01-10 MED ORDER — METOPROLOL TARTRATE 25 MG PO TABS: 25.0000 mg | ORAL_TABLET | Freq: Two times a day (BID) | ORAL | Status: DC

## 2011-01-10 NOTE — Telephone Encounter (Signed)
Refill of metoprolol 25 mg sent to [facility] [address]  [phone] he wants 3 mo supply

## 2011-01-10 NOTE — Telephone Encounter (Signed)
Please call into [facility] pharmacy.

## 2011-01-26 ENCOUNTER — Encounter: Payer: Self-pay | Admitting: *Deleted

## 2011-01-26 ENCOUNTER — Encounter: Payer: Self-pay | Admitting: Cardiovascular Disease

## 2011-01-27 ENCOUNTER — Ambulatory Visit (INDEPENDENT_AMBULATORY_CARE_PROVIDER_SITE_OTHER): Payer: Self-pay | Admitting: Cardiology

## 2011-01-27 ENCOUNTER — Encounter: Payer: Self-pay | Admitting: Cardiology

## 2011-01-27 DIAGNOSIS — I679 Cerebrovascular disease, unspecified: Secondary | ICD-10-CM | POA: Insufficient documentation

## 2011-01-27 DIAGNOSIS — I10 Essential (primary) hypertension: Secondary | ICD-10-CM | POA: Insufficient documentation

## 2011-01-27 DIAGNOSIS — I2510 Atherosclerotic heart disease of native coronary artery without angina pectoris: Secondary | ICD-10-CM

## 2011-01-27 DIAGNOSIS — I779 Disorder of arteries and arterioles, unspecified: Secondary | ICD-10-CM

## 2011-01-27 DIAGNOSIS — E7800 Pure hypercholesterolemia, unspecified: Secondary | ICD-10-CM

## 2011-01-27 LAB — HEPATIC FUNCTION PANEL
ALT: 18 U/L (ref 0–53)
AST: 23 U/L (ref 0–37)
Total Bilirubin: 0.7 mg/dL (ref 0.3–1.2)
Total Protein: 7.7 g/dL (ref 6.0–8.3)

## 2011-01-27 LAB — LIPID PANEL
Cholesterol: 166 mg/dL (ref 0–200)
Total CHOL/HDL Ratio: 4

## 2011-01-27 NOTE — Assessment & Plan Note (Signed)
Continue aspirin and statin. Followup carotid Dopplers November 2012.

## 2011-01-27 NOTE — Progress Notes (Signed)
RUE:[id number]  male previously with s a history of coronary disease. He underwent cardiac catheterization in 2007 and had a bare-metal stent to his LAD. He has residual right coronary artery disease. Last Myoview in May of 2010 showed an ejection fraction of 66%. There was felt to be possible diaphragmatic attenuation but given mild reversibility could not exclude small area of inferior ischemia. We have treated medically. Carotid Dopplers in May of 2012 showed a 60 to 79% left and 40 to 59% right with followup recommended in 6 months. I last saw him in Oct 2011. Since then he has dyspnea with more extreme activities but not with routine activities. There's no associated chest pain. It is relieved with rest. No orthopnea, PND, pedal edema, palpitations, syncope or exertional chest pain.  Current Outpatient Prescriptions  Medication Sig Dispense Refill  . aspirin 325 MG tablet Take 325 mg by mouth daily.        . metoprolol tartrate (LOPRESSOR) 25 MG tablet Take 1 tablet (25 mg total) by mouth 2 (two) times daily.  180 tablet  3  . omeprazole (PRILOSEC) 20 MG capsule Take 20 mg by mouth daily.        . simvastatin (ZOCOR) 80 MG tablet Take 40 mg by mouth at bedtime.           Past Medical History  Diagnosis Date  . HYPERCHOLESTEROLEMIA   . HYPERLIPIDEMIA   . GLAUCOMA   . OTITIS EXTERNA   . CORONARY ARTERY DISEASE   . COPD   . GERD   . BARRETTS ESOPHAGUS   . Other constipation   . COLONIC POLYPS, HX OF     Past Surgical History  Procedure Date  . Umbilical hernia repair   . Esophagogastroduodenoscopy   . Coronary stent placement     History   Social History  . Marital Status: Married    Spouse Name: N/A    Number of Children: N/A  . Years of Education: N/A   Occupational History  . Not on file.   Social History Main Topics  . Smoking status: Current Everyday Smoker  . Smokeless tobacco: Not on file  . Alcohol Use: No  . Drug Use: No  . Sexually Active: Not on file    Other Topics Concern  . Not on file   Social History Narrative  . No narrative on file    ROS: no fevers or chills, productive cough, hemoptysis, dysphasia, odynophagia, melena, hematochezia, dysuria, hematuria, rash, seizure activity, orthopnea, PND, pedal edema, claudication. Remaining systems are negative.  Physical Exam: Well-developed well-nourished in no acute distress.  Skin is warm and dry.  HEENT is normal.  Neck is supple. No thyromegaly. Left carotid bruit  Chest is clear to auscultation with normal expansion.  Cardiovascular exam is regular rate and rhythm.  Abdominal exam nontender or distended. No masses palpated. Extremities show no edema. neuro grossly intact  ECG normal sinus rhythm at a rate of 60. No ST changes.

## 2011-01-27 NOTE — Assessment & Plan Note (Signed)
Continue statin. Check lipids and liver. 

## 2011-01-27 NOTE — Assessment & Plan Note (Signed)
Continue aspirin and statin. Plan Myoview when he returns in one year. 

## 2011-01-27 NOTE — Patient Instructions (Signed)
Your physician has requested that you have a carotid duplex. This test is an ultrasound of the carotid arteries in your neck. It looks at blood flow through these arteries that supply the brain with blood. Allow one hour for this exam. There are no restrictions or special instructions.  November 2012  Your physician recommends that [facility] lab work today fasting cholesterol  Your physician wants you to follow-up in: 1 year with Dr. Jens Som. You will receive a reminder letter in the mail two months in advance. If you don't receive a letter, please call our office to schedule the follow-up appointment.

## 2011-01-27 NOTE — Assessment & Plan Note (Signed)
Patient counseled on discontinuing. 

## 2011-01-27 NOTE — Assessment & Plan Note (Signed)
Blood pressure controlled. Continue present medications. 

## 2011-03-04 ENCOUNTER — Encounter (INDEPENDENT_AMBULATORY_CARE_PROVIDER_SITE_OTHER): Payer: Self-pay | Admitting: *Deleted

## 2011-03-04 DIAGNOSIS — I6529 Occlusion and stenosis of unspecified carotid artery: Secondary | ICD-10-CM

## 2011-03-10 ENCOUNTER — Other Ambulatory Visit: Payer: Self-pay | Admitting: Internal Medicine

## 2011-03-14 ENCOUNTER — Other Ambulatory Visit: Payer: Self-pay | Admitting: Internal Medicine

## 2011-03-18 ENCOUNTER — Ambulatory Visit (INDEPENDENT_AMBULATORY_CARE_PROVIDER_SITE_OTHER): Payer: Self-pay | Admitting: Family Medicine

## 2011-03-18 ENCOUNTER — Encounter: Payer: Self-pay | Admitting: Family Medicine

## 2011-03-18 ENCOUNTER — Ambulatory Visit (INDEPENDENT_AMBULATORY_CARE_PROVIDER_SITE_OTHER)
Admission: RE | Admit: 2011-03-18 | Discharge: 2011-03-18 | Disposition: A | Discharge status: Home / Self Care | Payer: Self-pay | Source: Ambulatory Visit | Attending: Family Medicine | Admitting: Family Medicine

## 2011-03-18 VITALS — BP 120/78 | HR 77 | Temp 97.9°F | Ht 71.0 in | Wt 191.0 lb

## 2011-03-18 DIAGNOSIS — M25569 Pain in unspecified knee: Secondary | ICD-10-CM

## 2011-03-18 DIAGNOSIS — S83419A Sprain of medial collateral ligament of unspecified knee, initial encounter: Secondary | ICD-10-CM

## 2011-03-18 DIAGNOSIS — M25561 Pain in right knee: Secondary | ICD-10-CM

## 2011-03-18 MED ORDER — LACTULOSE 10 GM/15ML PO SOLN: ORAL | Status: DC

## 2011-03-18 MED ORDER — DICLOFENAC SODIUM 75 MG PO TBEC: 75.0000 mg | DELAYED_RELEASE_TABLET | Freq: Two times a day (BID) | ORAL | Status: AC

## 2011-03-18 NOTE — Progress Notes (Signed)
  Patient Name: [name] [name] Date of Birth: 1950/04/27 Age: 60 y.o. Medical Record Number: [id number] Gender: male  History of Present Illness:  [name] [name] is a 60 y.o. very pleasant male patient who presents with the following:  Right knee, fell and hit the corner of his knee on the left. This was all about 3 weeks ago. All this pain is on the medial aspect of his knee. He is walking with a slight limp. He has not had any mechanical symptoms. No locking up of his joints, no symptomatic giving way. He does go up and down on his knees a lot, and works in an TEFL teacher. He has no prior fractures, no operative interventions in the affected knee. He has worn an Ace wrap, but he has had some persistent pain medially. No dramatic effusions.   Past Medical History, Surgical History, Social History, Family History, and Problem List have been reviewed in EHR and updated if relevant.  Review of Systems:  GEN: No fevers, chills. Nontoxic. Primarily MSK c/o today. MSK: Detailed in the HPI GI: tolerating PO intake without difficulty Neuro: No numbness, parasthesias, or tingling associated. Otherwise the pertinent positives of the ROS are noted above.    Physical Examination: Filed Vitals:   03/18/11 1407  BP: 120/78  Pulse: 77  Temp: 97.9 F (36.6 C)  TempSrc: Oral  Height: 5\' 11"  (1.803 m)  Weight: 191 lb (86.637 kg)  SpO2: 97%     GEN: WDWN, NAD, Non-toxic, Alert & Oriented x 3 HEENT: Atraumatic, Normocephalic.  Ears and Nose: No external deformity. EXTR: No clubbing/cyanosis/edema NEURO: Normal gait.  PSYCH: Normally interactive. Conversant. Not depressed or anxious appearing.  Calm demeanor.   Knee:  right Gait: Normal heel toe pattern ROM: 0-125 Effusion: mild Echymosis or edema: none Patellar tendon NT Painful PLICA: neg Patellar grind: negative Medial and lateral patellar facet loading: negative medial and lateral joint lines:NT Mcmurray's neg Flexion-pinch  neg Varus and valgus stress: greatest pain is with valgus stress, but there is no opening of the joint. Lachman: neg Ant and Post drawer: neg Hip abduction, IR, ER: WNL Hip flexion str: 5/5 Hip abd: 5/5 Quad: 5/5 VMO atrophy:No Hamstring concentric and eccentric: 5/5   Assessment and Plan: 1. Knee pain, right  DG Knee Complete 4 Views Right, diclofenac (VOLTAREN) 75 MG EC tablet  2. Sprain of MCL (medial collateral ligament) of knee      X-rays, for the trauma series of the knee are unremarkable.  RIGHT knee, most consistent with MCL sprain, grade 1. I suspect there is also a degree of bony contusion, and could not fully exclude a small meniscal tear given the location of palpable pain. It does seem that this is primarily with valgus stress, and he has no significant pain on McMurray's or with Apley's grind maneuver.

## 2011-03-18 NOTE — Patient Instructions (Signed)
Hinged pull-up knee brace.

## 2011-03-22 ENCOUNTER — Telehealth: Payer: Self-pay | Admitting: Internal Medicine

## 2011-03-22 NOTE — Telephone Encounter (Signed)
Spoke with patient and he already took some and it didn't make him sick so far, so he will continue, it scared his wife because of the blockage in his neck.

## 2011-03-22 NOTE — Telephone Encounter (Signed)
Call a nurse note,

## 2011-03-22 NOTE — Telephone Encounter (Signed)
okay

## 2011-03-22 NOTE — Telephone Encounter (Signed)
Patient Name: [name] [name] Call Date & Time: 03/19/2011 11:35:14AM Patient Phone: [phone] PCP: Tillman Abide Patient Gender: Male PCP Fax : [phone] Patient DOB: 01-Sep-1950 Practice Name: [facility] Reason for Call: Caller: Doylene Bode; PCP: Tillman Abide I.; CB#: [phone]; Call Reason: Med Questions about Diclofenac 75mg ; Sx Onset: 03/19/2011; Sx Notes: Pt was RX Diclofenac due to a torn ligament in knee. Spouse was concerned about warnings on medication concerning heart disease. Information was given to spouse according to Health Education. Pt is not having any symptoms.; Afebrile; Guideline Used: Office Note; Disp:; Appt Scheduled?: No Protocol(s) Used: Office Note Recommended Outcome per Protocol: Information Noted and Sent to Office Reason for Outcome: Caller information to office

## 2011-03-22 NOTE — Telephone Encounter (Signed)
Please call There is a small increased risk of coronary events on any anti-inflammatory (like aleve, advil or the diclofenac). It may still be appropriate to take if it helps his pain. He should use the lowest effective dose and stop when his pain is better

## 2011-07-12 ENCOUNTER — Other Ambulatory Visit: Payer: Self-pay | Admitting: *Deleted

## 2011-07-12 MED ORDER — SIMVASTATIN 80 MG PO TABS: 40.0000 mg | ORAL_TABLET | Freq: Every day | ORAL | Status: DC

## 2011-08-31 ENCOUNTER — Telehealth: Payer: Self-pay | Admitting: Cardiology

## 2011-08-31 NOTE — Telephone Encounter (Signed)
Spoke with pt wife, carotids scheduled.

## 2011-08-31 NOTE — Telephone Encounter (Signed)
Please return call to patient wife Britta Mccreedy [phone].  Patient had previous blockage, usually gets carotid exam every 6 months but patient has not heard anything since 02/2011 last exam.  Please return call to discuss

## 2011-09-05 ENCOUNTER — Encounter (INDEPENDENT_AMBULATORY_CARE_PROVIDER_SITE_OTHER): Payer: Self-pay

## 2011-09-05 DIAGNOSIS — I6529 Occlusion and stenosis of unspecified carotid artery: Secondary | ICD-10-CM

## 2011-09-05 DIAGNOSIS — R0989 Other specified symptoms and signs involving the circulatory and respiratory systems: Secondary | ICD-10-CM

## 2011-10-10 ENCOUNTER — Other Ambulatory Visit: Payer: Self-pay | Admitting: *Deleted

## 2011-10-10 MED ORDER — LACTULOSE 10 GM/15ML PO SOLN: ORAL | Status: DC

## 2011-10-10 NOTE — Telephone Encounter (Signed)
rx sent to pharmacy by e-script Spoke with patient and scheduled appt

## 2011-10-10 NOTE — Telephone Encounter (Signed)
Okay to fill largest bottle with 1 refill Offer OV appt

## 2011-10-10 NOTE — Telephone Encounter (Signed)
Patient's wife requesting refill of the largest available bottle. You haven't seen in him in quite a while. I'm not sure how these bottles are supplied. Please advise. Thanks!

## 2011-11-10 ENCOUNTER — Ambulatory Visit: Payer: Self-pay | Admitting: Internal Medicine

## 2011-12-24 ENCOUNTER — Other Ambulatory Visit: Payer: Self-pay | Admitting: Cardiology

## 2012-02-27 ENCOUNTER — Telehealth: Payer: Self-pay | Admitting: Cardiology

## 2012-02-27 DIAGNOSIS — I6529 Occlusion and stenosis of unspecified carotid artery: Secondary | ICD-10-CM

## 2012-02-27 NOTE — Telephone Encounter (Signed)
Spoke with pt, follow up carotid and yearly appts scheduled.

## 2012-02-27 NOTE — Telephone Encounter (Signed)
New Problem:    Patient's wife called in wanting to know when her husbands next Carotid was. Please call back.

## 2012-03-09 ENCOUNTER — Encounter (INDEPENDENT_AMBULATORY_CARE_PROVIDER_SITE_OTHER): Payer: Self-pay

## 2012-03-09 DIAGNOSIS — I6529 Occlusion and stenosis of unspecified carotid artery: Secondary | ICD-10-CM

## 2012-03-12 ENCOUNTER — Other Ambulatory Visit: Payer: Self-pay | Admitting: *Deleted

## 2012-03-12 ENCOUNTER — Telehealth: Payer: Self-pay | Admitting: Internal Medicine

## 2012-03-12 DIAGNOSIS — I6529 Occlusion and stenosis of unspecified carotid artery: Secondary | ICD-10-CM

## 2012-03-12 NOTE — Telephone Encounter (Signed)
Carotid results faxed to [facility].

## 2012-03-12 NOTE — Telephone Encounter (Signed)
New Problem:    Wanted to speak with you about a referral.  Please call back.

## 2012-03-13 ENCOUNTER — Encounter: Payer: Self-pay | Admitting: Vascular Surgery

## 2012-03-13 ENCOUNTER — Other Ambulatory Visit: Payer: Self-pay

## 2012-03-13 DIAGNOSIS — I6529 Occlusion and stenosis of unspecified carotid artery: Secondary | ICD-10-CM

## 2012-03-14 ENCOUNTER — Other Ambulatory Visit (INDEPENDENT_AMBULATORY_CARE_PROVIDER_SITE_OTHER): Payer: Self-pay | Admitting: *Deleted

## 2012-03-14 ENCOUNTER — Ambulatory Visit (INDEPENDENT_AMBULATORY_CARE_PROVIDER_SITE_OTHER): Payer: Self-pay | Admitting: Vascular Surgery

## 2012-03-14 ENCOUNTER — Encounter: Payer: Self-pay | Admitting: Vascular Surgery

## 2012-03-14 VITALS — BP 119/77 | HR 74 | Resp 16 | Ht 71.0 in | Wt 189.0 lb

## 2012-03-14 DIAGNOSIS — I779 Disorder of arteries and arterioles, unspecified: Secondary | ICD-10-CM | POA: Insufficient documentation

## 2012-03-14 DIAGNOSIS — I6529 Occlusion and stenosis of unspecified carotid artery: Secondary | ICD-10-CM

## 2012-03-14 NOTE — Assessment & Plan Note (Signed)
This patient has a 60-79% left carotid stenosis with a 40-59% right carotid stenosis. He is asymptomatic. I explained we would recommend left carotid endarterectomy at the stenosis progressed to greater than 80% or he became symptomatic. We have reviewed the symptoms of cerebrovascular disease. He will have his follow carotid duplex scan at the [facility] office and if there is any progression of his disease we would certainly be happy to see him again to consider left carotid endarterectomy. I have discussed with the patient the nature of atherosclerosis, and emphasized the importance of maximal medical management including control of blood pressure, cholesterol levels, antiplatelet agents, obtaining regular exercise, and cessation of smoking. The patient is aware that without maximal medical management the underlying atherosclerotic disease process will progress and also limit the benefit of any interventions. I did discuss the case over the phone with Dr. Jens Som today.

## 2012-03-14 NOTE — Progress Notes (Signed)
[facility] [place]  Patient name: [name] [name] MRN: [id number] DOB: [birth year] Sex: male  REASON FOR CONSULT: left carotid stenosis. Referred by Dr. Olga Millers.  HPI: [name] [name] is a 61 y.o. male who was referred with a 80% left carotid stenosis. He was found to have a carotid bruit in the past and has been having carotid ultrasounds at 6 month intervals. On his most recent study, dated 03/09/2012, this suggested in approximately 80% left carotid stenosis. End diastolic velocity on the left was 111 cm/s putting a stenosis right at 80%. He had a 40-59% right carotid stenosis.  Of note, he is asymptomatic. He is right-handed. He denies any history of stroke, TIAs, expressive or receptive aphasia, or amaurosis fugax. He does smoke 1-1/2 packs per day of cigarettes.  Past Medical History  Diagnosis Date  . HYPERCHOLESTEROLEMIA   . HYPERLIPIDEMIA   . GLAUCOMA   . OTITIS EXTERNA   . CORONARY ARTERY DISEASE   . COPD   . GERD   . BARRETTS ESOPHAGUS   . Other constipation   . COLONIC POLYPS, HX OF   . Peripheral vascular disease     History reviewed. No pertinent family history.  SOCIAL HISTORY: History  Substance Use Topics  . Smoking status: Current Every Day Smoker -- 1.5 packs/day for 47 years    Types: Cigarettes  . Smokeless tobacco: Current User    Types: Chew  . Alcohol Use: 1.2 oz/week    2 Cans of beer per week    No Known Allergies  Current Outpatient Prescriptions  Medication Sig Dispense Refill  . aspirin 325 MG tablet Take 325 mg by mouth daily.        . diclofenac (VOLTAREN) 75 MG EC tablet Take 1 tablet (75 mg total) by mouth 2 (two) times daily.  60 tablet  3  . lactulose (CHRONULAC) 10 GM/15ML solution Take 1 tablespoon by mouth once daily  1892 mL  1  . metoprolol tartrate (LOPRESSOR) 25 MG tablet TAKE ONE TABLET BY MOUTH TWICE DAILY  180 tablet  2  . omeprazole (PRILOSEC) 20 MG capsule Take 20 mg by mouth daily.        .  simvastatin (ZOCOR) 80 MG tablet Take 0.5 tablets (40 mg total) by mouth at bedtime.  30 tablet  12    REVIEW OF SYSTEMS: Arly.Keller ] denotes positive finding; [  ] denotes negative finding  CARDIOVASCULAR:  [ ]  chest pain   [ ]  chest pressure   [ ]  palpitations   [ ]  orthopnea   [ ]  dyspnea on exertion   [ ]  claudication   [ ]  rest pain   [ ]  DVT   [ ]  phlebitis PULMONARY:   [ ]  productive cough   [ ]  asthma   [ ]  wheezing NEUROLOGIC:   [ ]  weakness  [ ]  paresthesias  [ ]  aphasia  [ ]  amaurosis  [ ]  dizziness HEMATOLOGIC:   [ ]  bleeding problems   [ ]  clotting disorders MUSCULOSKELETAL:  [ ]  joint pain   [ ]  joint swelling [ ]  leg swelling GASTROINTESTINAL: [ ]   blood in stool  [ ]   hematemesis GENITOURINARY:  [ ]   dysuria  [ ]   hematuria PSYCHIATRIC:  [ ]  history of major depression INTEGUMENTARY:  [ ]  rashes  [ ]  ulcers CONSTITUTIONAL:  [ ]  fever   [ ]  chills  PHYSICAL EXAM: Filed Vitals:   03/14/12 1521 03/14/12 1522  BP:  117/74 119/77  Pulse: 74   Resp: 16   Height: 5\' 11"  (1.803 m)   Weight: 189 lb (85.73 kg)   SpO2: 98%    Body mass index is 26.36 kg/(m^2). GENERAL: The patient is a well-nourished male, in no acute distress. The vital signs are documented above. CARDIOVASCULAR: There is a regular rate and rhythm. He has a left carotid bruit. He has palpable femoral and dorsalis pedis pulses bilaterally. He has no significant lower extremity swelling. PULMONARY: There is good air exchange bilaterally without wheezing or rales. ABDOMEN: Soft and non-tender with normal pitched bowel sounds.  MUSCULOSKELETAL: There are no major deformities or cyanosis. NEUROLOGIC: No focal weakness or paresthesias are detected. SKIN: There are no ulcers or rashes noted. PSYCHIATRIC: The patient has a normal affect.  DATA:  I have reviewed his carotid duplex scan from the [facility] office. This suggested a proximal 80% left carotid stenosis with a 40-59% right carotid stenosis. Both vertebral arteries  were patent with antegrade flow.  I have independently interpreted a limited carotid duplex scan in our office assessing the left carotid artery in anticipation of possible surgery. The hiatus velocities that we could obtain was in the distal common carotid artery with peak systolic velocity of 243 cm/s with an end-diastolic velocity of 73 cm/s. Therefore I think that the stenosis is less than 80%.  MEDICAL ISSUES:  Occlusion and stenosis of carotid artery without mention of cerebral infarction This patient has a 60-79% left carotid stenosis with a 40-59% right carotid stenosis. He is asymptomatic. I explained we would recommend left carotid endarterectomy at the stenosis progressed to greater than 80% or he became symptomatic. We have reviewed the symptoms of cerebrovascular disease. He will have his follow carotid duplex scan at the [facility] office and if there is any progression of his disease we would certainly be happy to see him again to consider left carotid endarterectomy. I have discussed with the patient the nature of atherosclerosis, and emphasized the importance of maximal medical management including control of blood pressure, cholesterol levels, antiplatelet agents, obtaining regular exercise, and cessation of smoking. The patient is aware that without maximal medical management the underlying atherosclerotic disease process will progress and also limit the benefit of any interventions. I did discuss the case over the phone with Dr. Jens Som today.     Natash Berman S [facility] [place] Beeper: [phone]

## 2012-03-15 ENCOUNTER — Encounter (HOSPITAL_COMMUNITY): Payer: Self-pay

## 2012-04-13 ENCOUNTER — Encounter: Payer: Self-pay | Admitting: Cardiology

## 2012-04-13 ENCOUNTER — Ambulatory Visit (INDEPENDENT_AMBULATORY_CARE_PROVIDER_SITE_OTHER): Payer: Self-pay | Admitting: Cardiology

## 2012-04-13 VITALS — BP 110/57 | HR 76 | Wt 192.0 lb

## 2012-04-13 DIAGNOSIS — I679 Cerebrovascular disease, unspecified: Secondary | ICD-10-CM

## 2012-04-13 DIAGNOSIS — I2510 Atherosclerotic heart disease of native coronary artery without angina pectoris: Secondary | ICD-10-CM

## 2012-04-13 DIAGNOSIS — I10 Essential (primary) hypertension: Secondary | ICD-10-CM

## 2012-04-13 DIAGNOSIS — E7800 Pure hypercholesterolemia, unspecified: Secondary | ICD-10-CM

## 2012-04-13 NOTE — Assessment & Plan Note (Signed)
Continue statin. Check lipids and liver. 

## 2012-04-13 NOTE — Patient Instructions (Addendum)
Your physician wants you to follow-up in: ONE YEAR WITH DR Shelda Pal will receive a reminder letter in the mail two months in advance. If you don't receive a letter, please call our office to schedule the follow-up appointment.   Your physician has requested that you have a carotid duplex. This test is an ultrasound of the carotid arteries in your neck. It looks at blood flow through these arteries that supply the brain with blood. Allow one hour for this exam. There are no restrictions or special instructions. SCHEDULE IN MAY  Your physician has requested that you have en exercise stress myoview. For further information please visit [email or web address] Please follow instruction sheet, as given.   Your physician recommends that you return for lab work WITH STRESS TEST

## 2012-04-13 NOTE — Assessment & Plan Note (Signed)
>>  ASSESSMENT AND PLAN FOR HYPERCHOLESTEROLEMIA WRITTEN ON 04/13/2012  4:15 PM BY CRENSHAW, REDELL RAMAN, MD  Continue statin. Check lipids and liver.

## 2012-04-13 NOTE — Assessment & Plan Note (Signed)
Continue aspirin and statin. Schedule Myoview for risk stratification. 

## 2012-04-13 NOTE — Assessment & Plan Note (Signed)
Blood pressure controlled. 

## 2012-04-13 NOTE — Assessment & Plan Note (Signed)
Patient counseled on discontinuing. 

## 2012-04-13 NOTE — Progress Notes (Signed)
   HPI: Pleasant male previously with s a history of coronary disease. He underwent cardiac catheterization in 2007 and had a bare-metal stent to his LAD. He has residual right coronary artery disease. Last Myoview in May of 2010 showed an ejection fraction of 66%. There was felt to be possible diaphragmatic attenuation but given mild reversibility could not exclude small area of inferior ischemia. We have treated medically. Carotid Dopplers in Nov 13 showed a 80 to 99% left and 40 to 59% right. Patient was seen by Dr. Edilia Bo and observation recommended. We will plan followup Dopplers in May of 2014. Since he was last seen, the patient denies any dyspnea on exertion, orthopnea, PND, pedal edema, palpitations, syncope or chest pain.   Current Outpatient Prescriptions  Medication Sig Dispense Refill  . aspirin 325 MG tablet Take 325 mg by mouth daily.        Marland Kitchen lactulose (CHRONULAC) 10 GM/15ML solution as needed.      . metoprolol tartrate (LOPRESSOR) 25 MG tablet TAKE ONE TABLET BY MOUTH TWICE DAILY  180 tablet  2  . omeprazole (PRILOSEC) 20 MG capsule Take 20 mg by mouth daily.        . simvastatin (ZOCOR) 80 MG tablet Take 0.5 tablets (40 mg total) by mouth at bedtime.  30 tablet  12     Past Medical History  Diagnosis Date  . HYPERCHOLESTEROLEMIA   . HYPERLIPIDEMIA   . GLAUCOMA   . OTITIS EXTERNA   . CORONARY ARTERY DISEASE   . COPD   . GERD   . BARRETTS ESOPHAGUS   . Other constipation   . COLONIC POLYPS, HX OF   . Peripheral vascular disease     Past Surgical History  Procedure Date  . Umbilical hernia repair   . Esophagogastroduodenoscopy   . Coronary stent placement     History   Social History  . Marital Status: Married    Spouse Name: N/A    Number of Children: N/A  . Years of Education: N/A   Occupational History  . Not on file.   Social History Main Topics  . Smoking status: Current Every Day Smoker -- 1.5 packs/day for 47 years    Types: Cigarettes  .  Smokeless tobacco: Current User    Types: Chew  . Alcohol Use: 1.2 oz/week    2 Cans of beer per week  . Drug Use: No  . Sexually Active: Not on file   Other Topics Concern  . Not on file   Social History Narrative  . No narrative on file    ROS: no fevers or chills, productive cough, hemoptysis, dysphasia, odynophagia, melena, hematochezia, dysuria, hematuria, rash, seizure activity, orthopnea, PND, pedal edema, claudication. Remaining systems are negative.  Physical Exam: Well-developed well-nourished in no acute distress.  Skin is warm and dry.  HEENT is normal.  Neck is supple.  Chest is clear to auscultation with normal expansion.  Cardiovascular exam is regular rate and rhythm.  Abdominal exam nontender or distended. No masses palpated. Extremities show no edema. neuro grossly intact  ECG NSR, no ST changes

## 2012-04-13 NOTE — Assessment & Plan Note (Signed)
Continue aspirin and statin. Followup carotid Dopplers may 2014.

## 2012-05-02 ENCOUNTER — Ambulatory Visit (HOSPITAL_COMMUNITY): Payer: Self-pay | Attending: Cardiology | Admitting: Radiology

## 2012-05-02 ENCOUNTER — Other Ambulatory Visit (INDEPENDENT_AMBULATORY_CARE_PROVIDER_SITE_OTHER): Payer: Self-pay

## 2012-05-02 VITALS — Ht 71.0 in | Wt 191.0 lb

## 2012-05-02 DIAGNOSIS — E7800 Pure hypercholesterolemia, unspecified: Secondary | ICD-10-CM

## 2012-05-02 DIAGNOSIS — F17200 Nicotine dependence, unspecified, uncomplicated: Secondary | ICD-10-CM | POA: Insufficient documentation

## 2012-05-02 DIAGNOSIS — I2510 Atherosclerotic heart disease of native coronary artery without angina pectoris: Secondary | ICD-10-CM | POA: Insufficient documentation

## 2012-05-02 DIAGNOSIS — R079 Chest pain, unspecified: Secondary | ICD-10-CM

## 2012-05-02 LAB — LIPID PANEL
HDL: 34.6 mg/dL — ABNORMAL LOW (ref >39.00)
Total CHOL/HDL Ratio: 4
Triglycerides: 177 mg/dL — ABNORMAL HIGH (ref 0.0–149.0)

## 2012-05-02 LAB — HEPATIC FUNCTION PANEL
ALT: 16 U/L (ref 0–53)
Albumin: 4.1 g/dL (ref 3.5–5.2)
Bilirubin, Direct: 0.1 mg/dL (ref 0.0–0.3)
Total Protein: 7.1 g/dL (ref 6.0–8.3)

## 2012-05-02 MED ORDER — TECHNETIUM TC 99M SESTAMIBI GENERIC - CARDIOLITE
30.0000 | Freq: Once | INTRAVENOUS | Status: AC | PRN
  Administered 2012-05-02: 30 via INTRAVENOUS

## 2012-05-02 MED ORDER — TECHNETIUM TC 99M SESTAMIBI GENERIC - CARDIOLITE
10.0000 | Freq: Once | INTRAVENOUS | Status: AC | PRN
  Administered 2012-05-02: 10 via INTRAVENOUS

## 2012-05-02 MED ORDER — REGADENOSON 0.4 MG/5ML IV SOLN
0.4000 mg | Freq: Once | INTRAVENOUS | Status: AC
  Administered 2012-05-02: 0.4 mg via INTRAVENOUS

## 2012-05-02 NOTE — Progress Notes (Signed)
[facility] SITE 3 NUCLEAR MED [address] [place], [place] [address] [phone]    Cardiology Nuclear Med Study  [name] [name] is a 62 y.o. male     MRN : [id number]     DOB: 1951/01/23  Procedure Date: 05/02/2012  Nuclear Med Background Indication for Stress Test:  Evaluation for Ischemia, Stent Patency and PTCA Patency History:  '07 Heart Catheterization> Stent LAD, residual moderate RCA, and nonobstructive CAD, EF=55%, and '10 Myocardial Perfusion Study-? Mild inferior ischemia, EF=66% Cardiac Risk Factors: Carotid Disease, Lipids and Smoker  Symptoms:  No symptoms   Nuclear Pre-Procedure Caffeine/Decaff Intake:  None NPO After: 6pm   Lungs:  clear O2 Sat: 97% on room air. IV 0.9% NS with Angio Cath:  22g  IV Site: L Antecubital  IV Started by:  Bonnita Levan, RN  Chest Size (in):  46 Cup Size: n/a  Height: 5\' 11"  (1.803 m)  Weight:  191 lb (86.637 kg)  BMI:  Body mass index is 26.64 kg/(m^2). Tech Comments:  Held Lopressor x 24 hrs    Nuclear Med Study 1 or 2 day study: 1 day  Stress Test Type:  Lexiscan  Reading MD: Kristeen Miss, MD  Order Authorizing Provider:  Olga Millers, MD  Resting Radionuclide: Technetium 64m Sestamibi  Resting Radionuclide Dose: 11.0 mCi   Stress Radionuclide:  Technetium 30m Sestamibi  Stress Radionuclide Dose: 33.0 mCi           Stress Protocol Rest HR: 79 Stress HR: 120  Rest BP: 143/57 Stress BP: 139/76  Exercise Time (min): n/a METS: n/a   Predicted Max HR: 160 bpm % Max HR: 75 bpm Rate Pressure Product: 84696    Dose of Adenosine (mg):  n/a Dose of Lexiscan: 0.4 mg  Dose of Atropine (mg): n/a Dose of Dobutamine: n/a mcg/kg/min (at max HR)  Stress Test Technologist: Irean Hong, RN  Nuclear Technologist:  Domenic Polite, CNMT     Rest Procedure:  Myocardial perfusion imaging was performed at rest 45 minutes following the intravenous administration of Technetium 73m Sestamibi. Rest ECG: NSR - Normal  EKG  Stress Procedure:  The patient received IV Lexiscan 0.4 mg over 15-seconds.  Technetium 7m Sestamibi injected at 30-seconds.The patient complained of stomach pain, but denied chest pain. There were occasional PJC's.  Quantitative spect images were obtained after a 45 minute delay. Stress ECG: No significant change from baseline ECG  QPS Raw Data Images:  There is interference from nuclear activity from structures below the diaphragm. This does not affect the ability to read the study. Stress Images:  There is a moderate sized area of very mild attenuation of the entire inferior wall.  This may be due to the uptake in structures below the diaphragm.     Rest Images:  There is a moderate sized area of very mild attenuation of the entire inferior wall.  This may be due to the uptake in structures below the diaphragm. Subtraction (SDS):  No evidence of ischemia. Transient Ischemic Dilatation (Normal <1.22):  1.09 Lung/Heart Ratio (Normal <0.45):  0.38  Quantitative Gated Spect Images QGS EDV:  97 ml QGS ESV:  34 ml  Impression Exercise Capacity:  Lexiscan with no exercise. BP Response:  Normal blood pressure response. Clinical Symptoms:  No significant symptoms noted. ECG Impression:  No significant ST segment change suggestive of ischemia. Comparison with Prior Nuclear Study: No significant change from previous study on Sep 04, 2008.  Overall Impression:  Low risk stress  nuclear study.  No evidence of ischemia.   LV Ejection Fraction: 65%.  LV Wall Motion:  NL LV Function; NL Wall Motion.    Vesta Mixer, Montez Hageman., MD, [facility] 05/02/2012, 5:57 PM Office - [phone] Pager [phone]

## 2012-06-01 ENCOUNTER — Encounter (INDEPENDENT_AMBULATORY_CARE_PROVIDER_SITE_OTHER): Payer: Self-pay

## 2012-06-01 DIAGNOSIS — R0989 Other specified symptoms and signs involving the circulatory and respiratory systems: Secondary | ICD-10-CM

## 2012-06-15 ENCOUNTER — Telehealth: Payer: Self-pay | Admitting: Cardiology

## 2012-06-15 NOTE — Telephone Encounter (Signed)
Spoke with pt, he is wondering about the carotid dopplers and the outcome of discussion between our office and [facility]. Explained to pt the disc of the carotid ultrasound is in the process of sent to [facility] for review and once they look at the disc we will be able to let him know about his carotids. Pt voiced understanding.

## 2012-06-15 NOTE — Telephone Encounter (Signed)
Pt spouse was wondering have the other provider looked at the study yet and what they are going to do about blockage

## 2012-06-21 ENCOUNTER — Encounter (HOSPITAL_COMMUNITY): Payer: Self-pay | Admitting: Cardiology

## 2012-06-21 ENCOUNTER — Other Ambulatory Visit: Payer: Self-pay | Admitting: *Deleted

## 2012-06-22 ENCOUNTER — Other Ambulatory Visit: Payer: Self-pay | Admitting: *Deleted

## 2012-06-22 ENCOUNTER — Telehealth: Payer: Self-pay | Admitting: Vascular Surgery

## 2012-06-22 NOTE — Telephone Encounter (Addendum)
Message copied by Rosalyn Charters on Fri Jun 22, 2012  1:24 PM ------      Message from: Melene Plan      Created: Fri Jun 22, 2012 10:16 AM      Regarding: FW: office apt                   ----- Message -----         From: Chuck Hint, MD         Sent: 06/22/2012   7:25 AM           To: Melene Plan, RN, Conley Simmonds Pullins, RN      Subject: office apt                                               I need to see this patient in the office with a limited carotid duplex (left side). This is a case in which there has been a difference in our study and Hyndman's study which has caused a lot of discussion. Please have Eber Jones do the study. If there is still a discrepancy, I will get a CT angio.       Thanks      CD ------  notified pt.'s wife of appt. with dr. Edilia Bo on 07-18-12 10:00 am

## 2012-07-03 ENCOUNTER — Encounter: Payer: Self-pay | Admitting: Vascular Surgery

## 2012-07-04 ENCOUNTER — Encounter: Payer: Self-pay | Admitting: Vascular Surgery

## 2012-07-04 ENCOUNTER — Ambulatory Visit (INDEPENDENT_AMBULATORY_CARE_PROVIDER_SITE_OTHER): Payer: Self-pay | Admitting: Vascular Surgery

## 2012-07-04 ENCOUNTER — Other Ambulatory Visit (INDEPENDENT_AMBULATORY_CARE_PROVIDER_SITE_OTHER): Payer: Self-pay

## 2012-07-04 DIAGNOSIS — I6529 Occlusion and stenosis of unspecified carotid artery: Secondary | ICD-10-CM

## 2012-07-04 NOTE — Assessment & Plan Note (Signed)
Duplex at the [facility] office suggested a left carotid stenosis is at approximately 80%. We were unable to obtain the same velocities and by our duplex the stenosis is in the 60-79% range. He remains asymptomatic. Given the discrepancy have recommended we proceed with CT angiogram to help determine if the stenosis on the left is tighter than we will are able to demonstrate on our Duplex scan. Will see him back after this the TA and we'll make further recommendations pending these results. In the meantime he knows to continue taking his aspirin. We have again discussed the importance of tobacco cessation. I have discussed the case with Dr. Eden Emms over the phone today.

## 2012-07-04 NOTE — Progress Notes (Signed)
[facility] [place]  Patient name: [name] [name] MRN: [id number] DOB: 1951/02/26 Sex: male  REASON FOR VISIT: follow up of carotid disease.  HPI: [name] [name] is a 62 y.o. male who I been following with bilateral carotid disease. I last saw him on 03/14/2012. At that time he had a 60-79% left carotid stenosis with a 40-59% right carotid stenosis. The velocities obtained at the [facility] office on the left were higher than the velocities we could obtain in our office. I was reluctant to consider left carotid endarterectomy based on the velocities were obtained in our office. However, given the discrepancy we elected to follow him closely he comes back for a 3 month follow up study.  He is right-handed. He denies any history of stroke, TIAs, expressive or receptive aphasia, or amaurosis fugax.  Since I saw him last, he does state that he had a stress test which was reportedly unremarkable. He does continue his aspirin. He also continues to smoke one pack per day. He's been smoking since he was 62 years old. He denies any recent chest pain.  Past Medical History  Diagnosis Date  . HYPERCHOLESTEROLEMIA   . HYPERLIPIDEMIA   . GLAUCOMA   . OTITIS EXTERNA   . CORONARY ARTERY DISEASE   . COPD   . GERD   . BARRETTS ESOPHAGUS   . Other constipation   . COLONIC POLYPS, HX OF   . Peripheral vascular disease   . Carotid artery occlusion     History reviewed. No pertinent family history.  SOCIAL HISTORY: History  Substance Use Topics  . Smoking status: Current Every Day Smoker -- 1.50 packs/day for 47 years    Types: Cigarettes  . Smokeless tobacco: Current User    Types: Chew  . Alcohol Use: 1.2 oz/week    2 Cans of beer per week    No Known Allergies  Current Outpatient Prescriptions  Medication Sig Dispense Refill  . aspirin 325 MG tablet Take 325 mg by mouth daily.        Marland Kitchen lactulose (CHRONULAC) 10 GM/15ML solution as needed.      . metoprolol tartrate  (LOPRESSOR) 25 MG tablet TAKE ONE TABLET BY MOUTH TWICE DAILY  180 tablet  2  . omeprazole (PRILOSEC) 20 MG capsule Take 20 mg by mouth daily.        . simvastatin (ZOCOR) 80 MG tablet Take 0.5 tablets (40 mg total) by mouth at bedtime.  30 tablet  12   No current facility-administered medications for this visit.    REVIEW OF SYSTEMS: Arly.Keller ] denotes positive finding; [  ] denotes negative finding  CARDIOVASCULAR:  [ ]  chest pain   [ ]  chest pressure   [ ]  palpitations   [ ]  orthopnea   [ ]  dyspnea on exertion   [ ]  claudication   [ ]  rest pain   [ ]  DVT   [ ]  phlebitis PULMONARY:   [ ]  productive cough   [ ]  asthma   [ ]  wheezing NEUROLOGIC:   [ ]  weakness  [ ]  paresthesias  [ ]  aphasia  [ ]  amaurosis  [ ]  dizziness HEMATOLOGIC:   [ ]  bleeding problems   [ ]  clotting disorders MUSCULOSKELETAL:  [ ]  joint pain   [ ]  joint swelling [ ]  leg swelling GASTROINTESTINAL: [ ]   blood in stool  [ ]   hematemesis GENITOURINARY:  [ ]   dysuria  [ ]   hematuria PSYCHIATRIC:  [ ]   history of major depression INTEGUMENTARY:  [ ]  rashes  [ ]  ulcers CONSTITUTIONAL:  [ ]  fever   [ ]  chills  PHYSICAL EXAM: Filed Vitals:   07/04/12 1205 07/04/12 1209  BP: 118/78 102/66  Pulse: 67 66  Resp: 16   Height: 5\' 11"  (1.803 m)   Weight: 191 lb (86.637 kg)   SpO2: 98%    Body mass index is 26.65 kg/(m^2). GENERAL: The patient is a well-nourished male, in no acute distress. The vital signs are documented above. CARDIOVASCULAR: There is a regular rate and rhythm. He has a soft left carotid bruit. PULMONARY: There is good air exchange bilaterally without wheezing or rales. ABDOMEN: Soft and non-tender with normal pitched bowel sounds.  MUSCULOSKELETAL: There are no major deformities or cyanosis. NEUROLOGIC: No focal weakness or paresthesias are detected. SKIN: There are no ulcers or rashes noted. PSYCHIATRIC: The patient has a normal affect.  DATA:  I have reviewed the images that were sent from the [facility]  office. The highest velocities obtained on the left in the proximal internal carotid artery were the peak systolic velocity of 406 cm/s with an end-diastolic velocity of 116 cm/s. In addition I was present during the duplex scan in our office which was done by our lead technologist. The highest velocities that we could obtain on the left in the proximal internal carotid artery were a peak systolic velocity of 266 cm/s with an end-diastolic velocity of 74 cm/s. The ICA to CCA ratio was 3.4. Thus based on our duplex the stenosis appears to be less than 80%, whereas the duplex at the [facility] suggested the stenosis is right at 80%.  MEDICAL ISSUES:  Occlusion and stenosis of carotid artery without mention of cerebral infarction Duplex at the [facility] office suggested a left carotid stenosis is at approximately 80%. We were unable to obtain the same velocities and by our duplex the stenosis is in the 60-79% range. He remains asymptomatic. Given the discrepancy have recommended we proceed with CT angiogram to help determine if the stenosis on the left is tighter than we will are able to demonstrate on our Duplex scan. Will see him back after this the TA and we'll make further recommendations pending these results. In the meantime he knows to continue taking his aspirin. We have again discussed the importance of tobacco cessation. I have discussed the case with Dr. Eden Emms over the phone today.   [name],CHRISTOPHER S [facility] [place] [facility]: [phone]

## 2012-07-04 NOTE — Addendum Note (Signed)
Addended by: Sharee Pimple on: 07/04/2012 03:44 PM   Modules accepted: Orders

## 2012-07-10 ENCOUNTER — Encounter: Payer: Self-pay | Admitting: Vascular Surgery

## 2012-07-11 ENCOUNTER — Encounter: Payer: Self-pay | Admitting: Vascular Surgery

## 2012-07-11 ENCOUNTER — Ambulatory Visit (INDEPENDENT_AMBULATORY_CARE_PROVIDER_SITE_OTHER): Payer: No Typology Code available for payment source | Admitting: Vascular Surgery

## 2012-07-11 ENCOUNTER — Ambulatory Visit
Admission: RE | Admit: 2012-07-11 | Discharge: 2012-07-11 | Disposition: A | Discharge status: Home / Self Care | Payer: No Typology Code available for payment source | Source: Ambulatory Visit | Attending: Vascular Surgery | Admitting: Vascular Surgery

## 2012-07-11 DIAGNOSIS — I6529 Occlusion and stenosis of unspecified carotid artery: Secondary | ICD-10-CM

## 2012-07-11 MED ORDER — IOHEXOL 350 MG/ML SOLN
100.0000 mL | Freq: Once | INTRAVENOUS | Status: AC | PRN
  Administered 2012-07-11: 100 mL via INTRAVENOUS

## 2012-07-11 NOTE — Progress Notes (Signed)
[facility] [place]  Patient name: [name] [name] MRN: [id number] DOB: [birth year] Sex: male  REASON FOR VISIT: follow up after CT angiogram of the neck.  HPI: [name] [name] is a 62 y.o. male I been following with bilateral carotid disease. He is asymptomatic. He has had no history of stroke, TIAs, expressive or receptive aphasia, or amaurosis fugax. A carotid duplex scan at the [facility] office suggested a greater than 80% left carotid stenosis whereas we were unable to obtain the same velocities in our office. For that reason I was reluctant to consider left carotid endarterectomy. In order to further assess this discrepancy we did obtain a CT angiogram which he had done today. He is here to discuss his results. He's had no new symptoms.   REVIEW OF SYSTEMS: Arly.Keller ] denotes positive finding; [  ] denotes negative finding  CARDIOVASCULAR:  [ ]  chest pain   [ ]  dyspnea on exertion    CONSTITUTIONAL:  [ ]  fever   [ ]  chills  PHYSICAL EXAM: Filed Vitals:   07/11/12 1245 07/11/12 1248  BP: 122/61 118/63  Pulse: 81 86  Resp: 16   Height: 5\' 11"  (1.803 m)   Weight: 191 lb (86.637 kg)   SpO2: 99%    Body mass index is 26.65 kg/(m^2). GENERAL: The patient is a well-nourished male. There has been no change in his exam.  I reviewed his CT angiogram. This was interpreted by the radiologist as showing no significant carotid stenosis on the right and a 50% left carotid stenosis.  MEDICAL ISSUES: I believe that the left carotid stenosis is less than 80% and therefore at this point I think we should simply continue to follow this. I've ordered a followup carotid duplex scan in 6 months and I'll see him back at that time. He knows to call sooner if he has problems. In the meantime he is to continue taking his aspirin.  [name],CHRISTOPHER S [facility] [place] [facility]: [phone]

## 2012-07-12 ENCOUNTER — Other Ambulatory Visit: Payer: Self-pay | Admitting: *Deleted

## 2012-07-18 ENCOUNTER — Ambulatory Visit: Payer: Self-pay | Admitting: Vascular Surgery

## 2012-07-18 ENCOUNTER — Other Ambulatory Visit: Payer: Self-pay

## 2012-08-06 ENCOUNTER — Encounter: Payer: Self-pay | Admitting: Internal Medicine

## 2012-08-07 ENCOUNTER — Other Ambulatory Visit: Payer: Self-pay | Admitting: Cardiology

## 2012-10-01 ENCOUNTER — Other Ambulatory Visit: Payer: Self-pay | Admitting: Cardiology

## 2012-10-04 ENCOUNTER — Other Ambulatory Visit: Payer: Self-pay | Admitting: *Deleted

## 2012-10-04 MED ORDER — METOPROLOL TARTRATE 25 MG PO TABS: ORAL_TABLET | ORAL | Status: DC

## 2013-01-15 ENCOUNTER — Encounter: Payer: Self-pay | Admitting: Vascular Surgery

## 2013-01-16 ENCOUNTER — Encounter: Payer: Self-pay | Admitting: Vascular Surgery

## 2013-01-16 ENCOUNTER — Other Ambulatory Visit (INDEPENDENT_AMBULATORY_CARE_PROVIDER_SITE_OTHER): Payer: Self-pay | Admitting: *Deleted

## 2013-01-16 ENCOUNTER — Ambulatory Visit (INDEPENDENT_AMBULATORY_CARE_PROVIDER_SITE_OTHER): Payer: Self-pay | Admitting: Vascular Surgery

## 2013-01-16 DIAGNOSIS — I6529 Occlusion and stenosis of unspecified carotid artery: Secondary | ICD-10-CM

## 2013-01-16 NOTE — Progress Notes (Signed)
[facility] [place]  Patient name: [name] [name] MRN: [id number] DOB: 10/17/1950 Sex: male  REASON FOR VISIT: follow up of left carotid stenosis.   HPI: [name] [name] is a 62 y.o. male who we have been following with a left carotid stenosis. He comes in for a 6 month follow up visit. Since I saw him last, he denies any history of stroke, TIAs, expressive or receptive aphasia, or amaurosis fugax. He does continue to smoke a pack per day. He had quit for about 7 months at one point. He does take aspirin. He is also on a statin.  Past Medical History  Diagnosis Date  . HYPERCHOLESTEROLEMIA   . HYPERLIPIDEMIA   . GLAUCOMA   . OTITIS EXTERNA   . CORONARY ARTERY DISEASE   . COPD   . GERD   . BARRETTS ESOPHAGUS   . Other constipation   . COLONIC POLYPS, HX OF   . Peripheral vascular disease   . Carotid artery occlusion    History reviewed. No pertinent family history.  SOCIAL HISTORY: History  Substance Use Topics  . Smoking status: Current Every Day Smoker -- 1.50 packs/day for 47 years    Types: Cigarettes  . Smokeless tobacco: Current User    Types: Chew  . Alcohol Use: 1.2 oz/week    2 Cans of beer per week   No Known Allergies  Current Outpatient Prescriptions  Medication Sig Dispense Refill  . aspirin 325 MG tablet Take 325 mg by mouth daily.        Marland Kitchen lactulose (CHRONULAC) 10 GM/15ML solution as needed.      . metoprolol tartrate (LOPRESSOR) 25 MG tablet TAKE ONE TABLET BY MOUTH TWICE DAILY  180 tablet  1  . simvastatin (ZOCOR) 80 MG tablet TAKE 1/2 TABLET NIGHTLY AT BEDTIME  30 tablet  11  . omeprazole (PRILOSEC) 20 MG capsule Take 20 mg by mouth daily.         No current facility-administered medications for this visit.   REVIEW OF SYSTEMS: Arly.Keller ] denotes positive finding; [  ] denotes negative finding  CARDIOVASCULAR:  [ ]  chest pain   [ ]  chest pressure   [ ]  palpitations   [ ]  orthopnea   [ ]  dyspnea on exertion   [ ]  claudication   [ ]  rest  pain   [ ]  DVT   [ ]  phlebitis PULMONARY:   [ ]  productive cough   [ ]  asthma   [ ]  wheezing NEUROLOGIC:   [ ]  weakness  [ ]  paresthesias  [ ]  aphasia  [ ]  amaurosis  [ ]  dizziness HEMATOLOGIC:   [ ]  bleeding problems   [ ]  clotting disorders MUSCULOSKELETAL:  [ ]  joint pain   [ ]  joint swelling [ ]  leg swelling GASTROINTESTINAL: [ ]   blood in stool  [ ]   hematemesis GENITOURINARY:  [ ]   dysuria  [ ]   hematuria PSYCHIATRIC:  [ ]  history of major depression INTEGUMENTARY:  [ ]  rashes  [ ]  ulcers CONSTITUTIONAL:  [ ]  fever   [ ]  chills  PHYSICAL EXAM: Filed Vitals:   01/16/13 1304  BP: 116/73  Pulse: 69  Resp: 18  Height: 5\' 11"  (1.803 m)  Weight: 184 lb (83.462 kg)   Body mass index is 25.67 kg/(m^2). GENERAL: The patient is a well-nourished male, in no acute distress. The vital signs are documented above. CARDIOVASCULAR: There is a regular rate and rhythm. He has a left carotid  bruit. He has palpable femoral pulses and palpable dorsalis pedis pulses bilaterally. PULMONARY: There is good air exchange bilaterally without wheezing or rales. ABDOMEN: Soft and non-tender with normal pitched bowel sounds.  MUSCULOSKELETAL: There are no major deformities or cyanosis. NEUROLOGIC: No focal weakness or paresthesias are detected. SKIN: There are no ulcers or rashes noted. PSYCHIATRIC: The patient has a normal affect.  DATA:  I have independently interpreted his carotid duplex scan. He has a 60-79% stenosis of the distal left common carotid artery. Is no significant stenosis on the right. I have compared this to his previous study from 07/04/2012 and there has been no significant increase since that time in the left carotid stenosis. I have reviewed his previous CT angiogram from 07/11/2012 minute does appear that the stenosis was right at the carotid bifurcation to  MEDICAL ISSUES: This patient has an asymptomatic 60-79% left carotid stenosis. I've recommended a fall duplex scan in 6 months  and I'll see him back at that time. We have again had a long discussion about the importance of tobacco cessation. We have also discussed the importance of carefully controlling his blood pressure and cholesterol. He is on a statin. He is on aspirin. Encouraged him to stay as active as possible. See him back in 6 months. He knows to call sooner if he has problems.  Etna Forquer S [facility] [place] [facility]per: [phone]

## 2013-01-23 NOTE — Addendum Note (Signed)
Addended by: Sharee Pimple on: 01/23/2013 01:10 PM   Modules accepted: Orders

## 2013-01-25 ENCOUNTER — Telehealth: Payer: Self-pay | Admitting: Cardiology

## 2013-01-25 ENCOUNTER — Emergency Department (HOSPITAL_COMMUNITY)
Admission: EM | Admit: 2013-01-25 | Discharge: 2013-01-25 | Disposition: A | Discharge status: Home / Self Care | Payer: Self-pay | Attending: Emergency Medicine | Admitting: Emergency Medicine

## 2013-01-25 ENCOUNTER — Encounter (HOSPITAL_COMMUNITY): Payer: Self-pay | Admitting: Family Medicine

## 2013-01-25 ENCOUNTER — Telehealth: Payer: Self-pay | Admitting: Internal Medicine

## 2013-01-25 DIAGNOSIS — IMO0001 Reserved for inherently not codable concepts without codable children: Secondary | ICD-10-CM | POA: Insufficient documentation

## 2013-01-25 DIAGNOSIS — Z8679 Personal history of other diseases of the circulatory system: Secondary | ICD-10-CM | POA: Insufficient documentation

## 2013-01-25 DIAGNOSIS — F17200 Nicotine dependence, unspecified, uncomplicated: Secondary | ICD-10-CM | POA: Insufficient documentation

## 2013-01-25 DIAGNOSIS — W868XXA Exposure to other electric current, initial encounter: Secondary | ICD-10-CM | POA: Insufficient documentation

## 2013-01-25 DIAGNOSIS — J449 Chronic obstructive pulmonary disease, unspecified: Secondary | ICD-10-CM | POA: Insufficient documentation

## 2013-01-25 DIAGNOSIS — Z79899 Other long term (current) drug therapy: Secondary | ICD-10-CM | POA: Insufficient documentation

## 2013-01-25 DIAGNOSIS — E7800 Pure hypercholesterolemia, unspecified: Secondary | ICD-10-CM | POA: Insufficient documentation

## 2013-01-25 DIAGNOSIS — Z860100 Personal history of colon polyps, unspecified: Secondary | ICD-10-CM | POA: Insufficient documentation

## 2013-01-25 DIAGNOSIS — E785 Hyperlipidemia, unspecified: Secondary | ICD-10-CM | POA: Insufficient documentation

## 2013-01-25 DIAGNOSIS — Z8669 Personal history of other diseases of the nervous system and sense organs: Secondary | ICD-10-CM | POA: Insufficient documentation

## 2013-01-25 DIAGNOSIS — Y939 Activity, unspecified: Secondary | ICD-10-CM | POA: Insufficient documentation

## 2013-01-25 DIAGNOSIS — T754XXA Electrocution, initial encounter: Secondary | ICD-10-CM | POA: Insufficient documentation

## 2013-01-25 DIAGNOSIS — K219 Gastro-esophageal reflux disease without esophagitis: Secondary | ICD-10-CM | POA: Insufficient documentation

## 2013-01-25 DIAGNOSIS — I2510 Atherosclerotic heart disease of native coronary artery without angina pectoris: Secondary | ICD-10-CM | POA: Insufficient documentation

## 2013-01-25 DIAGNOSIS — J4489 Other specified chronic obstructive pulmonary disease: Secondary | ICD-10-CM | POA: Insufficient documentation

## 2013-01-25 DIAGNOSIS — Y929 Unspecified place or not applicable: Secondary | ICD-10-CM | POA: Insufficient documentation

## 2013-01-25 DIAGNOSIS — Z8719 Personal history of other diseases of the digestive system: Secondary | ICD-10-CM | POA: Insufficient documentation

## 2013-01-25 DIAGNOSIS — Z7982 Long term (current) use of aspirin: Secondary | ICD-10-CM | POA: Insufficient documentation

## 2013-01-25 LAB — POCT I-STAT, CHEM 8
BUN: 15 mg/dL (ref 6–23)
Creatinine, Ser: 1.1 mg/dL (ref 0.50–1.35)
Glucose, Bld: 109 mg/dL — ABNORMAL HIGH (ref 70–99)
Hemoglobin: 15 g/dL (ref 13.0–17.0)
Potassium: 4.4 mEq/L (ref 3.5–5.1)
Sodium: 141 mEq/L (ref 135–145)

## 2013-01-25 LAB — URINALYSIS, ROUTINE W REFLEX MICROSCOPIC
Glucose, UA: NEGATIVE mg/dL
Hgb urine dipstick: NEGATIVE
Ketones, ur: NEGATIVE mg/dL
Specific Gravity, Urine: 1.012 (ref 1.005–1.030)
Urobilinogen, UA: 0.2 mg/dL (ref 0.0–1.0)
pH: 6.5 (ref 5.0–8.0)

## 2013-01-25 LAB — POCT I-STAT TROPONIN I

## 2013-01-25 MED ORDER — SODIUM CHLORIDE 0.9 % IV BOLUS (SEPSIS)
1000.0000 mL | Freq: Once | INTRAVENOUS | Status: AC
  Administered 2013-01-25: 1000 mL via INTRAVENOUS

## 2013-01-25 NOTE — Telephone Encounter (Signed)
Agree with plan; no further cardiac evaluation Olga Millers
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
## 2013-01-25 NOTE — Telephone Encounter (Signed)
Please try back to see what is going on
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
## 2013-01-25 NOTE — ED Provider Notes (Signed)
CSN: [id number]     Arrival date & time 01/25/13  1316 History   First MD Initiated Contact with Patient 01/25/13 1323     Chief Complaint  Patient presents with  . Electric Shock   (Consider location/radiation/quality/duration/timing/severity/associated sxs/prior Treatment) The history is provided by the patient and medical records.   Pt with PMH significnat for HLP, CAD w/stent, COPD, GERD, PVD, presents to the ED following electrical shock. 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  Shock entered his left hand, traveled across his chest, and exited his right hand.  Pt states there was some blood spewing from his right hand during the shock.  Voltage estimated approx 100- 1000 volts.  Pt denies being thrown or external injuries.  No head trauma or LOC.  Pt only complains of some muscle "soreness" in his left arm and left shoulder.  Denies any numbness or paresthesias.  Denies any current chest pain, SOB, palpitations, dizziness, weakness, or confusion.    Past Medical History  Diagnosis Date  . HYPERCHOLESTEROLEMIA   . HYPERLIPIDEMIA   . GLAUCOMA   . OTITIS EXTERNA   . CORONARY ARTERY DISEASE   . COPD   . GERD   . BARRETTS ESOPHAGUS   . Other constipation   . COLONIC POLYPS, HX OF   . Peripheral vascular disease   . Carotid artery occlusion    Past Surgical History  Procedure Laterality Date  . Umbilical hernia repair    . Esophagogastroduodenoscopy    . Coronary stent placement     History reviewed. No pertinent family history. History  Substance Use Topics  . Smoking status: Current Every Day Smoker -- 1.50 packs/day for 47 years    Types: Cigarettes  . Smokeless tobacco: Current User    Types: Chew  . Alcohol Use: 1.2 oz/week    2 Cans of beer per week    Review of Systems  Constitutional:       Electric shock  Musculoskeletal: Positive for myalgias.  All other systems reviewed and are negative.    Allergies  Review of  patient's allergies indicates no known allergies.  Home Medications   Current Outpatient Rx  Name  Route  Sig  Dispense  Refill  . aspirin 325 MG tablet   Oral   Take 325 mg by mouth daily.           Marland Kitchen lactulose (CHRONULAC) 10 GM/15ML solution      as needed.         . metoprolol tartrate (LOPRESSOR) 25 MG tablet      TAKE ONE TABLET BY MOUTH TWICE DAILY   180 tablet   1   . omeprazole (PRILOSEC) 20 MG capsule   Oral   Take 20 mg by mouth daily.           . simvastatin (ZOCOR) 80 MG tablet      TAKE 1/2 TABLET NIGHTLY AT BEDTIME   30 tablet   11    BP 132/69  Pulse 70  Resp 15  SpO2 97%  Physical Exam  Nursing note and vitals reviewed. Constitutional: He is oriented to person, place, and time. He appears well-developed and well-nourished. No distress.  HENT:  Head: Normocephalic and atraumatic.  Mouth/Throat: Uvula is midline, oropharynx is clear and moist and mucous membranes are normal.  Eyes: Conjunctivae and EOM are normal. Pupils are equal, round, and reactive to light.  Neck: Normal range of motion.  Cardiovascular: Normal  rate, regular rhythm and normal heart sounds.   Pulmonary/Chest: Effort normal and breath sounds normal. No respiratory distress. He has no decreased breath sounds. He has no wheezes.  No streaking across chest  Abdominal: Soft. Bowel sounds are normal. There is no tenderness. There is no guarding.  Musculoskeletal: Normal range of motion.  Entrance wound left palm, exit wound right forearm and right dorsal hand in 4th-5th digit webspace, some dried blood but no active bleeding; surrounding skin normal in appearance  Neurological: He is alert and oriented to person, place, and time. He has normal strength. He displays no tremor. No cranial nerve deficit or sensory deficit. He displays no seizure activity. Gait normal.  No focal neuro deficits appreciated  Skin: Skin is warm and dry. He is not diaphoretic.  Psychiatric: He has a  normal mood and affect. His speech is normal.    ED Course  Procedures (including critical care time)   Date: 01/25/2013  Rate: 87  Rhythm: normal sinus rhythm  QRS Axis: normal  Intervals: normal  ST/T Wave abnormalities: normal  Conduction Disutrbances:none  Narrative Interpretation:   Old EKG Reviewed: unchanged   Labs Review Labs Reviewed  POCT I-STAT, CHEM 8 - Abnormal; Notable for the following:    Glucose, Bld 109 (*)    All other components within normal limits  CK TOTAL AND CKMB  URINALYSIS, ROUTINE W REFLEX MICROSCOPIC  POCT I-STAT TROPONIN I   Imaging Review No results found.  MDM   1. Electric shock, initial encounter    EKG NSR, no acute ischemic changes.  Trop negative.  Labs largely WNL.  Pt continues to be asx and will be d/c home.  FU with PCP as needed.  Given strict ED return precautions for new or worsening symptoms including chest pain, shortness of breath, palpitations, dizziness, or weakness of extremities.  Pt acknowledged understanding and agreed.  Garlon Hatchet, PA-C 01/25/13 1547

## 2013-01-25 NOTE — Telephone Encounter (Signed)
Just check on how he is doing on Monday

## 2013-01-28 NOTE — ED Provider Notes (Signed)
Medical screening examination/treatment/procedure(s) were conducted as a shared visit with non-physician practitioner(s) and myself.  I personally evaluated the patient during the encounter  Please see my separate respective documentation pertaining to this patient encounter   Vida Roller, MD 01/28/13 1520

## 2013-01-28 NOTE — Telephone Encounter (Signed)
Good to hear

## 2013-01-28 NOTE — Telephone Encounter (Signed)
Spoke with patient and he's doing well, he was working on a car and grabbed a wire and got shocked, it came out between his fingers. Pt states he's just sore but doing better and back at work.

## 2013-03-06 ENCOUNTER — Other Ambulatory Visit: Payer: Self-pay | Admitting: Internal Medicine

## 2013-03-06 NOTE — Telephone Encounter (Signed)
Okay to fill 1 bottle Have him set up a physical soon

## 2013-03-06 NOTE — Telephone Encounter (Signed)
rx sent to pharmacy by e-script  

## 2013-03-06 NOTE — Telephone Encounter (Signed)
Pt was last seen 2012 by Dr. Patsy Lager, I can't tell when the last time Dr. Alphonsus Sias seen pt, no future appts, ok to fill?

## 2013-06-21 ENCOUNTER — Ambulatory Visit (INDEPENDENT_AMBULATORY_CARE_PROVIDER_SITE_OTHER): Payer: BC Managed Care – HMO | Admitting: Internal Medicine

## 2013-06-21 ENCOUNTER — Ambulatory Visit (INDEPENDENT_AMBULATORY_CARE_PROVIDER_SITE_OTHER)
Admission: RE | Admit: 2013-06-21 | Discharge: 2013-06-21 | Disposition: A | Discharge status: Home / Self Care | Payer: BC Managed Care – HMO | Source: Ambulatory Visit | Attending: Internal Medicine | Admitting: Internal Medicine

## 2013-06-21 ENCOUNTER — Encounter: Payer: Self-pay | Admitting: Internal Medicine

## 2013-06-21 VITALS — BP 122/80 | HR 83 | Temp 98.3°F | Wt 192.0 lb

## 2013-06-21 DIAGNOSIS — R0781 Pleurodynia: Secondary | ICD-10-CM

## 2013-06-21 DIAGNOSIS — R079 Chest pain, unspecified: Secondary | ICD-10-CM

## 2013-06-21 MED ORDER — TRAMADOL HCL 50 MG PO TABS: 50.0000 mg | ORAL_TABLET | Freq: Three times a day (TID) | ORAL | Status: DC | PRN

## 2013-06-21 NOTE — Assessment & Plan Note (Signed)
No clear fracture on the x-ray---but I can't tell Will await radiology report---mostly for prognosis (time to recovery) Will give analgesic for prn at night

## 2013-06-21 NOTE — Patient Instructions (Signed)
Please try aleve (naproxyn 220mg ) 2 tabs twice a day. If the pain is still bad-- you can try the tramadol

## 2013-06-21 NOTE — Progress Notes (Signed)
Pre visit review using our clinic review tool, if applicable. No additional management support is needed unless otherwise documented below in the visit note. 

## 2013-06-21 NOTE — Progress Notes (Signed)
   Subjective:    Patient ID: [name] [name], male    DOB: [birth year], 63 y.o.   MRN: [id number]  HPI Was leaning over concrete wall---to get wire. 2 days ago Heard a sound (?crack) along right ribs---immediate pain Notes it worse when he picks up wood for stove  Tried a strap--some help No sig cough---has mild chronic cough Breathing is off a little---pain if big breath (or it catches)  Current Outpatient Prescriptions on File Prior to Visit  Medication Sig Dispense Refill  . aspirin 325 MG tablet Take 325 mg by mouth daily.        Marland Kitchen. lactulose (CHRONULAC) 10 GM/15ML solution TAKE 1 TABLESPOONFUL BY MOUTH ONCE DAILY  120 mL  0  . metoprolol tartrate (LOPRESSOR) 25 MG tablet Take 25 mg by mouth 2 (two) times daily.      Marland Kitchen. omeprazole (PRILOSEC) 20 MG capsule Take 20 mg by mouth daily.        . simvastatin (ZOCOR) 80 MG tablet Take 40 mg by mouth at bedtime.       No current facility-administered medications on file prior to visit.    No Known Allergies  Past Medical History  Diagnosis Date  . HYPERCHOLESTEROLEMIA   . HYPERLIPIDEMIA   . GLAUCOMA   . OTITIS EXTERNA   . CORONARY ARTERY DISEASE   . COPD   . GERD   . BARRETTS ESOPHAGUS   . Other constipation   . COLONIC POLYPS, HX OF   . Peripheral vascular disease   . Carotid artery occlusion     Past Surgical History  Procedure Laterality Date  . Umbilical hernia repair    . Esophagogastroduodenoscopy    . Coronary stent placement      No family history on file.  History   Social History  . Marital Status: Married    Spouse Name: N/A    Number of Children: N/A  . Years of Education: N/A   Occupational History  . Not on file.   Social History Main Topics  . Smoking status: Current Every Day Smoker -- 1.50 packs/day for 47 years    Types: Cigarettes  . Smokeless tobacco: Current User    Types: Chew  . Alcohol Use: 1.2 oz/week    2 Cans of beer per week  . Drug Use: No  . Sexual Activity: Not on file    Other Topics Concern  . Not on file   Social History Narrative  . No narrative on file   Review of Systems No fever Able to eat okay    Objective:   Physical Exam  Constitutional: He appears well-developed and well-nourished. No distress.  Cardiovascular: Normal rate, regular rhythm and normal heart sounds.  Exam reveals no gallop.   No murmur heard. Pulmonary/Chest: Effort normal and breath sounds normal. No respiratory distress. He has no wheezes. He has no rales. He exhibits tenderness.  No dullness Tenderness anteriorly around T11-12 (along costal cartilage)          Assessment & Plan:

## 2013-06-24 ENCOUNTER — Telehealth: Payer: Self-pay | Admitting: Internal Medicine

## 2013-06-24 ENCOUNTER — Encounter: Payer: Self-pay | Admitting: *Deleted

## 2013-06-24 NOTE — Telephone Encounter (Signed)
Relevant patient education mailed to patient.  

## 2013-06-26 ENCOUNTER — Telehealth: Payer: Self-pay

## 2013-06-26 NOTE — Telephone Encounter (Signed)
Pt is not at home and Mrs [name] request recent xray report; no DPR signed and advised pt could call back for report but report has already been mailed to pt. Mrs [name] said pt would wait on mailed report.

## 2013-07-08 ENCOUNTER — Ambulatory Visit: Payer: Self-pay | Admitting: Internal Medicine

## 2013-07-16 ENCOUNTER — Encounter: Payer: Self-pay | Admitting: Vascular Surgery

## 2013-07-17 ENCOUNTER — Other Ambulatory Visit (HOSPITAL_COMMUNITY): Payer: Self-pay

## 2013-07-17 ENCOUNTER — Ambulatory Visit: Payer: Self-pay | Admitting: Vascular Surgery

## 2013-08-13 ENCOUNTER — Encounter: Payer: Self-pay | Admitting: Vascular Surgery

## 2013-08-14 ENCOUNTER — Other Ambulatory Visit (HOSPITAL_COMMUNITY): Payer: Self-pay

## 2013-08-14 ENCOUNTER — Ambulatory Visit: Payer: Self-pay | Admitting: Vascular Surgery

## 2013-09-09 ENCOUNTER — Telehealth: Payer: Self-pay | Admitting: Internal Medicine

## 2013-09-09 ENCOUNTER — Ambulatory Visit (INDEPENDENT_AMBULATORY_CARE_PROVIDER_SITE_OTHER): Payer: BC Managed Care – HMO | Admitting: Internal Medicine

## 2013-09-09 ENCOUNTER — Encounter: Payer: Self-pay | Admitting: Internal Medicine

## 2013-09-09 VITALS — BP 120/70 | HR 69 | Temp 98.3°F | Wt 184.0 lb

## 2013-09-09 DIAGNOSIS — E785 Hyperlipidemia, unspecified: Secondary | ICD-10-CM

## 2013-09-09 DIAGNOSIS — L03113 Cellulitis of right upper limb: Secondary | ICD-10-CM

## 2013-09-09 DIAGNOSIS — IMO0002 Reserved for concepts with insufficient information to code with codable children: Secondary | ICD-10-CM

## 2013-09-09 MED ORDER — CEPHALEXIN 500 MG PO CAPS
500.0000 mg | ORAL_CAPSULE | Freq: Four times a day (QID) | ORAL | Status: DC
Start: 2013-09-09 — End: 2014-06-02

## 2013-09-09 MED ORDER — PREDNISONE 20 MG PO TABS: 40.0000 mg | ORAL_TABLET | Freq: Every day | ORAL | Status: DC

## 2013-09-09 MED ORDER — ATORVASTATIN CALCIUM 20 MG PO TABS: 20.0000 mg | ORAL_TABLET | Freq: Every day | ORAL | Status: DC

## 2013-09-09 NOTE — Progress Notes (Signed)
Pre visit review using our clinic review tool, if applicable. No additional management support is needed unless otherwise documented below in the visit note. 

## 2013-09-09 NOTE — Progress Notes (Signed)
   Subjective:    Patient ID: [name] [name], male    DOB: [birth year], 63 y.o.   MRN: [id number]  HPI Sent in by wife  Got bit 2 days ago on right arm just above elbow Also bit on left hip  Arm got very warm and painful yesterday  No fever Tried OTC topical things--may have helped slightly  Has had to stop the cholesterol med Causing pain in arms and joints Cut back from 80-40 and no help Stopped about 3 weeks ago and noticeably better  Current Outpatient Prescriptions on File Prior to Visit  Medication Sig Dispense Refill  . aspirin 325 MG tablet Take 325 mg by mouth daily.        Marland Kitchen. lactulose (CHRONULAC) 10 GM/15ML solution TAKE 1 TABLESPOONFUL BY MOUTH ONCE DAILY  120 mL  0  . metoprolol tartrate (LOPRESSOR) 25 MG tablet Take 25 mg by mouth 2 (two) times daily.      Marland Kitchen. omeprazole (PRILOSEC) 20 MG capsule Take 20 mg by mouth daily.        . simvastatin (ZOCOR) 80 MG tablet Take 40 mg by mouth at bedtime.      . traMADol (ULTRAM) 50 MG tablet Take 1 tablet (50 mg total) by mouth 3 (three) times daily as needed.  30 tablet  0   No current facility-administered medications on file prior to visit.    No Known Allergies  Past Medical History  Diagnosis Date  . HYPERCHOLESTEROLEMIA   . HYPERLIPIDEMIA   . GLAUCOMA   . OTITIS EXTERNA   . CORONARY ARTERY DISEASE   . COPD   . GERD   . BARRETTS ESOPHAGUS   . Other constipation   . COLONIC POLYPS, HX OF   . Peripheral vascular disease   . Carotid artery occlusion     Past Surgical History  Procedure Laterality Date  . Umbilical hernia repair    . Esophagogastroduodenoscopy    . Coronary stent placement      No family history on file.  History   Social History  . Marital Status: Married    Spouse Name: N/A    Number of Children: N/A  . Years of Education: N/A   Occupational History  . Not on file.   Social History Main Topics  . Smoking status: Current Every Day Smoker -- 1.50 packs/day for 47 years    Types:  Cigarettes  . Smokeless tobacco: Current User    Types: Chew  . Alcohol Use: 1.2 oz/week    2 Cans of beer per week  . Drug Use: No  . Sexual Activity: Not on file   Other Topics Concern  . Not on file   Social History Narrative  . No narrative on file   Review of Systems No nausea or vomiting but felt "nasty" this morning    Objective:   Physical Exam  Musculoskeletal:  No joint swelling. Right elbow is completely quiet under the redness  Skin:  Inflamed bug bite on left hip/top of buttock (redness ~2cm around)  Bite over right elbow. Extensive redness with mild warmth over the extensor arm. Not overly tender          Assessment & Plan:

## 2013-09-09 NOTE — Telephone Encounter (Signed)
Relevant patient education assigned to patient using Emmi. ° °

## 2013-09-09 NOTE — Assessment & Plan Note (Signed)
Could be an allergic reaction but need to treat as bacterial infection also Will treat with antibiotic and prednisone

## 2013-09-09 NOTE — Assessment & Plan Note (Signed)
Has myalgia and arthralgia on simvastatin Will take this off  Will try atorvastatin

## 2013-09-09 NOTE — Patient Instructions (Signed)
Please call 1-800 QUIT NOW for help on how to stop smoking. 

## 2013-09-17 ENCOUNTER — Encounter: Payer: Self-pay | Admitting: Vascular Surgery

## 2013-09-18 ENCOUNTER — Ambulatory Visit (HOSPITAL_COMMUNITY)
Admission: RE | Admit: 2013-09-18 | Discharge: 2013-09-18 | Disposition: A | Discharge status: Home / Self Care | Payer: BC Managed Care – HMO | Source: Ambulatory Visit | Attending: Vascular Surgery | Admitting: Vascular Surgery

## 2013-09-18 ENCOUNTER — Encounter: Payer: Self-pay | Admitting: Vascular Surgery

## 2013-09-18 ENCOUNTER — Ambulatory Visit (INDEPENDENT_AMBULATORY_CARE_PROVIDER_SITE_OTHER): Payer: BC Managed Care – HMO | Admitting: Vascular Surgery

## 2013-09-18 VITALS — BP 121/66 | HR 75 | Ht 71.0 in | Wt 184.0 lb

## 2013-09-18 DIAGNOSIS — I6529 Occlusion and stenosis of unspecified carotid artery: Secondary | ICD-10-CM

## 2013-09-18 NOTE — Progress Notes (Signed)
Established Carotid Patient   History of Present Illness  [name] [name] is a 63 y.o. male patient that Dr. Edilia Boickson has been following with a left carotid stenosis  Patient has not had previous carotid artery intervention.  Patient has Negative history of TIA or stroke symptom.  The patient denies amaurosis fugax or monocular blindness.  The patient  denies facial drooping.  Pt. denies hemiplegia.  The patient denies receptive or expressive aphasia.  Pt. denies extremity weakness.  Pt  reports New Medical or Surgical History: his statin is causing him myalgias, his medication has been changed, per pt, but he has not started taking, I encouraged him to start taking ASAP. He denies claudication symptoms with walking, denies history of MI, but has had a cardiac stent placed.  Pt Diabetic: No Pt smoker: smoker  (1 ppd, started at age 63 yrs)  Pt meds include: Statin :No, see above ASA: Yes Other anticoagulants/antiplatelets: no   Past Medical History  Diagnosis Date  . HYPERCHOLESTEROLEMIA   . HYPERLIPIDEMIA   . GLAUCOMA   . OTITIS EXTERNA   . CORONARY ARTERY DISEASE   . COPD   . GERD   . BARRETTS ESOPHAGUS   . Other constipation   . COLONIC POLYPS, HX OF   . Peripheral vascular disease   . Carotid artery occlusion     Social History History  Substance Use Topics  . Smoking status: Current Every Day Smoker -- 1.50 packs/day for 47 years    Types: Cigarettes  . Smokeless tobacco: Current User    Types: Chew  . Alcohol Use: 1.2 oz/week    2 Cans of beer per week    Family History History reviewed. No pertinent family history.  Surgical History Past Surgical History  Procedure Laterality Date  . Umbilical hernia repair    . Esophagogastroduodenoscopy    . Coronary stent placement      Allergies  Allergen Reactions  . Simvastatin     Joint and muscle pain    Current Outpatient Prescriptions  Medication Sig Dispense Refill  . aspirin 325 MG tablet Take 325  mg by mouth daily.        Marland Kitchen. atorvastatin (LIPITOR) 20 MG tablet Take 1 tablet (20 mg total) by mouth daily.  90 tablet  3  . cephALEXin (KEFLEX) 500 MG capsule Take 1 capsule (500 mg total) by mouth 4 (four) times daily.  30 capsule  0  . lactulose (CHRONULAC) 10 GM/15ML solution TAKE 1 TABLESPOONFUL BY MOUTH ONCE DAILY  120 mL  0  . metoprolol tartrate (LOPRESSOR) 25 MG tablet Take 25 mg by mouth 2 (two) times daily.      Marland Kitchen. omeprazole (PRILOSEC) 20 MG capsule Take 20 mg by mouth daily.        . predniSONE (DELTASONE) 20 MG tablet Take 2 tablets (40 mg total) by mouth daily.  10 tablet  0  . traMADol (ULTRAM) 50 MG tablet Take 1 tablet (50 mg total) by mouth 3 (three) times daily as needed.  30 tablet  0   No current facility-administered medications for this visit.    Review of Systems : See HPI for pertinent positives and negatives.  Physical Examination  Filed Vitals:   09/18/13 1555  BP: 121/66  Pulse:     General: WDWN male in NAD GAIT: normal Eyes: PERRLA Pulmonary:  Non-labored, CTAB, Negative  Rales, Negative rhonchi, & Positive transient wheezing. Chronic moist cough, diminished air movement in all fields.  Cardiac: regular  Rhythm ,  Negative detected murmur.  VASCULAR EXAM Carotid Bruits Left Right   Positive Negative   Radial pulses are 3+ palpable and equal.                                                                                                                            LE Pulses LEFT RIGHT       POPLITEAL  not palpable   not palpable       POSTERIOR TIBIAL   palpable    palpable        DORSALIS PEDIS      ANTERIOR TIBIAL  palpable   palpable     Gastrointestinal: soft, nontender, BS WNL, no r/g,  negative masses.  Musculoskeletal: Negative muscle atrophy/wasting. M/S 5/5 throughout, Extremities without ischemic changes.  Neurologic: A&O X 3; Appropriate Affect ; SENSATION ;normal;  Speech is normal CN 2-12 intact, Pain and light touch intact  in extremities, Motor exam as listed above.   Non-Invasive Vascular Imaging CAROTID DUPLEX 09/18/2013   CEREBROVASCULAR DUPLEX EVALUATION    INDICATION: Follow-up carotid disease     PREVIOUS INTERVENTION(S):     DUPLEX EXAM:     RIGHT  LEFT  Peak Systolic Velocities (cm/s) End Diastolic Velocities (cm/s) Plaque LOCATION Peak Systolic Velocities (cm/s) End Diastolic Velocities (cm/s) Plaque  115 25  CCA PROXIMAL 128 31   111 24  CCA MID 95 26   111 26  CCA DISTAL 291 74 HT  158 25  ECA 253 39   107 29 HT ICA PROXIMAL 200 39 HT  91 30  ICA MID 162 31   97 35  ICA DISTAL 66 24     .96 ICA / CCA Ratio (PSV) 2.1  Antegrade  Vertebral Flow Antegrade   118 Brachial Systolic Pressure (mmHg) 120  Within normal limits  Brachial Artery Waveforms Within normal limits     Plaque Morphology:  HM = Homogeneous, HT = Heterogeneous, CP = Calcific Plaque, SP = Smooth Plaque, IP = Irregular Plaque     ADDITIONAL FINDINGS:     IMPRESSION: 1. Evidence of <40% stenosis of the bilateral internal carotid artery. 2. Evidence of >50% stenosis of the left common carotid artery flush with the bifurcation. 3. Bilateral vertebral artery is antegrade.    Compared to the previous exam:  No significant change compared to prior exam.     Assessment: [name] [name] is a 63 y.o. male who presents with asymptomatic carotid artery disease. Evidence of <40% stenosis of the bilateral internal carotid artery. Evidence of >50% stenosis of the left common carotid artery flush with the bifurcation. Bilateral vertebral artery is antegrade. No significant change compared to prior exam.    Plan: Based on today's exam and Duplex results, and after discussing with Dr. Edilia Bo, pt advised to follow-up in 6 months with Carotid Duplex scan.  Pt was counseled re smoking cessation.  I discussed in depth with the patient the nature  of atherosclerosis, and emphasized the importance of maximal medical management  including strict control of blood pressure, blood glucose, and lipid levels, obtaining regular exercise, and cessation of smoking.  The patient is aware that without maximal medical management the underlying atherosclerotic disease process will progress, limiting the benefit of any interventions. The patient was given information about stroke prevention and what symptoms should prompt the patient to seek immediate medical care. Thank you for allowing [place] to participate in this patient's care.  Charisse March, RN, MSN, FNP-C [facility] Office: [phone]  Clinic Physician: Edilia Bo  09/18/2013 4:25 PM

## 2013-09-18 NOTE — Patient Instructions (Signed)
Smoking Cessation Quitting smoking is important to your health and has many advantages. However, it is not always easy to quit since nicotine is a very addictive drug. Often times, people try 3 times or more before being able to quit. This document explains the best ways for you to prepare to quit smoking. Quitting takes hard work and a lot of effort, but you can do it. ADVANTAGES OF QUITTING SMOKING  You will live longer, feel better, and live better.  Your body will feel the impact of quitting smoking almost immediately.  Within 20 minutes, blood pressure decreases. Your pulse returns to its normal level.  After 8 hours, carbon monoxide levels in the blood return to normal. Your oxygen level increases.  After 24 hours, the chance of having a heart attack starts to decrease. Your breath, hair, and body stop smelling like smoke.  After 48 hours, damaged nerve endings begin to recover. Your sense of taste and smell improve.  After 72 hours, the body is virtually free of nicotine. Your bronchial tubes relax and breathing becomes easier.  After 2 to 12 weeks, lungs can hold more air. Exercise becomes easier and circulation improves.  The risk of having a heart attack, stroke, cancer, or lung disease is greatly reduced.  After 1 year, the risk of coronary heart disease is cut in half.  After 5 years, the risk of stroke falls to the same as a nonsmoker.  After 10 years, the risk of lung cancer is cut in half and the risk of other cancers decreases significantly.  After 15 years, the risk of coronary heart disease drops, usually to the level of a nonsmoker.  If you are pregnant, quitting smoking will improve your chances of having a healthy baby.  The people you live with, especially any children, will be healthier.  You will have extra money to spend on things other than cigarettes. QUESTIONS TO THINK ABOUT BEFORE ATTEMPTING TO QUIT You may want to talk about your answers with your  caregiver.  Why do you want to quit?  If you tried to quit in the past, what helped and what did not?  What will be the most difficult situations for you after you quit? How will you plan to handle them?  Who can help you through the tough times? Your family? Friends? A caregiver?  What pleasures do you get from smoking? What ways can you still get pleasure if you quit? Here are some questions to ask your caregiver:  How can you help me to be successful at quitting?  What medicine do you think would be best for me and how should I take it?  What should I do if I need more help?  What is smoking withdrawal like? How can I get information on withdrawal? GET READY  Set a quit date.  Change your environment by getting rid of all cigarettes, ashtrays, matches, and lighters in your home, car, or work. Do not let people smoke in your home.  Review your past attempts to quit. Think about what worked and what did not. GET SUPPORT AND ENCOURAGEMENT You have a better chance of being successful if you have help. You can get support in many ways.  Tell your family, friends, and co-workers that you are going to quit and need their support. Ask them not to smoke around you.  Get individual, group, or telephone counseling and support. Programs are available at local hospitals and health centers. Call your local health department for   information about programs in your area.  Spiritual beliefs and practices may help some smokers quit.  Download a "quit meter" on your computer to keep track of quit statistics, such as how long you have gone without smoking, cigarettes not smoked, and money saved.  Get a self-help book about quitting smoking and staying off of tobacco. LEARN NEW SKILLS AND BEHAVIORS  Distract yourself from urges to smoke. Talk to someone, go for a walk, or occupy your time with a task.  Change your normal routine. Take a different route to work. Drink tea instead of coffee.  Eat breakfast in a different place.  Reduce your stress. Take a hot bath, exercise, or read a book.  Plan something enjoyable to do every day. Reward yourself for not smoking.  Explore interactive web-based programs that specialize in helping you quit. GET MEDICINE AND USE IT CORRECTLY Medicines can help you stop smoking and decrease the urge to smoke. Combining medicine with the above behavioral methods and support can greatly increase your chances of successfully quitting smoking.  Nicotine replacement therapy helps deliver nicotine to your body without the negative effects and risks of smoking. Nicotine replacement therapy includes nicotine gum, lozenges, inhalers, nasal sprays, and skin patches. Some may be available over-the-counter and others require a prescription.  Antidepressant medicine helps people abstain from smoking, but how this works is unknown. This medicine is available by prescription.  Nicotinic receptor partial agonist medicine simulates the effect of nicotine in your brain. This medicine is available by prescription. Ask your caregiver for advice about which medicines to use and how to use them based on your health history. Your caregiver will tell you what side effects to look out for if you choose to be on a medicine or therapy. Carefully read the information on the package. Do not use any other product containing nicotine while using a nicotine replacement product.  RELAPSE OR DIFFICULT SITUATIONS Most relapses occur within the first 3 months after quitting. Do not be discouraged if you start smoking again. Remember, most people try several times before finally quitting. You may have symptoms of withdrawal because your body is used to nicotine. You may crave cigarettes, be irritable, feel very hungry, cough often, get headaches, or have difficulty concentrating. The withdrawal symptoms are only temporary. They are strongest when you first quit, but they will go away within  10 14 days. To reduce the chances of relapse, try to:  Avoid drinking alcohol. Drinking lowers your chances of successfully quitting.  Reduce the amount of caffeine you consume. Once you quit smoking, the amount of caffeine in your body increases and can give you symptoms, such as a rapid heartbeat, sweating, and anxiety.  Avoid smokers because they can make you want to smoke.  Do not let weight gain distract you. Many smokers will gain weight when they quit, usually less than 10 pounds. Eat a healthy diet and stay active. You can always lose the weight gained after you quit.  Find ways to improve your mood other than smoking. FOR MORE INFORMATION  www.smokefree.gov  Document Released: 04/05/2001 Document Revised: 10/11/2011 Document Reviewed: 07/21/2011 [employer] Patient Information 2014 [employer].   Stroke Prevention Some medical conditions and behaviors are associated with an increased chance of having a stroke. You may prevent a stroke by making healthy choices and managing medical conditions. HOW CAN I REDUCE MY RISK OF HAVING A STROKE?   Stay physically active. Get at least 30 minutes of activity on most or all   days.  Do not smoke. It may also be helpful to avoid exposure to secondhand smoke.  Limit alcohol use. Moderate alcohol use is considered to be:  No more than 2 drinks per day for men.  No more than 1 drink per day for nonpregnant women.  Eat healthy foods. This involves  Eating 5 or more servings of fruits and vegetables a day.  Following a diet that addresses high blood pressure (hypertension), high cholesterol, diabetes, or obesity.  Manage your cholesterol levels.  A diet low in saturated fat, trans fat, and cholesterol and high in fiber may control cholesterol levels.  Take any prescribed medicines to control cholesterol as directed by your health care provider.  Manage your diabetes.  A controlled-carbohydrate, controlled-sugar diet is recommended  to manage diabetes.  Take any prescribed medicines to control diabetes as directed by your health care provider.  Control your hypertension.  A low-salt (sodium), low-saturated fat, low-trans fat, and low-cholesterol diet is recommended to manage hypertension.  Take any prescribed medicines to control hypertension as directed by your health care provider.  Maintain a healthy weight.  A reduced-calorie, low-sodium, low-saturated fat, low-trans fat, low-cholesterol diet is recommended to manage weight.  Stop drug abuse.  Avoid taking birth control pills.  Talk to your health care provider about the risks of taking birth control pills if you are over 23 years old, smoke, get migraines, or have ever had a blood clot.  Get evaluated for sleep disorders (sleep apnea).  Talk to your health care provider about getting a sleep evaluation if you snore a lot or have excessive sleepiness.  Take medicines as directed by your health care provider.  For some people, aspirin or blood thinners (anticoagulants) are helpful in reducing the risk of forming abnormal blood clots that can lead to stroke. If you have the irregular heart rhythm of atrial fibrillation, you should be on a blood thinner unless there is a good reason you cannot take them.  Understand all your medicine instructions.  Make sure that other other conditions (such as anemia or atherosclerosis) are addressed. SEEK IMMEDIATE MEDICAL CARE IF:   You have sudden weakness or numbness of the face, arm, or leg, especially on one side of the body.  Your face or eyelid droops to one side.  You have sudden confusion.  You have trouble speaking (aphasia) or understanding.  You have sudden trouble seeing in one or both eyes.  You have sudden trouble walking.  You have dizziness.  You have a loss of balance or coordination.  You have a sudden, severe headache with no known cause.  You have new chest pain or an irregular  heartbeat. Any of these symptoms may represent a serious problem that is an emergency. Do not wait to see if the symptoms will go away. Get medical help at once. Call your local emergency services  (911 in [place] Do not drive yourself to the hospital. Document Released: 05/19/2004 Document Revised: 01/30/2013 Document Reviewed: 10/12/2012 [facility] [employer] Patient Information 2014 [place], [place].

## 2013-10-02 ENCOUNTER — Encounter: Payer: Self-pay | Admitting: Internal Medicine

## 2013-10-03 ENCOUNTER — Other Ambulatory Visit: Payer: Self-pay | Admitting: Cardiology

## 2013-11-05 ENCOUNTER — Ambulatory Visit (INDEPENDENT_AMBULATORY_CARE_PROVIDER_SITE_OTHER): Payer: BC Managed Care – HMO | Admitting: Cardiology

## 2013-11-05 ENCOUNTER — Encounter: Payer: Self-pay | Admitting: Cardiology

## 2013-11-05 VITALS — BP 132/70 | HR 88 | Ht 71.0 in | Wt 190.0 lb

## 2013-11-05 DIAGNOSIS — I2510 Atherosclerotic heart disease of native coronary artery without angina pectoris: Secondary | ICD-10-CM

## 2013-11-05 MED ORDER — METOPROLOL TARTRATE 25 MG PO TABS: 25.0000 mg | ORAL_TABLET | Freq: Two times a day (BID) | ORAL | Status: DC

## 2013-11-05 MED ORDER — ROSUVASTATIN CALCIUM 5 MG PO TABS: 5.0000 mg | ORAL_TABLET | Freq: Every day | ORAL | Status: DC

## 2013-11-05 NOTE — Progress Notes (Signed)
Patient ID: [name] [name], male   DOB: [birth year], 63 y.o.   MRN: [id number]    11/05/2013 [facility]   [birth year]  [id number]  Primary Physicia [name] Abide, MD Primary Cardiologist: Dr. Jens [name]  HPI:  The patient is a 63 year old male, followed by Dr. Jens [name], however he has not been seen since December 2013. He presents to clinic today for followup and also states that he is in need of refills of his medications. Has a history of coronary artery disease. He underwent cardiac catheterization 2007 and had a bare-metal stent to his LAD. He had residual right coronary artery disease. His last Myoview was in May 2010 which showed an ejection fraction of 66%. There was felt to be possible diaphragmatic attenuation but given mild reversibility could not exclude small area of inferior ischemia. We have been treating him medically. He denies any anginal symptoms and had no limitations with physical activity. He also has bilateral carotid artery disease, which is followed by Dr. Edilia [name]. Most recent carotid Doppler ultrasounds, performed in May of this year, revealed less than 80% left carotid stenosis. Dr. Edilia [name] recommended routine 6 month carotid Doppler studies. He also reports that he quit smoking 8 months ago.  As mentioned above, he denies any anginal symptoms. He has no limitations with physical activity. He denies dyspnea on exertion and no resting dyspnea. He has also had no orthopnea, PND, lower extremity edema, palpitations, dizziness, syncope/near-syncope. He states he has been fully compliant with his medications.  Current Outpatient Prescriptions  Medication Sig Dispense Refill  . aspirin 325 MG tablet Take 325 mg by mouth daily.        . cephALEXin (KEFLEX) 500 MG capsule Take 1 capsule (500 mg total) by mouth 4 (four) times daily.  30 capsule  0  . lactulose (CHRONULAC) 10 GM/15ML solution TAKE 1 TABLESPOONFUL BY MOUTH ONCE as needed.      . metoprolol tartrate (LOPRESSOR) 25 MG  tablet Take 25 mg by mouth 2 (two) times daily.      Marland Kitchen omeprazole (PRILOSEC) 20 MG capsule Take 20 mg by mouth daily.        . traMADol (ULTRAM) 50 MG tablet Take 1 tablet (50 mg total) by mouth 3 (three) times daily as needed.  30 tablet  0   No current facility-administered medications for this visit.    Allergies  Allergen Reactions  . Simvastatin     Joint and muscle pain    History   Social History  . Marital Status: Married    Spouse Name: N/A    Number of Children: N/A  . Years of Education: N/A   Occupational History  . Not on file.   Social History Main Topics  . Smoking status: Former Smoker -- 1.50 packs/day for 47 years    Types: Cigarettes    Quit date: 09/24/2013  . Smokeless tobacco: Current User    Types: Chew  . Alcohol Use: 1.2 oz/week    2 Cans of beer per week  . Drug Use: No  . Sexual Activity: Not on file   Other Topics Concern  . Not on file   Social History Narrative  . No narrative on file     Review of Systems: General: negative for chills, fever, night sweats or weight changes.  Cardiovascular: negative for chest pain, dyspnea on exertion, edema, orthopnea, palpitations, paroxysmal nocturnal dyspnea or shortness of breath Dermatological: negative for rash Respiratory: negative for cough or wheezing Urologic: negative for hematuria  Abdominal: negative for nausea, vomiting, diarrhea, bright red blood per rectum, melena, or hematemesis Neurologic: negative for visual changes, syncope, or dizziness All other systems reviewed and are otherwise negative except as noted above.    Blood pressure 132/70, pulse 88, height 5\' 11"  (1.803 m), weight 190 lb (86.183 kg).  General appearance: alert, cooperative and no distress Neck: no carotid bruit and no JVD Lungs: clear to auscultation bilaterally Heart: regular rate and rhythm, S1, S2 normal, no murmur, click, rub or gallop Extremities: no LEE Pulses: 2+ and symmetric Skin: warm and  dry Neurologic: Grossly normal  EKG NSR 88 bpm  ASSESSMENT AND PLAN:   1. CAD: History of bare-metal stent to the LAD in 2007 with low risk nuclear stress test in 2010. He denies any anginal symptoms. Continue medical therapy with aspirin, beta blocker and statin.  2. Hyperlipidemia: He has been on Lipitor, but complains of joint pain and myalgias. He states that [name] [name] had changed him at one point to Crestor however this was discontinued due to the high cost. His private insurance ([employer] [name] [name]). He is eligible for the $3 a month he may plan for Crestor. We discussed this option and he will like to change to Crestor, it this will be better tolerated. He has been on 20 mg of Lipitor. We'll change to 5 mg of Crestor. Recommend to repeat lipid panel when he follows up with [name] [name] in 6 months.  3. Tobacco abuse: Discontinued use 8 months ago.   4. Carotid artery disease: Stable. Followed by [name] [name] every 6 month.  PLAN  Mr. [name] [name] appears stable from a cardiac standpoint. We will continue his current medications as prescribed, with change of statin medication from Lipitor to Crestor. He was provided the discounted payment card. He was provided a refill of his metoprolol. He has been instructed to followup with [name] [name] in 6 months for repeat evaluation and to keep followup appointments with [name] [name] as instructed.   [name] [name], [name] [name] 11/05/2013 2:30 PM

## 2013-11-05 NOTE — Patient Instructions (Signed)
[name] Simmons, PA-C, has recommended making the following medication changes:  STOP Atorvastatin  START Crestor 5 mg - take 1 tablet daily  Your physician recommends that you schedule a follow-up appointment in 6 months with Dr Leonard SchwartzB Jens Somrenshaw.

## 2013-12-09 ENCOUNTER — Other Ambulatory Visit: Payer: Self-pay | Admitting: *Deleted

## 2013-12-09 MED ORDER — ROSUVASTATIN CALCIUM 5 MG PO TABS: 5.0000 mg | ORAL_TABLET | Freq: Every day | ORAL | Status: DC

## 2013-12-19 ENCOUNTER — Ambulatory Visit: Payer: BC Managed Care – HMO | Admitting: Internal Medicine

## 2014-01-10 ENCOUNTER — Encounter: Payer: Self-pay | Admitting: Internal Medicine

## 2014-03-04 ENCOUNTER — Telehealth: Payer: Self-pay | Admitting: Cardiology

## 2014-03-04 MED ORDER — ROSUVASTATIN CALCIUM 5 MG PO TABS: 5.0000 mg | ORAL_TABLET | Freq: Every day | ORAL | Status: DC

## 2014-03-04 NOTE — Telephone Encounter (Signed)
Rx was sent to pharmacy electronically. 

## 2014-03-04 NOTE — Telephone Encounter (Signed)
Pt's wife called in stating that he is out of his Crestor and that he has received a letter stating that he now qualifies to get his meds in a 90 day supply. He would like his new prescription called in to the [facility] [place]  Thanks

## 2014-03-25 ENCOUNTER — Encounter: Payer: Self-pay | Admitting: Family

## 2014-03-26 ENCOUNTER — Encounter: Payer: Self-pay | Admitting: Family

## 2014-03-26 ENCOUNTER — Ambulatory Visit (HOSPITAL_COMMUNITY)
Admission: RE | Admit: 2014-03-26 | Discharge: 2014-03-26 | Disposition: A | Discharge status: Home / Self Care | Payer: BC Managed Care – HMO | Source: Ambulatory Visit | Attending: Family | Admitting: Family

## 2014-03-26 ENCOUNTER — Ambulatory Visit (INDEPENDENT_AMBULATORY_CARE_PROVIDER_SITE_OTHER): Payer: BC Managed Care – HMO | Admitting: Family

## 2014-03-26 VITALS — BP 118/71 | HR 73 | Resp 16 | Ht 71.0 in | Wt 200.0 lb

## 2014-03-26 DIAGNOSIS — I6523 Occlusion and stenosis of bilateral carotid arteries: Secondary | ICD-10-CM | POA: Diagnosis not present

## 2014-03-26 DIAGNOSIS — Z48812 Encounter for surgical aftercare following surgery on the circulatory system: Secondary | ICD-10-CM

## 2014-03-26 NOTE — Addendum Note (Signed)
Addended by: Adria DillELDRIDGE-LEWIS, Tita Terhaar L on: 03/26/2014 04:27 PM   Modules accepted: Orders

## 2014-03-26 NOTE — Patient Instructions (Addendum)
Stroke Prevention Some medical conditions and behaviors are associated with an increased chance of having a stroke. You may prevent a stroke by making healthy choices and managing medical conditions. HOW CAN I REDUCE MY RISK OF HAVING A STROKE?   Stay physically active. Get at least 30 minutes of activity on most or all days.  Do not smoke. It may also be helpful to avoid exposure to secondhand smoke.  Limit alcohol use. Moderate alcohol use is considered to be:  No more than 2 drinks per day for men.  No more than 1 drink per day for nonpregnant women.  Eat healthy foods. This involves:  Eating 5 or more servings of fruits and vegetables a day.  Making dietary changes that address high blood pressure (hypertension), high cholesterol, diabetes, or obesity.  Manage your cholesterol levels.  Making food choices that are high in fiber and low in saturated fat, trans fat, and cholesterol may control cholesterol levels.  Take any prescribed medicines to control cholesterol as directed by your health care provider.  Manage your diabetes.  Controlling your carbohydrate and sugar intake is recommended to manage diabetes.  Take any prescribed medicines to control diabetes as directed by your health care provider.  Control your hypertension.  Making food choices that are low in salt (sodium), saturated fat, trans fat, and cholesterol is recommended to manage hypertension.  Take any prescribed medicines to control hypertension as directed by your health care provider.  Maintain a healthy weight.  Reducing calorie intake and making food choices that are low in sodium, saturated fat, trans fat, and cholesterol are recommended to manage weight.  Stop drug abuse.  Avoid taking birth control pills.  Talk to your health care provider about the risks of taking birth control pills if you are over 35 years old, smoke, get migraines, or have ever had a blood clot.  Get evaluated for sleep  disorders (sleep apnea).  Talk to your health care provider about getting a sleep evaluation if you snore a lot or have excessive sleepiness.  Take medicines only as directed by your health care provider.  For some people, aspirin or blood thinners (anticoagulants) are helpful in reducing the risk of forming abnormal blood clots that can lead to stroke. If you have the irregular heart rhythm of atrial fibrillation, you should be on a blood thinner unless there is a good reason you cannot take them.  Understand all your medicine instructions.  Make sure that other conditions (such as anemia or atherosclerosis) are addressed. SEEK IMMEDIATE MEDICAL CARE IF:   You have sudden weakness or numbness of the face, arm, or leg, especially on one side of the body.  Your face or eyelid droops to one side.  You have sudden confusion.  You have trouble speaking (aphasia) or understanding.  You have sudden trouble seeing in one or both eyes.  You have sudden trouble walking.  You have dizziness.  You have a loss of balance or coordination.  You have a sudden, severe headache with no known cause.  You have new chest pain or an irregular heartbeat. Any of these symptoms may represent a serious problem that is an emergency. Do not wait to see if the symptoms will go away. Get medical help at once. Call your local emergency services (911 in [place] Do not drive yourself to the hospital. Document Released: 05/19/2004 Document Revised: 08/26/2013 Document Reviewed: 10/12/2012 Exit[employer] Patient Information 2015 [employer]. This information is not intended to replace advice given   to you by your health care provider. Make sure you discuss any questions you have with your health care provider.    Smoking Cessation Quitting smoking is important to your health and has many advantages. However, it is not always easy to quit since nicotine is a very addictive drug. Oftentimes, people try 3 times or  more before being able to quit. This document explains the best ways for you to prepare to quit smoking. Quitting takes hard work and a lot of effort, but you can do it. ADVANTAGES OF QUITTING SMOKING  You will live longer, feel better, and live better.  Your body will feel the impact of quitting smoking almost immediately.  Within 20 minutes, blood pressure decreases. Your pulse returns to its normal level.  After 8 hours, carbon monoxide levels in the blood return to normal. Your oxygen level increases.  After 24 hours, the chance of having a heart attack starts to decrease. Your breath, hair, and body stop smelling like smoke.  After 48 hours, damaged nerve endings begin to recover. Your sense of taste and smell improve.  After 72 hours, the body is virtually free of nicotine. Your bronchial tubes relax and breathing becomes easier.  After 2 to 12 weeks, lungs can hold more air. Exercise becomes easier and circulation improves.  The risk of having a heart attack, stroke, cancer, or lung disease is greatly reduced.  After 1 year, the risk of coronary heart disease is cut in half.  After 5 years, the risk of stroke falls to the same as a nonsmoker.  After 10 years, the risk of lung cancer is cut in half and the risk of other cancers decreases significantly.  After 15 years, the risk of coronary heart disease drops, usually to the level of a nonsmoker.  If you are pregnant, quitting smoking will improve your chances of having a healthy baby.  The people you live with, especially any children, will be healthier.  You will have extra money to spend on things other than cigarettes. QUESTIONS TO THINK ABOUT BEFORE ATTEMPTING TO QUIT You may want to talk about your answers with your health care provider.  Why do you want to quit?  If you tried to quit in the past, what helped and what did not?  What will be the most difficult situations for you after you quit? How will you plan to  handle them?  Who can help you through the tough times? Your family? Friends? A health care provider?  What pleasures do you get from smoking? What ways can you still get pleasure if you quit? Here are some questions to ask your health care provider:  How can you help me to be successful at quitting?  What medicine do you think would be best for me and how should I take it?  What should I do if I need more help?  What is smoking withdrawal like? How can I get information on withdrawal? GET READY  Set a quit date.  Change your environment by getting rid of all cigarettes, ashtrays, matches, and lighters in your home, car, or work. Do not let people smoke in your home.  Review your past attempts to quit. Think about what worked and what did not. GET SUPPORT AND ENCOURAGEMENT You have a better chance of being successful if you have help. You can get support in many ways.  Tell your family, friends, and coworkers that you are going to quit and need their support. Ask them   not to smoke around you.  Get individual, group, or telephone counseling and support. Programs are available at local hospitals and health centers. Call your local health department for information about programs in your area.  Spiritual beliefs and practices may help some smokers quit.  Download a "quit meter" on your computer to keep track of quit statistics, such as how long you have gone without smoking, cigarettes not smoked, and money saved.  Get a self-help book about quitting smoking and staying off tobacco. LEARN NEW SKILLS AND BEHAVIORS  Distract yourself from urges to smoke. Talk to someone, go for a walk, or occupy your time with a task.  Change your normal routine. Take a different route to work. Drink tea instead of coffee. Eat breakfast in a different place.  Reduce your stress. Take a hot bath, exercise, or read a book.  Plan something enjoyable to do every day. Reward yourself for not  smoking.  Explore interactive web-based programs that specialize in helping you quit. GET MEDICINE AND USE IT CORRECTLY Medicines can help you stop smoking and decrease the urge to smoke. Combining medicine with the above behavioral methods and support can greatly increase your chances of successfully quitting smoking.  Nicotine replacement therapy helps deliver nicotine to your body without the negative effects and risks of smoking. Nicotine replacement therapy includes nicotine gum, lozenges, inhalers, nasal sprays, and skin patches. Some may be available over-the-counter and others require a prescription.  Antidepressant medicine helps people abstain from smoking, but how this works is unknown. This medicine is available by prescription.  Nicotinic receptor partial agonist medicine simulates the effect of nicotine in your brain. This medicine is available by prescription. Ask your health care provider for advice about which medicines to use and how to use them based on your health history. Your health care provider will tell you what side effects to look out for if you choose to be on a medicine or therapy. Carefully read the information on the package. Do not use any other product containing nicotine while using a nicotine replacement product.  RELAPSE OR DIFFICULT SITUATIONS Most relapses occur within the first 3 months after quitting. Do not be discouraged if you start smoking again. Remember, most people try several times before finally quitting. You may have symptoms of withdrawal because your body is used to nicotine. You may crave cigarettes, be irritable, feel very hungry, cough often, get headaches, or have difficulty concentrating. The withdrawal symptoms are only temporary. They are strongest when you first quit, but they will go away within 10-14 days. To reduce the chances of relapse, try to:  Avoid drinking alcohol. Drinking lowers your chances of successfully quitting.  Reduce the  amount of caffeine you consume. Once you quit smoking, the amount of caffeine in your body increases and can give you symptoms, such as a rapid heartbeat, sweating, and anxiety.  Avoid smokers because they can make you want to smoke.  Do not let weight gain distract you. Many smokers will gain weight when they quit, usually less than 10 pounds. Eat a healthy diet and stay active. You can always lose the weight gained after you quit.  Find ways to improve your mood other than smoking. FOR MORE INFORMATION  www.smokefree.gov  Document Released: 04/05/2001 Document Revised: 08/26/2013 Document Reviewed: 07/21/2011 Exit[employer] Patient Information 2015 [employer]. This information is not intended to replace advice given to you by your health care provider. Make sure you discuss any questions you have with your health   care provider.    Smoking Cessation, Tips for Success If you are ready to quit smoking, congratulations! You have chosen to help yourself be healthier. Cigarettes bring nicotine, tar, carbon monoxide, and other irritants into your body. Your lungs, heart, and blood vessels will be able to work better without these poisons. There are many different ways to quit smoking. Nicotine gum, nicotine patches, a nicotine inhaler, or nicotine nasal spray can help with physical craving. Hypnosis, support groups, and medicines help break the habit of smoking. WHAT THINGS CAN I DO TO MAKE QUITTING EASIER?  Here are some tips to help you quit for good:  Pick a date when you will quit smoking completely. Tell all of your friends and family about your plan to quit on that date.  Do not try to slowly cut down on the number of cigarettes you are smoking. Pick a quit date and quit smoking completely starting on that day.  Throw away all cigarettes.   Clean and remove all ashtrays from your home, work, and car.  On a card, write down your reasons for quitting. Carry the card with you and read it  when you get the urge to smoke.  Cleanse your body of nicotine. Drink enough water and fluids to keep your urine clear or pale yellow. Do this after quitting to flush the nicotine from your body.  Learn to predict your moods. Do not let a bad situation be your excuse to have a cigarette. Some situations in your life might tempt you into wanting a cigarette.  Never have "just one" cigarette. It leads to wanting another and another. Remind yourself of your decision to quit.  Change habits associated with smoking. If you smoked while driving or when feeling stressed, try other activities to replace smoking. Stand up when drinking your coffee. Brush your teeth after eating. Sit in a different chair when you read the paper. Avoid alcohol while trying to quit, and try to drink fewer caffeinated beverages. Alcohol and caffeine may urge you to smoke.  Avoid foods and drinks that can trigger a desire to smoke, such as sugary or spicy foods and alcohol.  Ask people who smoke not to smoke around you.  Have something planned to do right after eating or having a cup of coffee. For example, plan to take a walk or exercise.  Try a relaxation exercise to calm you down and decrease your stress. Remember, you may be tense and nervous for the first 2 weeks after you quit, but this will pass.  Find new activities to keep your hands busy. Play with a pen, coin, or rubber band. Doodle or draw things on paper.  Brush your teeth right after eating. This will help cut down on the craving for the taste of tobacco after meals. You can also try mouthwash.   Use oral substitutes in place of cigarettes. Try using lemon drops, carrots, cinnamon sticks, or chewing gum. Keep them handy so they are available when you have the urge to smoke.  When you have the urge to smoke, try deep breathing.  Designate your home as a nonsmoking area.  If you are a heavy smoker, ask your health care provider about a prescription for  nicotine chewing gum. It can ease your withdrawal from nicotine.  Reward yourself. Set aside the cigarette money you save and buy yourself something nice.  Look for support from others. Join a support group or smoking cessation program. Ask someone at home or at work   to help you with your plan to quit smoking.  Always ask yourself, "Do I need this cigarette or is this just a reflex?" Tell yourself, "Today, I choose not to smoke," or "I do not want to smoke." You are reminding yourself of your decision to quit.  Do not replace cigarette smoking with electronic cigarettes (commonly called e-cigarettes). The safety of e-cigarettes is unknown, and some may contain harmful chemicals.  If you relapse, do not give up! Plan ahead and think about what you will do the next time you get the urge to smoke. HOW WILL I FEEL WHEN I QUIT SMOKING? You may have symptoms of withdrawal because your body is used to nicotine (the addictive substance in cigarettes). You may crave cigarettes, be irritable, feel very hungry, cough often, get headaches, or have difficulty concentrating. The withdrawal symptoms are only temporary. They are strongest when you first quit but will go away within 10-14 days. When withdrawal symptoms occur, stay in control. Think about your reasons for quitting. Remind yourself that these are signs that your body is healing and getting used to being without cigarettes. Remember that withdrawal symptoms are easier to treat than the major diseases that smoking can cause.  Even after the withdrawal is over, expect periodic urges to smoke. However, these cravings are generally short lived and will go away whether you smoke or not. Do not smoke! WHAT RESOURCES ARE AVAILABLE TO HELP ME QUIT SMOKING? Your health care provider can direct you to community resources or hospitals for support, which may include:  Group support.  Education.  Hypnosis.  Therapy. Document Released: 01/08/2004 Document  Revised: 08/26/2013 Document Reviewed: 09/27/2012 ExitCare Patient Information 2015 [employer]. This information is not intended to replace advice given to you by your health care provider. Make sure you discuss any questions you have with your health care provider.  

## 2014-03-26 NOTE — Progress Notes (Signed)
Established Carotid Patient   History of Present Illness  [name] [name] is a 63 y.o. male patient that Dr. Edilia Boickson has been following with a left carotid stenosis  Patient has not had previous carotid artery intervention.  Patient has Negative history of TIA or stroke symptom. The patient denies amaurosis fugax or monocular blindness. The patient denies facial drooping. Pt. denies hemiplegia. The patient denies receptive or expressive aphasia. Pt. denies extremity weakness.  Pt reports New Medical or Surgical History: is taking a statin.  He denies claudication symptoms with walking, denies history of MI, but has had a cardiac stent placed.  Pt Diabetic: No Pt smoker: former smoker (quit about May 2015)  Pt meds include: Statin :yes ASA: Yes Other anticoagulants/antiplatelets: no  Past Medical History  Diagnosis Date  . HYPERCHOLESTEROLEMIA   . HYPERLIPIDEMIA   . GLAUCOMA   . OTITIS EXTERNA   . CORONARY ARTERY DISEASE   . COPD   . GERD   . BARRETTS ESOPHAGUS   . Other constipation   . COLONIC POLYPS, HX OF   . Peripheral vascular disease   . Carotid artery occlusion     Social History History  Substance Use Topics  . Smoking status: Former Smoker -- 1.50 packs/day for 47 years    Types: Cigarettes    Quit date: 09/24/2013  . Smokeless tobacco: Current User    Types: Chew  . Alcohol Use: 1.2 oz/week    2 Cans of beer per week    Family History No family history on file.  Surgical History Past Surgical History  Procedure Laterality Date  . Umbilical hernia repair    . Esophagogastroduodenoscopy    . Coronary stent placement      Allergies  Allergen Reactions  . Simvastatin     Joint and muscle pain    Current Outpatient Prescriptions  Medication Sig Dispense Refill  . aspirin 325 MG tablet Take 325 mg by mouth daily.      Marland Kitchen. lactulose (CHRONULAC) 10 GM/15ML solution TAKE 1 TABLESPOONFUL BY MOUTH ONCE as needed.    . metoprolol tartrate  (LOPRESSOR) 25 MG tablet Take 1 tablet (25 mg total) by mouth 2 (two) times daily. 90 tablet 3  . omeprazole (PRILOSEC) 20 MG capsule Take 20 mg by mouth daily.      . rosuvastatin (CRESTOR) 5 MG tablet Take 1 tablet (5 mg total) by mouth daily. 90 tablet 1  . cephALEXin (KEFLEX) 500 MG capsule Take 1 capsule (500 mg total) by mouth 4 (four) times daily. (Patient not taking: Reported on 03/26/2014) 30 capsule 0  . traMADol (ULTRAM) 50 MG tablet Take 1 tablet (50 mg total) by mouth 3 (three) times daily as needed. (Patient not taking: Reported on 03/26/2014) 30 tablet 0   No current facility-administered medications for this visit.    Review of Systems : See HPI for pertinent positives and negatives.  Physical Examination  Filed Vitals:   03/26/14 1436 03/26/14 1441  BP: 118/72 118/71  Pulse: 75 73  Resp:  16  Height:  5\' 11"  (1.803 m)  Weight:  200 lb (90.719 kg)  SpO2:  98%   Body mass index is 27.91 kg/(m^2).  General: WDWN male in NAD GAIT: normal Eyes: PERRLA Pulmonary: Non-labored, CTAB, Negative Rales, Negative rhonchi, & no wheezing.  Cardiac: regular Rhythm, no detected murmur.  VASCULAR EXAM Carotid Bruits Left Right   Positive Negative  Aorta is not palpable Radial pulses are 3+ palpable and equal.  LE Pulses LEFT RIGHT   POPLITEAL not palpable  not palpable   POSTERIOR TIBIAL  palpable   palpable    DORSALIS PEDIS  ANTERIOR TIBIAL palpable  palpable     Gastrointestinal: soft, nontender, BS WNL, no r/g, no palpated masses.  Musculoskeletal: Negative muscle atrophy/wasting. M/S 5/5 throughout, Extremities without ischemic changes.  Neurologic: A&O X 3; Appropriate Affect ; SENSATION ;normal;  Speech is normal CN 2-12 intact, Pain and light touch intact in extremities, Motor  exam as listed above.    Non-Invasive Vascular Imaging CAROTID DUPLEX 03/26/2014   CEREBROVASCULAR DUPLEX EVALUATION    INDICATION: Follow-up carotid disease     PREVIOUS INTERVENTION(S):     DUPLEX EXAM:     RIGHT  LEFT  Peak Systolic Velocities (cm/s) End Diastolic Velocities (cm/s) Plaque LOCATION Peak Systolic Velocities (cm/s) End Diastolic Velocities (cm/s) Plaque  123 35  CCA PROXIMAL 128 38   104 28  CCA MID 121 38   96 27  CCA DISTAL 241 67 HT  116 22  ECA 228 54   76 19 HT ICA PROXIMAL 183 37 HT  99 34  ICA MID 113 32   92 35  ICA DISTAL 80 30     .79 ICA / CCA Ratio (PSV) NA  Antegrade  Vertebral Flow Antegrade   120 Brachial Systolic Pressure (mmHg) 122  Within normal limits  Brachial Artery Waveforms Within normal limits     Plaque Morphology:  HM = Homogeneous, HT = Heterogeneous, CP = Calcific Plaque, SP = Smooth Plaque, IP = Irregular Plaque  ADDITIONAL FINDINGS:     IMPRESSION: 1. <40% stenosis of the bilateral internal carotid artery. 2. >50% stenosis of the distal left common carotid artery, flush with the bifurcation. 3. Bilateral vertebral artery is antegrade.    Compared to the previous exam:  No significant change compared to prior exam.      Assessment: [name] [name] is a 63 y.o. male who presents with asymptomatic <40% stenosis of the bilateral internal carotid artery and >50% stenosis of the distal left common carotid artery, flush with the bifurcation. No significant change compared to prior exam.  Fortunately he stopped smoking 5-6 months ago and was congratulated.  He no longer has wheezing on auscultation of his lungs, no longer has a chronic cough.   Plan:  Follow-up in 6 months with Carotid Duplex.   I discussed in depth with the patient the nature of atherosclerosis, and emphasized the importance of maximal medical management including strict control of blood pressure, blood glucose, and lipid levels, obtaining regular exercise, and  continued cessation of smoking.  The patient is aware that without maximal medical management the underlying atherosclerotic disease process will progress, limiting the benefit of any interventions. The patient was given information about stroke prevention and what symptoms should prompt the patient to seek immediate medical care. Thank you for allowing us to participate in this patient's care.  Charisse MarchSuzanne Nickel, RN, MSN, FNP-C [facility] Office: [phone]  Clinic Physician: Edilia BoDickson  03/26/2014 2:35 PM

## 2014-03-31 ENCOUNTER — Encounter: Payer: Self-pay | Admitting: Cardiology

## 2014-03-31 ENCOUNTER — Ambulatory Visit (INDEPENDENT_AMBULATORY_CARE_PROVIDER_SITE_OTHER): Payer: BC Managed Care – HMO | Admitting: Cardiology

## 2014-03-31 VITALS — BP 110/74 | HR 77 | Ht 71.0 in | Wt 200.1 lb

## 2014-03-31 DIAGNOSIS — R079 Chest pain, unspecified: Secondary | ICD-10-CM

## 2014-03-31 MED ORDER — NITROGLYCERIN 0.4 MG SL SUBL: 0.4000 mg | SUBLINGUAL_TABLET | SUBLINGUAL | Status: AC | PRN

## 2014-03-31 MED ORDER — PANTOPRAZOLE SODIUM 40 MG PO TBEC: 40.0000 mg | DELAYED_RELEASE_TABLET | Freq: Every day | ORAL | Status: DC

## 2014-03-31 NOTE — Patient Instructions (Addendum)
Your physician recommends that you schedule a follow-up appointment in: 2 Months with Dr Jens Somrenshaw or call us sooner if symptoms do not improve  Your physician has recommended you make the following change in your medication: Stop Omeprazole and Start Protonix 40 mg daily

## 2014-03-31 NOTE — Progress Notes (Signed)
03/31/2014 [name] [name]   06-27-1950  [id number]  Primary Physician Tillman Abideichard Letvak, MD Primary Cardiologist: Dr. Jens Somrenshaw  HPI:   The patient is a 63 year old male followed by Dr. Jens Somrenshaw.  He has a history of coronary artery disease. He underwent cardiac catheterization 2007 and had a bare-metal stent to his LAD. He had residual right coronary artery disease. His last Myoview was in May 2010 which showed an ejection fraction of 66%. There was felt to be possible diaphragmatic attenuation but given mild reversibility could not exclude small area of inferior ischemia. We have been treating him medically.  He also has bilateral carotid artery disease which is followed by Dr. Edilia Boickson. Most recent carotid Doppler ultrasound, performed in May of this year, revealed less than 80% left carotid stenosis. Dr. Edilia Boickson recommended routine 6 month carotid Doppler studies.  The patient also discontinued tobacco use roughly one year ago. His last office visit was in July of this year.  I evaluated him. At that time, he denied any anginal symptoms and noted no limitations with physical activity. He was felt to be stable from a cardiac standpoint.  He presents back to clinic today because he has experienced intermittent episodes of left-sided chest discomfort which he believes is acid reflux.  However, he comes to clinic today for evaluation for reassurance that this is not a cardiac issue.  There has been no particular pattern to his pain. He denies any exertional chest discomfort. He is able to engage in exertional activities without symptoms and without any limitations. This chest discomfort is described as a dull ache/burning in the left side of his chest that does not radiate. No other associated symptoms. He denies dyspnea, diaphoresis, lightheadedness, dizziness, nausea or vomiting with his episodes. Symptoms are often relieved after belching. He has not had to use any nitroglycerin. He has a prior history of  GERD and has been on Prilosec. In clinic today, he is currently chest pain-free. His EKG demonstrates normal sinus rhythm without any ischemic abnormalities. Compared to prior EKG , there are no changes. His blood pressure is well-controlled at 110/74.  He is on daily aspirin, Metoprolol and Crestor.   Current Outpatient Prescriptions  Medication Sig Dispense Refill  . aspirin 325 MG tablet Take 325 mg by mouth daily.      . cephALEXin (KEFLEX) 500 MG capsule Take 1 capsule (500 mg total) by mouth 4 (four) times daily. (Patient not taking: Reported on 03/26/2014) 30 capsule 0  . lactulose (CHRONULAC) 10 GM/15ML solution TAKE 1 TABLESPOONFUL BY MOUTH ONCE as needed.    . metoprolol tartrate (LOPRESSOR) 25 MG tablet Take 1 tablet (25 mg total) by mouth 2 (two) times daily. 90 tablet 3  . omeprazole (PRILOSEC) 20 MG capsule Take 20 mg by mouth daily.      . rosuvastatin (CRESTOR) 5 MG tablet Take 1 tablet (5 mg total) by mouth daily. 90 tablet 1   No current facility-administered medications for this visit.    Allergies  Allergen Reactions  . Simvastatin     Joint and muscle pain    History   Social History  . Marital Status: Married    Spouse Name: N/A    Number of Children: N/A  . Years of Education: N/A   Occupational History  . Not on file.   Social History Main Topics  . Smoking status: Former Smoker -- 1.50 packs/day for 47 years    Types: Cigarettes    Quit date: 09/24/2013  .  Smokeless tobacco: Current User    Types: Chew  . Alcohol Use: 1.2 oz/week    2 Cans of beer per week  . Drug Use: No  . Sexual Activity: Not on file   Other Topics Concern  . Not on file   Social History Narrative     Review of Systems: General: negative for chills, fever, night sweats or weight changes.  Cardiovascular: negative for chest pain, dyspnea on exertion, edema, orthopnea, palpitations, paroxysmal nocturnal dyspnea or shortness of breath Dermatological: negative for  rash Respiratory: negative for cough or wheezing Urologic: negative for hematuria Abdominal: negative for nausea, vomiting, diarrhea, bright red blood per rectum, melena, or hematemesis Neurologic: negative for visual changes, syncope, or dizziness All other systems reviewed and are otherwise negative except as noted above.    Blood pressure 110/74, pulse 77, height 5\' 11"  (1.803 m), weight 200 lb 1.6 oz (90.765 kg).  General appearance: alert, cooperative and mild distress Neck:+  bilateral carotid bruit and no JVD Lungs: clear to auscultation bilaterally Heart: regular rate and rhythm, S1, S2 normal, no murmur, click, rub or gallop Extremities: no LEE  Pulses: 2+ and symmetric Skin: warm and dry Neurologic: Grossly normal  EKG   Normal sinus rhythm. No  ischemic abnormalities.  ASSESSMENT AND PLAN:    1. Atypical chest pain:  Patient has known CAD status post PCI plus bare-metal stenting to his LAD in 2007. He had residual right coronary artery disease. His last Myoview was in May 2010 which showed an ejection fraction of 66%. There was felt to be possible diaphragmatic attenuation but given mild reversibility could not exclude small area of inferior ischemia. We have been treating him medically.  His recent chest pain has been atypical.  It is not brought on nor exacerbated by physical activity and he has had no other associated symptoms concerning for unstable angina.  Symptoms are often relieved with belching.  He has a known history of GERD and has been on Prilosec. I feel that his recent symptoms are more consistent with GI etiology.  We will discontinue his Prilosec and place him on Protonix, 40 mg daily. If he has recurrent symptoms with change in PPI therapy, then would recommend pursuing an ischemic evaluation to rule out cardiac etiologies.    PLAN   Start Protonix 40 mg daily.  Return in 2 weeks if continuation of symptoms.  If symptoms resolve, continue routine follow-up with  Dr. Jens Somrenshaw.  Lavoris Sparling, BRITTAINYPA-C 03/31/2014 3:05 PM

## 2014-05-31 NOTE — Progress Notes (Signed)
      HPI: FU coronary disease. He underwent cardiac catheterization in 2007 and had a bare-metal stent to his LAD. He has residual right coronary artery disease. Last Myoview in Jan 2014 showed an ejection fraction of 65%. No ischemia. Also with cerebrovascular disease followed by vascular surgery. Seen recently for CP felt to be GI in etiology. Since last seen, the patient denies any dyspnea on exertion, orthopnea, PND, pedal edema, palpitations, syncope or chest pain.   Current Outpatient Prescriptions  Medication Sig Dispense Refill  . aspirin 325 MG tablet Take 325 mg by mouth daily.      Marland Kitchen. lactulose (CHRONULAC) 10 GM/15ML solution TAKE 1 TABLESPOONFUL BY MOUTH ONCE as needed.    . metoprolol tartrate (LOPRESSOR) 25 MG tablet Take 1 tablet (25 mg total) by mouth 2 (two) times daily. 90 tablet 3  . nitroGLYCERIN (NITROSTAT) 0.4 MG SL tablet Place 1 tablet (0.4 mg total) under the tongue every 5 (five) minutes as needed for chest pain. 25 tablet 2  . pantoprazole (PROTONIX) 40 MG tablet Take 1 tablet (40 mg total) by mouth daily. 90 tablet 2  . rosuvastatin (CRESTOR) 5 MG tablet Take 1 tablet (5 mg total) by mouth daily. 90 tablet 1   No current facility-administered medications for this visit.     Past Medical History  Diagnosis Date  . HYPERCHOLESTEROLEMIA   . HYPERLIPIDEMIA   . GLAUCOMA   . OTITIS EXTERNA   . CORONARY ARTERY DISEASE   . COPD   . GERD   . BARRETTS ESOPHAGUS   . Other constipation   . COLONIC POLYPS, HX OF   . Peripheral vascular disease   . Carotid artery occlusion     Past Surgical History  Procedure Laterality Date  . Umbilical hernia repair    . Esophagogastroduodenoscopy    . Coronary stent placement      History   Social History  . Marital Status: Married    Spouse Name: N/A    Number of Children: N/A  . Years of Education: N/A   Occupational History  . Not on file.   Social History Main Topics  . Smoking status: Former Smoker -- 1.50  packs/day for 47 years    Types: Cigarettes    Quit date: 09/24/2013  . Smokeless tobacco: Current User    Types: Chew  . Alcohol Use: 1.2 oz/week    2 Cans of beer per week  . Drug Use: No  . Sexual Activity: Not on file   Other Topics Concern  . Not on file   Social History Narrative    ROS: no fevers or chills, productive cough, hemoptysis, dysphasia, odynophagia, melena, hematochezia, dysuria, hematuria, rash, seizure activity, orthopnea, PND, pedal edema, claudication. Remaining systems are negative.  Physical Exam: Well-developed well-nourished in no acute distress.  Skin is warm and dry.  HEENT is normal.  Neck is supple.  Chest is clear to auscultation with normal expansion.  Cardiovascular exam is regular rate and rhythm.  Abdominal exam nontender or distended. No masses palpated. Extremities show no edema. neuro grossly intact  ECG 03/31/2014-sinus rhythm, PVC, RV conduction delay.

## 2014-06-02 ENCOUNTER — Encounter: Payer: Self-pay | Admitting: Cardiology

## 2014-06-02 ENCOUNTER — Ambulatory Visit (INDEPENDENT_AMBULATORY_CARE_PROVIDER_SITE_OTHER): Payer: BLUE CROSS/BLUE SHIELD | Admitting: Cardiology

## 2014-06-02 VITALS — BP 112/78 | HR 72 | Ht 71.0 in | Wt 203.1 lb

## 2014-06-02 DIAGNOSIS — I2510 Atherosclerotic heart disease of native coronary artery without angina pectoris: Secondary | ICD-10-CM

## 2014-06-02 DIAGNOSIS — I10 Essential (primary) hypertension: Secondary | ICD-10-CM

## 2014-06-02 DIAGNOSIS — I679 Cerebrovascular disease, unspecified: Secondary | ICD-10-CM

## 2014-06-02 DIAGNOSIS — F17200 Nicotine dependence, unspecified, uncomplicated: Secondary | ICD-10-CM

## 2014-06-02 DIAGNOSIS — Z720 Tobacco use: Secondary | ICD-10-CM

## 2014-06-02 NOTE — Patient Instructions (Signed)
Your physician recommends that you schedule a follow-up appointment in: One year.  

## 2014-06-02 NOTE — Assessment & Plan Note (Signed)
Patient counseled on discontinuing. 

## 2014-06-02 NOTE — Assessment & Plan Note (Signed)
Continue aspirin and statin. Followed by vascular surgery. 

## 2014-06-02 NOTE — Assessment & Plan Note (Signed)
Continue statin. 

## 2014-06-02 NOTE — Assessment & Plan Note (Signed)
Continue present blood pressure medications. 

## 2014-06-02 NOTE — Assessment & Plan Note (Signed)
Continue aspirin and statin. Previous symptoms felt to be GI mediated. They have resolved.

## 2014-09-29 ENCOUNTER — Encounter: Payer: Self-pay | Admitting: Family

## 2014-09-30 ENCOUNTER — Encounter: Payer: Self-pay | Admitting: Family

## 2014-10-01 ENCOUNTER — Ambulatory Visit (INDEPENDENT_AMBULATORY_CARE_PROVIDER_SITE_OTHER): Payer: BLUE CROSS/BLUE SHIELD | Admitting: Family

## 2014-10-01 ENCOUNTER — Encounter: Payer: Self-pay | Admitting: Family

## 2014-10-01 ENCOUNTER — Ambulatory Visit (HOSPITAL_COMMUNITY)
Admission: RE | Admit: 2014-10-01 | Discharge: 2014-10-01 | Disposition: A | Discharge status: Home / Self Care | Payer: BLUE CROSS/BLUE SHIELD | Source: Ambulatory Visit | Attending: Family | Admitting: Family

## 2014-10-01 VITALS — BP 104/68 | HR 66 | Resp 16 | Ht 71.0 in | Wt 197.0 lb

## 2014-10-01 DIAGNOSIS — Z48812 Encounter for surgical aftercare following surgery on the circulatory system: Secondary | ICD-10-CM | POA: Diagnosis not present

## 2014-10-01 DIAGNOSIS — Z87891 Personal history of nicotine dependence: Secondary | ICD-10-CM | POA: Diagnosis not present

## 2014-10-01 DIAGNOSIS — I6523 Occlusion and stenosis of bilateral carotid arteries: Secondary | ICD-10-CM | POA: Insufficient documentation

## 2014-10-01 NOTE — Progress Notes (Signed)
Established Carotid Patient   History of Present Illness  [name] [name] is a 64 y.o. male patient that Dr. Edilia Boickson has been following for carotid artery stenosis  Patient has not had previous carotid artery intervention.  Patient has Negative history of TIA or stroke symptom. The patient denies amaurosis fugax or monocular blindness. The patient denies facial drooping. Pt. denies hemiplegia. The patient denies receptive or expressive aphasia. Pt. denies extremity weakness.  Pt reports New Medical or Surgical History: none  He denies claudication symptoms with walking, denies history of MI, but had a cardiac stent placed.  Pt Diabetic: No Pt smoker: former smoker (quit about May 2015)  Pt meds include: Statin :yes ASA: Yes Other anticoagulants/antiplatelets: no  Past Medical History  Diagnosis Date  . HYPERCHOLESTEROLEMIA   . HYPERLIPIDEMIA   . GLAUCOMA   . OTITIS EXTERNA   . CORONARY ARTERY DISEASE   . COPD   . GERD   . BARRETTS ESOPHAGUS   . Other constipation   . COLONIC POLYPS, HX OF   . Peripheral vascular disease   . Carotid artery occlusion     Social History History  Substance Use Topics  . Smoking status: Former Smoker -- 1.50 packs/day for 47 years    Types: Cigarettes    Quit date: 09/24/2013  . Smokeless tobacco: Current User    Types: Chew  . Alcohol Use: 1.2 oz/week    2 Cans of beer per week    Family History Family History  Problem Relation Age of Onset  . Heart disease Father [id number]    Before age 63- Open Heart surgery  . Hyperlipidemia Mother     Surgical History Past Surgical History  Procedure Laterality Date  . Umbilical hernia repair    . Esophagogastroduodenoscopy    . Coronary stent placement      Allergies  Allergen Reactions  . Simvastatin     Joint and muscle pain    Current Outpatient Prescriptions  Medication Sig Dispense Refill  . aspirin 325 MG tablet Take 325 mg by mouth daily.      Marland Kitchen. lactulose (CHRONULAC)  10 GM/15ML solution TAKE 1 TABLESPOONFUL BY MOUTH ONCE as needed.    . metoprolol tartrate (LOPRESSOR) 25 MG tablet Take 1 tablet (25 mg total) by mouth 2 (two) times daily. 90 tablet 3  . nitroGLYCERIN (NITROSTAT) 0.4 MG SL tablet Place 1 tablet (0.4 mg total) under the tongue every 5 (five) minutes as needed for chest pain. 25 tablet 2  . pantoprazole (PROTONIX) 40 MG tablet Take 1 tablet (40 mg total) by mouth daily. 90 tablet 2  . rosuvastatin (CRESTOR) 5 MG tablet Take 1 tablet (5 mg total) by mouth daily. 90 tablet 1   No current facility-administered medications for this visit.    Review of Systems : See HPI for pertinent positives and negatives.  Physical Examination  Filed Vitals:   10/01/14 1509 10/01/14 1512  BP: 117/70 104/68  Pulse: 65 66  Resp:  16  Height:  5\' 11"  (1.803 m)  Weight:  197 lb (89.359 kg)  SpO2:  98%   Body mass index is 27.49 kg/(m^2).  General: WDWN male in NAD GAIT: normal Eyes: PERRLA Pulmonary: Non-labored, CTAB, Negative Rales, Negative rhonchi, & no wheezing.  Cardiac: regular Rhythm, no detected murmur.  VASCULAR EXAM Carotid Bruits Left Right   Positive Negative  Aorta is not palpable Radial pulses are 2+ palpable and equal.      LE Pulses LEFT RIGHT  POPLITEAL not palpable  not palpable   POSTERIOR TIBIAL  palpable   palpable    DORSALIS PEDIS  ANTERIOR TIBIAL palpable  palpable     Gastrointestinal: soft, nontender, BS WNL, no r/g, no palpated masses.  Musculoskeletal: Negative muscle atrophy/wasting. M/S 5/5 throughout, Extremities without ischemic changes.  Neurologic: A&O X 3; Appropriate Affect,  Speech is normal CN 2-12 intact, Pain and light touch intact in extremities, Motor exam as listed above.         Non-Invasive  Vascular Imaging CAROTID DUPLEX 10/01/2014   CEREBROVASCULAR DUPLEX EVALUATION    INDICATION: Carotid artery disease     PREVIOUS INTERVENTION(S):     DUPLEX EXAM:     RIGHT  LEFT  Peak Systolic Velocities (cm/s) End Diastolic Velocities (cm/s) Plaque LOCATION Peak Systolic Velocities (cm/s) End Diastolic Velocities (cm/s) Plaque  105 25  CCA PROXIMAL 108 25   79 20  CCA MID 89 29   91 26  CCA DISTAL 220 60 HT  119 23  ECA 234 35   70 18 HT ICA PROXIMAL 90 20 HT  98 39  ICA MID 108 36   113 41  ICA DISTAL 70 21     1.43 ICA / CCA Ratio (PSV) 1.21  Antegrade  Vertebral Flow Antegrade   120 Brachial Systolic Pressure (mmHg) 120  Multiphasic (Subclavian artery) Brachial Artery Waveforms Multiphasic (Subclavian artery)    Plaque Morphology:  HM = Homogeneous, HT = Heterogeneous, CP = Calcific Plaque, SP = Smooth Plaque, IP = Irregular Plaque  ADDITIONAL FINDINGS:     IMPRESSION: Bilateral internal carotid artery velocities suggest a 1-49% stenosis.  >50% stenosis of the left distal common carotid artery, flush with the bifurcation.    Compared to the previous exam:  No significant change in comparison to the last exam on 03/26/2014.      Assessment: [name] [name] is a 64 y.o. male who has no history of stroke or TIA. Today's carotid Duplex suggests minimal bilateral internal carotid artery stenosis and >50% stenosis of the left distal common carotid artery, flush with the bifurcation. No significant change in comparison to the last exam on 03/26/2014.   Plan: Follow-up in 1 year with Carotid Duplex.   I discussed in depth with the patient the nature of atherosclerosis, and emphasized the importance of maximal medical management including strict control of blood pressure, blood glucose, and lipid levels, obtaining regular exercise, and continued cessation of smoking.  The patient is aware that without maximal medical management the underlying atherosclerotic disease process  will progress, limiting the benefit of any interventions. The patient was given information about stroke prevention and what symptoms should prompt the patient to seek immediate medical care. Thank you for allowing [place] to participate in this patient's care.  Charisse March, RN, MSN, FNP-C [facility] Office: [phone]  Clinic Physician: Darrick Penna  10/01/2014 3:33 PM

## 2014-10-01 NOTE — Patient Instructions (Signed)
Stroke Prevention Some medical conditions and behaviors are associated with an increased chance of having a stroke. You may prevent a stroke by making healthy choices and managing medical conditions. HOW CAN I REDUCE MY RISK OF HAVING A STROKE?   Stay physically active. Get at least 30 minutes of activity on most or all days.  Do not smoke. It may also be helpful to avoid exposure to secondhand smoke.  Limit alcohol use. Moderate alcohol use is considered to be:  No more than 2 drinks per day for men.  No more than 1 drink per day for nonpregnant women.  Eat healthy foods. This involves:  Eating 5 or more servings of fruits and vegetables a day.  Making dietary changes that address high blood pressure (hypertension), high cholesterol, diabetes, or obesity.  Manage your cholesterol levels.  Making food choices that are high in fiber and low in saturated fat, trans fat, and cholesterol may control cholesterol levels.  Take any prescribed medicines to control cholesterol as directed by your health care provider.  Manage your diabetes.  Controlling your carbohydrate and sugar intake is recommended to manage diabetes.  Take any prescribed medicines to control diabetes as directed by your health care provider.  Control your hypertension.  Making food choices that are low in salt (sodium), saturated fat, trans fat, and cholesterol is recommended to manage hypertension.  Take any prescribed medicines to control hypertension as directed by your health care provider.  Maintain a healthy weight.  Reducing calorie intake and making food choices that are low in sodium, saturated fat, trans fat, and cholesterol are recommended to manage weight.  Stop drug abuse.  Avoid taking birth control pills.  Talk to your health care provider about the risks of taking birth control pills if you are over 35 years old, smoke, get migraines, or have ever had a blood clot.  Get evaluated for sleep  disorders (sleep apnea).  Talk to your health care provider about getting a sleep evaluation if you snore a lot or have excessive sleepiness.  Take medicines only as directed by your health care provider.  For some people, aspirin or blood thinners (anticoagulants) are helpful in reducing the risk of forming abnormal blood clots that can lead to stroke. If you have the irregular heart rhythm of atrial fibrillation, you should be on a blood thinner unless there is a good reason you cannot take them.  Understand all your medicine instructions.  Make sure that other conditions (such as anemia or atherosclerosis) are addressed. SEEK IMMEDIATE MEDICAL CARE IF:   You have sudden weakness or numbness of the face, arm, or leg, especially on one side of the body.  Your face or eyelid droops to one side.  You have sudden confusion.  You have trouble speaking (aphasia) or understanding.  You have sudden trouble seeing in one or both eyes.  You have sudden trouble walking.  You have dizziness.  You have a loss of balance or coordination.  You have a sudden, severe headache with no known cause.  You have new chest pain or an irregular heartbeat. Any of these symptoms may represent a serious problem that is an emergency. Do not wait to see if the symptoms will go away. Get medical help at once. Call your local emergency services (911 in [place] Do not drive yourself to the hospital. Document Released: 05/19/2004 Document Revised: 08/26/2013 Document Reviewed: 10/12/2012 ExitCare Patient Information 2015 [employer]. This information is not intended to replace advice given   to you by your health care provider. Make sure you discuss any questions you have with your health care provider.  

## 2014-10-02 NOTE — Addendum Note (Signed)
Addended by: Adria Dill L on: 10/02/2014 04:28 PM   Modules accepted: Orders

## 2014-11-02 ENCOUNTER — Other Ambulatory Visit: Payer: Self-pay | Admitting: Cardiology

## 2014-11-03 ENCOUNTER — Other Ambulatory Visit: Payer: Self-pay

## 2014-11-03 MED ORDER — METOPROLOL TARTRATE 25 MG PO TABS: 25.0000 mg | ORAL_TABLET | Freq: Two times a day (BID) | ORAL | Status: DC

## 2014-11-04 ENCOUNTER — Other Ambulatory Visit: Payer: Self-pay

## 2014-11-04 MED ORDER — ROSUVASTATIN CALCIUM 5 MG PO TABS: 5.0000 mg | ORAL_TABLET | Freq: Every day | ORAL | Status: DC

## 2014-12-06 ENCOUNTER — Encounter: Payer: Self-pay | Admitting: *Deleted

## 2014-12-06 ENCOUNTER — Emergency Department
Admission: EM | Admit: 2014-12-06 | Discharge: 2014-12-06 | Disposition: A | Discharge status: Home / Self Care | Payer: BLUE CROSS/BLUE SHIELD | Attending: Emergency Medicine | Admitting: Emergency Medicine

## 2014-12-06 DIAGNOSIS — H578 Other specified disorders of eye and adnexa: Secondary | ICD-10-CM | POA: Diagnosis not present

## 2014-12-06 DIAGNOSIS — H579 Unspecified disorder of eye and adnexa: Secondary | ICD-10-CM

## 2014-12-06 DIAGNOSIS — Z79899 Other long term (current) drug therapy: Secondary | ICD-10-CM | POA: Insufficient documentation

## 2014-12-06 DIAGNOSIS — Z791 Long term (current) use of non-steroidal anti-inflammatories (NSAID): Secondary | ICD-10-CM | POA: Insufficient documentation

## 2014-12-06 DIAGNOSIS — Z720 Tobacco use: Secondary | ICD-10-CM | POA: Diagnosis not present

## 2014-12-06 DIAGNOSIS — Z7982 Long term (current) use of aspirin: Secondary | ICD-10-CM | POA: Insufficient documentation

## 2014-12-06 DIAGNOSIS — M542 Cervicalgia: Secondary | ICD-10-CM | POA: Diagnosis present

## 2014-12-06 DIAGNOSIS — M436 Torticollis: Secondary | ICD-10-CM | POA: Diagnosis not present

## 2014-12-06 DIAGNOSIS — I10 Essential (primary) hypertension: Secondary | ICD-10-CM | POA: Diagnosis not present

## 2014-12-06 MED ORDER — KETOROLAC TROMETHAMINE 0.5 % OP SOLN: 1.0000 [drp] | Freq: Four times a day (QID) | OPHTHALMIC | Status: DC

## 2014-12-06 MED ORDER — DIAZEPAM 5 MG PO TABS: 5.0000 mg | ORAL_TABLET | Freq: Four times a day (QID) | ORAL | Status: DC | PRN

## 2014-12-06 MED ORDER — NAPROXEN 500 MG PO TABS: 500.0000 mg | ORAL_TABLET | Freq: Two times a day (BID) | ORAL | Status: DC

## 2014-12-06 MED ORDER — KETOROLAC TROMETHAMINE 60 MG/2ML IM SOLN
60.0000 mg | Freq: Once | INTRAMUSCULAR | Status: AC
  Administered 2014-12-06: 60 mg via INTRAMUSCULAR
  Filled 2014-12-06: qty 2

## 2014-12-06 MED ORDER — TOBRAMYCIN 0.3 % OP SOLN
2.0000 [drp] | OPHTHALMIC | Status: DC
  Administered 2014-12-06: 2 [drp] via OPHTHALMIC
  Filled 2014-12-06: qty 5

## 2014-12-06 MED ORDER — DIAZEPAM 5 MG PO TABS
5.0000 mg | ORAL_TABLET | Freq: Once | ORAL | Status: AC
  Administered 2014-12-06: 5 mg via ORAL
  Filled 2014-12-06: qty 1

## 2014-12-06 MED ORDER — FLUORESCEIN SODIUM 1 MG OP STRP
1.0000 | ORAL_STRIP | Freq: Once | OPHTHALMIC | Status: AC
  Administered 2014-12-06: 1 via OPHTHALMIC
  Filled 2014-12-06: qty 1

## 2014-12-06 MED ORDER — CIPROFLOXACIN HCL 0.3 % OP SOLN: 2.0000 [drp] | OPHTHALMIC | Status: DC

## 2014-12-06 NOTE — Discharge Instructions (Signed)
Blurred Vision °You have been seen today complaining of blurred vision. This means you have a loss of ability to see small details.  °CAUSES  °Blurred vision can be a symptom of underlying eye problems, such as: °· Aging of the eye (presbyopia). °· Glaucoma. °· Cataracts. °· Eye infection. °· Eye-related migraine. °· Diabetes mellitus. °· Fatigue. °· Migraine headaches. °· High blood pressure. °· Breakdown of the back of the eye (macular degeneration). °· Problems caused by some medications. °The most common cause of blurred vision is the need for eyeglasses or a new prescription. Today in the emergency department, no cause for your blurred vision can be found. °SYMPTOMS  °Blurred vision is the loss of visual sharpness and detail (acuity). °DIAGNOSIS  °Should blurred vision continue, you should see your caregiver. If your caregiver is your primary care physician, he or she may choose to refer you to another specialist.  °TREATMENT  °Do not ignore your blurred vision. Make sure to have it checked out to see if further treatment or referral is necessary. °SEEK MEDICAL CARE IF:  °You are unable to get into a specialist so we can help you with a referral. °SEEK IMMEDIATE MEDICAL CARE IF: °You have severe eye pain, severe headache, or sudden loss of vision. °MAKE SURE YOU:  °· Understand these instructions. °· Will watch your condition. °· Will get help right away if you are not doing well or get worse. °Document Released: 04/14/2003 Document Revised: 07/04/2011 Document Reviewed: 11/14/2007 °Exit[employer]® Patient Information ©2015 [employer]. This information is not intended to replace advice given to you by your health care provider. Make sure you discuss any questions you have with your health care provider. ° °

## 2014-12-06 NOTE — ED Notes (Signed)
Patient c/o neck pain after lifting "something" X3 or 4 days ago. Patient also reports having a blockage in the left side of his neck and it is checked every 6 months

## 2014-12-06 NOTE — ED Provider Notes (Signed)
[facility] Emergency Department Provider Note ____________________________________________  Time seen: Approximately 3:42 PM  I have reviewed the triage vital signs and the nursing notes.   HISTORY  Chief Complaint Neck Pain   HPI [name] [name] is a 64 y.o. male presents to the emergency department for a fat neck pain for 3-4 days that has continuously worsened. He has had no specific injury. He also has something in his right eye. He states he felt something hit his eye while lying in bed last night. 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 He states it feels "irritated."    Past Medical History  Diagnosis Date  . HYPERCHOLESTEROLEMIA   . HYPERLIPIDEMIA   . GLAUCOMA   . OTITIS EXTERNA   . CORONARY ARTERY DISEASE   . COPD   . GERD   . BARRETTS ESOPHAGUS   . Other constipation   . COLONIC POLYPS, HX OF   . Peripheral vascular disease   . Carotid artery occlusion     Patient Active Problem List   Diagnosis Date Noted  . Cellulitis of arm, right 09/09/2013  . Occlusion and stenosis of carotid artery without mention of cerebral infarction 03/14/2012  . Cerebrovascular disease 01/27/2011  . Hypertension 01/27/2011  . CAROTID BRUIT 01/26/2010  . TOBACCO ABUSE 08/27/2008  . HYPERLIPIDEMIA 04/15/2007  . HYPERCHOLESTEROLEMIA 04/11/2007  . GLAUCOMA 04/11/2007  . Coronary atherosclerosis 04/11/2007  . COPD 04/11/2007  . GERD 04/11/2007  . BARRETTS ESOPHAGUS 04/11/2007  . COLONIC POLYPS, HX OF 04/11/2007    Past Surgical History  Procedure Laterality Date  . Umbilical hernia repair    . Esophagogastroduodenoscopy    . Coronary stent placement      Current Outpatient Rx  Name  Route  Sig  Dispense  Refill  . aspirin 325 MG tablet   Oral   Take 325 mg by mouth daily.           . diazepam (VALIUM) 5 MG tablet   Oral   Take 1 tablet (5 mg total) by mouth every 6 (six) hours as needed for anxiety.   30 tablet    0   . ketorolac (ACULAR) 0.5 % ophthalmic solution   Right Eye   Place 1 drop into the right eye 4 (four) times daily.   5 mL   0   . lactulose (CHRONULAC) 10 GM/15ML solution      TAKE 1 TABLESPOONFUL BY MOUTH ONCE as needed.         . metoprolol tartrate (LOPRESSOR) 25 MG tablet   Oral   Take 1 tablet (25 mg total) by mouth 2 (two) times daily.   90 tablet   1   . naproxen (NAPROSYN) 500 MG tablet   Oral   Take 1 tablet (500 mg total) by mouth 2 (two) times daily with a meal.   60 tablet   0   . nitroGLYCERIN (NITROSTAT) 0.4 MG SL tablet   Sublingual   Place 1 tablet (0.4 mg total) under the tongue every 5 (five) minutes as needed for chest pain.   25 tablet   2   . pantoprazole (PROTONIX) 40 MG tablet   Oral   Take 1 tablet (40 mg total) by mouth daily.   90 tablet   2   . rosuvastatin (CRESTOR) 5 MG tablet   Oral   Take 1 tablet (5 mg total) by mouth daily.   90 tablet   1  Allergies Simvastatin  Family History  Problem Relation Age of Onset  . Heart disease Father 57    Before age 76- Open Heart surgery  . Hyperlipidemia Mother     Social History Social History  Substance Use Topics  . Smoking status: Current Some Day Smoker -- 1.50 packs/day for 47 years    Types: Cigarettes    Last Attempt to Quit: 09/24/2013  . Smokeless tobacco: Current User    Types: Chew  . Alcohol Use: 1.2 oz/week    2 Cans of beer per week     Comment: occ    Review of Systems Constitutional: No recent illness. Eyes: Blurred vision in right eye with irritation. ENT: No sore throat. Cardiovascular: Denies chest pain or palpitations. Respiratory: Denies shortness of breath. Gastrointestinal: No abdominal pain.  Genitourinary: Negative for dysuria. Musculoskeletal: Pain in right side of neck. Skin: Negative for rash. Neurological: Negative for headaches, focal weakness or numbness. 10-point ROS otherwise  negative.  ____________________________________________   PHYSICAL EXAM:  VITAL SIGNS: ED Triage Vitals  Enc Vitals Group     BP 12/06/14 0822 136/78 mmHg     Pulse Rate 12/06/14 0822 69     Resp 12/06/14 0822 20     Temp 12/06/14 0822 97.5 F (36.4 C)     Temp Source 12/06/14 0822 Oral     SpO2 12/06/14 0822 100 %     Weight 12/06/14 0822 185 lb (83.915 kg)     Height 12/06/14 0822 5\' 11"  (1.803 m)     Head Cir --      Peak Flow --      Pain Score 12/06/14 0823 10     Pain Loc --      Pain Edu? --      Excl. in GC? --    Constitutional: Alert and oriented. Well appearing and in no acute distress. Eyes: Conjunctivae are erythematous. EOMI. No exudate. No abrasion noted on fluorescein exam. No foreign body identifies. Head: Atraumatic. Nose: No congestion/rhinnorhea. Neck: No stridor.  Respiratory: Normal respiratory effort.   Musculoskeletal: Tenderness to palpation over right lateral neck.  Neurologic:  Normal speech and language. No gross focal neurologic deficits are appreciated. Speech is normal. No gait instability. Skin:  Skin is warm, dry and intact. Atraumatic. Psychiatric: Mood and affect are normal. Speech and behavior are normal.  ____________________________________________   LABS (all labs ordered are listed, but only abnormal results are displayed)  Labs Reviewed - No data to display ____________________________________________  RADIOLOGY  Not indicated. ____________________________________________   PROCEDURES  Procedure(s) performed: Fluorescein Eye Exam, right eye.   ____________________________________________   INITIAL IMPRESSION / ASSESSMENT AND PLAN / ED COURSE  Pertinent labs & imaging results that were available during my care of the patient were reviewed by me and considered in my medical decision making (see chart for details).  No foreign body or abrasion noted to right eye. Eye was flushed with saline. Because of possible weld  burn, he will be treated with gentamycin and ketorolac opthalmic drops.   He received valium and toradol in the ER with partial relief of neck pain.   He was advised to follow up with opthalmology and primary care.  ____________________________________________   FINAL CLINICAL IMPRESSION(S) / ED DIAGNOSES  Final diagnoses:  Torticollis, acute  Sensation of foreign body in eye       Chinita Pester, FNP 12/06/14 1558  Jene Every, MD 12/08/14 [id number]

## 2015-01-03 ENCOUNTER — Other Ambulatory Visit: Payer: Self-pay | Admitting: Cardiology

## 2015-01-23 ENCOUNTER — Telehealth: Payer: Self-pay | Admitting: Cardiology

## 2015-01-23 NOTE — Telephone Encounter (Signed)
Patient would like to know if he is due to any test. He has met his deductible and would like to get all test done before insurance starts over.

## 2015-01-23 NOTE — Telephone Encounter (Signed)
LMTCB

## 2015-01-27 NOTE — Telephone Encounter (Signed)
No cardiac testing at this point [facility]

## 2015-01-27 NOTE — Telephone Encounter (Signed)
Forward to [facility] and Dr Jens Som Patient last visit was in 05/2014-- recommendation f/u 1 year

## 2015-01-28 NOTE — Telephone Encounter (Signed)
Spoke with pt, Aware of dr crenshaw's recommendations.  °

## 2015-05-07 ENCOUNTER — Other Ambulatory Visit: Payer: Self-pay | Admitting: Cardiology

## 2015-05-07 NOTE — Telephone Encounter (Signed)
°*  STAT* If patient is at the pharmacy, call can be transferred to refill team.   1. Which medications need to be refilled? (please list name of each medication and dose if known) Metoprolol 25mg , and Rosuvastatin 5mg    2. Which pharmacy/location (including street and city if local pharmacy) is medication to be sent to?[facility] on [address]  [place]   3. Do they need a 30 day or 90 day supply?90  Need new prescriptions sent

## 2015-05-08 MED ORDER — ROSUVASTATIN CALCIUM 5 MG PO TABS: 5.0000 mg | ORAL_TABLET | Freq: Every day | ORAL | Status: DC

## 2015-05-08 MED ORDER — METOPROLOL TARTRATE 25 MG PO TABS: 25.0000 mg | ORAL_TABLET | Freq: Two times a day (BID) | ORAL | Status: DC

## 2015-05-08 NOTE — Telephone Encounter (Signed)
Rx(s) sent to pharmacy electronically.  

## 2015-06-02 NOTE — Progress Notes (Signed)
      HPI: FU coronary disease. He underwent cardiac catheterization in 2007 and had a bare-metal stent to his LAD. He has residual right coronary artery disease. Last Myoview in Jan 2014 showed an ejection fraction of 65%. No ischemia. Also with cerebrovascular disease followed by vascular surgery. Since last seen, the patient denies any dyspnea on exertion, orthopnea, PND, pedal edema, palpitations, syncope or chest pain.   Current Outpatient Prescriptions  Medication Sig Dispense Refill  . aspirin 325 MG tablet Take 325 mg by mouth daily.      . metoprolol tartrate (LOPRESSOR) 25 MG tablet Take 1 tablet (25 mg total) by mouth 2 (two) times daily. 90 tablet 0  . naproxen (NAPROSYN) 500 MG tablet Take 1 tablet (500 mg total) by mouth 2 (two) times daily with a meal. 60 tablet 0  . nitroGLYCERIN (NITROSTAT) 0.4 MG SL tablet Place 1 tablet (0.4 mg total) under the tongue every 5 (five) minutes as needed for chest pain. 25 tablet 2  . pantoprazole (PROTONIX) 40 MG tablet TAKE 1 TABLET (40 MG TOTAL) BY MOUTH DAILY. 90 tablet 1  . rosuvastatin (CRESTOR) 5 MG tablet Take 1 tablet (5 mg total) by mouth daily. 90 tablet 0   No current facility-administered medications for this visit.     Past Medical History  Diagnosis Date  . HYPERCHOLESTEROLEMIA   . HYPERLIPIDEMIA   . GLAUCOMA   . OTITIS EXTERNA   . CORONARY ARTERY DISEASE   . COPD   . GERD   . BARRETTS ESOPHAGUS   . Other constipation   . COLONIC POLYPS, HX OF   . Peripheral vascular disease (HCC)   . Carotid artery occlusion     Past Surgical History  Procedure Laterality Date  . Umbilical hernia repair    . Esophagogastroduodenoscopy    . Coronary stent placement      Social History   Social History  . Marital Status: Married    Spouse Name: N/A  . Number of Children: N/A  . Years of Education: N/A   Occupational History  . Not on file.   Social History Main Topics  . Smoking status: Current Some Day Smoker --  1.50 packs/day for 47 years    Types: Cigarettes    Last Attempt to Quit: 09/24/2013  . Smokeless tobacco: Current User    Types: Chew  . Alcohol Use: 1.2 oz/week    2 Cans of beer per week     Comment: occ  . Drug Use: No  . Sexual Activity: Not on file   Other Topics Concern  . Not on file   Social History Narrative    Family History  Problem Relation Age of Onset  . Heart disease Father 8    Before age 45- Open Heart surgery  . Hyperlipidemia Mother     ROS: no fevers or chills, productive cough, hemoptysis, dysphasia, odynophagia, melena, hematochezia, dysuria, hematuria, rash, seizure activity, orthopnea, PND, pedal edema, claudication. Remaining systems are negative.  Physical Exam: Well-developed well-nourished in no acute distress.  Skin is warm and dry.  HEENT is normal.  Neck is supple.  Chest is clear to auscultation with normal expansion.  Cardiovascular exam is regular rate and rhythm.  Abdominal exam nontender or distended. No masses palpated. Extremities show no edema. neuro grossly intact  ECG Sinus rhythm with no ST changes.

## 2015-06-08 ENCOUNTER — Encounter: Payer: Self-pay | Admitting: Cardiology

## 2015-06-08 ENCOUNTER — Ambulatory Visit (INDEPENDENT_AMBULATORY_CARE_PROVIDER_SITE_OTHER): Payer: BLUE CROSS/BLUE SHIELD | Admitting: Cardiology

## 2015-06-08 DIAGNOSIS — I679 Cerebrovascular disease, unspecified: Secondary | ICD-10-CM

## 2015-06-08 DIAGNOSIS — I2510 Atherosclerotic heart disease of native coronary artery without angina pectoris: Secondary | ICD-10-CM

## 2015-06-08 MED ORDER — ROSUVASTATIN CALCIUM 40 MG PO TABS: 40.0000 mg | ORAL_TABLET | Freq: Every day | ORAL | Status: DC

## 2015-06-08 NOTE — Assessment & Plan Note (Signed)
Continue aspirin and statin. 

## 2015-06-08 NOTE — Assessment & Plan Note (Signed)
Continue aspirin and statin. Followed by vascular surgery. 

## 2015-06-08 NOTE — Assessment & Plan Note (Signed)
Given documented coronary disease increase Crestor to 40 mg daily. Check lipids and liver as well as CK in 4 weeks.

## 2015-06-08 NOTE — Assessment & Plan Note (Signed)
Blood pressure controlled. Continue present medications. 

## 2015-06-08 NOTE — Patient Instructions (Signed)
Medication Instructions:   INCREASE ROSUVASTATIN TO 40 MG ONCE DAILY= 8 OF THE 5 MG TABLETS ONCE DAILY  Labwork:  Your physician recommends that you return for lab work in: 4 WEEKS= DO NOT EAT PRIOR TO LAB WORK  Follow-Up:  Your physician wants you to follow-up in: ONE YEAR WITH DR Shelda Pal will receive a reminder letter in the mail two months in advance. If you don't receive a letter, please call our office to schedule the follow-up appointment.    If you need a refill on your cardiac medications before your next appointment, please call your pharmacy.

## 2015-07-02 ENCOUNTER — Telehealth: Payer: Self-pay | Admitting: Cardiology

## 2015-07-02 MED ORDER — PANTOPRAZOLE SODIUM 40 MG PO TBEC: DELAYED_RELEASE_TABLET | ORAL | Status: DC

## 2015-07-02 NOTE — Telephone Encounter (Signed)
°*  STAT* If patient is at the pharmacy, call can be transferred to refill team.   1. Which medications need to be refilled? (please list name of each medication and dose if known) Pantoprazole 40 mg  2. Which pharmacy/location (including street and city if local pharmacy) is medication to be sent to?[facility]   3. Do they need a 30 day or 90 day supply? 90

## 2015-07-02 NOTE — Telephone Encounter (Signed)
Rx(s) sent to pharmacy electronically.  

## 2015-08-05 ENCOUNTER — Other Ambulatory Visit: Payer: Self-pay | Admitting: Cardiology

## 2015-08-05 NOTE — Telephone Encounter (Signed)
Rx(s) sent to pharmacy electronically.  

## 2015-08-07 ENCOUNTER — Other Ambulatory Visit: Payer: Self-pay | Admitting: Cardiology

## 2015-08-07 NOTE — Telephone Encounter (Signed)
REFILL 

## 2015-10-01 ENCOUNTER — Encounter: Payer: Self-pay | Admitting: Family

## 2015-10-01 ENCOUNTER — Encounter: Payer: Self-pay | Admitting: *Deleted

## 2015-10-07 ENCOUNTER — Ambulatory Visit: Payer: BLUE CROSS/BLUE SHIELD | Admitting: Family

## 2015-10-07 ENCOUNTER — Ambulatory Visit (HOSPITAL_COMMUNITY): Payer: BLUE CROSS/BLUE SHIELD

## 2015-10-22 ENCOUNTER — Telehealth: Payer: Self-pay | Admitting: Cardiology

## 2015-10-22 NOTE — Telephone Encounter (Signed)
New Message  Pt requested to speak w/ RN about having labs done in [place]. Please call back and discuss.

## 2015-10-22 NOTE — Telephone Encounter (Signed)
Returned call and gave patient address and contact information for [place] [facility] location. He voiced thanks and acknowledgment.

## 2015-11-03 ENCOUNTER — Other Ambulatory Visit (INDEPENDENT_AMBULATORY_CARE_PROVIDER_SITE_OTHER): Payer: BLUE CROSS/BLUE SHIELD

## 2015-11-03 DIAGNOSIS — I2510 Atherosclerotic heart disease of native coronary artery without angina pectoris: Secondary | ICD-10-CM

## 2015-11-03 DIAGNOSIS — E785 Hyperlipidemia, unspecified: Secondary | ICD-10-CM

## 2015-11-04 ENCOUNTER — Encounter: Payer: Self-pay | Admitting: *Deleted

## 2015-11-04 LAB — LIPID PANEL
CHOLESTEROL TOTAL: 122 mg/dL (ref 100–199)
Chol/HDL Ratio: 3.5 ratio units (ref 0.0–5.0)
HDL: 35 mg/dL — ABNORMAL LOW (ref >39)
LDL CALC: 54 mg/dL (ref 0–99)
TRIGLYCERIDES: 164 mg/dL — AB (ref 0–149)
VLDL CHOLESTEROL CAL: 33 mg/dL (ref 5–40)

## 2015-11-04 LAB — HEPATIC FUNCTION PANEL
ALT: 17 IU/L (ref 0–44)
AST: 24 IU/L (ref 0–40)
Albumin: 4.4 g/dL (ref 3.6–4.8)
Alkaline Phosphatase: 76 IU/L (ref 39–117)
Bilirubin Total: 0.7 mg/dL (ref 0.0–1.2)
Bilirubin, Direct: 0.17 mg/dL (ref 0.00–0.40)
Total Protein: 7 g/dL (ref 6.0–8.5)

## 2015-11-12 ENCOUNTER — Encounter: Payer: Self-pay | Admitting: Family

## 2015-11-18 ENCOUNTER — Ambulatory Visit (INDEPENDENT_AMBULATORY_CARE_PROVIDER_SITE_OTHER): Payer: BLUE CROSS/BLUE SHIELD | Admitting: Family

## 2015-11-18 ENCOUNTER — Encounter: Payer: Self-pay | Admitting: Family

## 2015-11-18 ENCOUNTER — Ambulatory Visit (HOSPITAL_COMMUNITY)
Admission: RE | Admit: 2015-11-18 | Discharge: 2015-11-18 | Disposition: A | Discharge status: Home / Self Care | Payer: BLUE CROSS/BLUE SHIELD | Source: Ambulatory Visit | Attending: Family | Admitting: Family

## 2015-11-18 VITALS — BP 115/74 | HR 77 | Temp 98.1°F | Resp 14 | Ht 71.0 in | Wt 193.0 lb

## 2015-11-18 DIAGNOSIS — I6523 Occlusion and stenosis of bilateral carotid arteries: Secondary | ICD-10-CM

## 2015-11-18 DIAGNOSIS — I6529 Occlusion and stenosis of unspecified carotid artery: Secondary | ICD-10-CM

## 2015-11-18 DIAGNOSIS — Z87891 Personal history of nicotine dependence: Secondary | ICD-10-CM

## 2015-11-18 DIAGNOSIS — E7800 Pure hypercholesterolemia, unspecified: Secondary | ICD-10-CM | POA: Insufficient documentation

## 2015-11-18 DIAGNOSIS — K219 Gastro-esophageal reflux disease without esophagitis: Secondary | ICD-10-CM | POA: Diagnosis not present

## 2015-11-18 DIAGNOSIS — I2510 Atherosclerotic heart disease of native coronary artery without angina pectoris: Secondary | ICD-10-CM | POA: Insufficient documentation

## 2015-11-18 DIAGNOSIS — J449 Chronic obstructive pulmonary disease, unspecified: Secondary | ICD-10-CM | POA: Diagnosis not present

## 2015-11-18 DIAGNOSIS — Z720 Tobacco use: Secondary | ICD-10-CM

## 2015-11-18 NOTE — Patient Instructions (Addendum)
Stroke Prevention Some medical conditions and behaviors are associated with an increased chance of having a stroke. You may prevent a stroke by making healthy choices and managing medical conditions. HOW CAN I REDUCE MY RISK OF HAVING A STROKE?   Stay physically active. Get at least 30 minutes of activity on most or all days.  Do not smoke. It may also be helpful to avoid exposure to secondhand smoke.  Limit alcohol use. Moderate alcohol use is considered to be:  No more than 2 drinks per day for men.  No more than 1 drink per day for nonpregnant women.  Eat healthy foods. This involves:  Eating 5 or more servings of fruits and vegetables a day.  Making dietary changes that address high blood pressure (hypertension), high cholesterol, diabetes, or obesity.  Manage your cholesterol levels.  Making food choices that are high in fiber and low in saturated fat, trans fat, and cholesterol may control cholesterol levels.  Take any prescribed medicines to control cholesterol as directed by your health care provider.  Manage your diabetes.  Controlling your carbohydrate and sugar intake is recommended to manage diabetes.  Take any prescribed medicines to control diabetes as directed by your health care provider.  Control your hypertension.  Making food choices that are low in salt (sodium), saturated fat, trans fat, and cholesterol is recommended to manage hypertension.  Ask your health care provider if you need treatment to lower your blood pressure. Take any prescribed medicines to control hypertension as directed by your health care provider.  If you are 18-39 years of age, have your blood pressure checked every 3-5 years. If you are 40 years of age or older, have your blood pressure checked every year.  Maintain a healthy weight.  Reducing calorie intake and making food choices that are low in sodium, saturated fat, trans fat, and cholesterol are recommended to manage  weight.  Stop drug abuse.  Avoid taking birth control pills.  Talk to your health care provider about the risks of taking birth control pills if you are over 35 years old, smoke, get migraines, or have ever had a blood clot.  Get evaluated for sleep disorders (sleep apnea).  Talk to your health care provider about getting a sleep evaluation if you snore a lot or have excessive sleepiness.  Take medicines only as directed by your health care provider.  For some people, aspirin or blood thinners (anticoagulants) are helpful in reducing the risk of forming abnormal blood clots that can lead to stroke. If you have the irregular heart rhythm of atrial fibrillation, you should be on a blood thinner unless there is a good reason you cannot take them.  Understand all your medicine instructions.  Make sure that other conditions (such as anemia or atherosclerosis) are addressed. SEEK IMMEDIATE MEDICAL CARE IF:   You have sudden weakness or numbness of the face, arm, or leg, especially on one side of the body.  Your face or eyelid droops to one side.  You have sudden confusion.  You have trouble speaking (aphasia) or understanding.  You have sudden trouble seeing in one or both eyes.  You have sudden trouble walking.  You have dizziness.  You have a loss of balance or coordination.  You have a sudden, severe headache with no known cause.  You have new chest pain or an irregular heartbeat. Any of these symptoms may represent a serious problem that is an emergency. Do not wait to see if the symptoms will   go away. Get medical help at once. Call your local emergency services (911 in [place] Do not drive yourself to the hospital.   This information is not intended to replace advice given to you by your health care provider. Make sure you discuss any questions you have with your health care provider.   Document Released: 05/19/2004 Document Revised: 05/02/2014 Document Reviewed:  10/12/2012 Elsevier Interactive Patient Education 2016 Elsevier Inc.    Smokeless Tobacco Use Smokeless tobacco is a loose, fine, or stringy tobacco. The tobacco is not smoked like a cigarette, but it is chewed or held in the lips or cheeks. It resembles tea and comes from the leaves of the tobacco plant. Smokeless tobacco is usually flavored, sweetened, or processed in some way. Although smokeless tobacco is not smoked into the lungs, its chemicals are absorbed through the membranes in the mouth and into the bloodstream. Its chemicals are also swallowed in saliva. The chemicals (nicotine and other toxins) are known to cause cancer. Smokeless tobacco contains up to 28 differentcarcinogens. CAUSES Nicotine is addictive. Smokeless tobacco contains nicotine, which is a stimulant. This stimulant can give you a "buzz" or altered state. People can become addicted to the feeling it delivers.  SYMPTOMS Smokeless tobacco can cause health problems, including:  Bad breath.  Yellow-brown teeth.  Mouth sores.  Cracking and bleeding lips.  Gum disease, gum recession, and bone loss around the teeth.  Tooth decay.  Increased or irregular heart rate.  High blood pressure, heart disease, and stroke.  Cancer of the mouth, lips, tongue, pancreas, voice box (larynx), esophagus, colon, and bladder.  Precancerous lesion of the soft tissues of the mouth (leukoplakia).  Loss of your sense of taste. TREATMENT Talk with your caregiver about ways you can quit. Quitting tobacco is a good decision for your health. Nicotine is addictive, but several options are available to help you quit including:  Nicotine replacement therapy (gum or patch).  Support and cessation programs. The following tips can help you quit:  Write down the reasons you would like to quit and look at them often.  Set a date during a low stress time to stop or cut back.  Ask family and friends for their support.  Remove all  tobacco products from your home and work.  Replace the chewing tobacco with things like beef jerky, sunflower seeds, or shredded coconut.  Avoid situations that may make you want to chew tobacco.  Exercise and eat a healthy diet.  When you crave tobacco, distract yourself with drinking water, sugarless chewing gum, sugarless hard candy, exercising, or deep breathing. HOME CARE INSTRUCTIONS  See your dentist for regular oral health exams every 6 months.  Follow up with your caregiver as recommended. SEEK MEDICAL CARE OR DENTAL CARE IF:  You have bleeding or cracking lips, gums, or cheeks.  You have mouth sores, discolorations, or pain.  You have tooth pain.  You develop persistent irritation, burning, or sores in the mouth.  You have pain, tenderness, or numbness in the mouth.  You develop a lump, bumpy patch, or hardened skin inside the mouth.  The color changes inside your mouth (gray, white, or red spots).  You have difficulty chewing, swallowing, or speaking.   This information is not intended to replace advice given to you by your health care provider. Make sure you discuss any questions you have with your health care provider.   Document Released: 09/13/2010 Document Revised: 07/04/2011 Document Reviewed: 09/13/2010 Elsevier Interactive Patient Education 2016 Elsevier Inc.  

## 2015-11-18 NOTE — Progress Notes (Signed)
Chief Complaint: Follow up Extracranial Carotid Artery Stenosis   History of Present Illness  [name] [name] is a 65 y.o. male patient that Dr. Edilia Bo has been following for carotid artery stenosis.  Patient has not had previous carotid artery intervention.  He denies any history of TIA or stroke symptoms.specifically he denies a history of amaurosis fugax or monocular blindness, unilateral facial drooping, hemiplegia, or receptive or expressive aphasia.   He denies claudication symptoms with walking, denies history of MI, but had a cardiac stent placed.  Pt Diabetic: No Pt smoker: former smoker (quit about May 2015). "I chew a little tobacco"  Pt meds include: Statin :yes ASA: Yes Other anticoagulants/antiplatelets: no    Past Medical History:  Diagnosis Date  . BARRETTS ESOPHAGUS   . Carotid artery occlusion   . COLONIC POLYPS, HX OF   . COPD   . CORONARY ARTERY DISEASE   . GERD   . GLAUCOMA   . HYPERCHOLESTEROLEMIA   . HYPERLIPIDEMIA   . Other constipation   . OTITIS EXTERNA   . Peripheral vascular disease [facility])     Social History Social History  Substance Use Topics  . Smoking status: Current Some Day Smoker    Packs/day: 0.10    Years: 47.00    Types: Cigarettes    Last attempt to quit: 09/24/2013  . Smokeless tobacco: Current User    Types: Chew  . Alcohol use 1.2 oz/week    2 Cans of beer per week     Comment: occ    Family History Family History  Problem Relation Age of Onset  . Heart disease Father 35    Before age 32- Open Heart surgery  . Hyperlipidemia Mother     Surgical History Past Surgical History:  Procedure Laterality Date  . CORONARY STENT PLACEMENT    . ESOPHAGOGASTRODUODENOSCOPY    . UMBILICAL HERNIA REPAIR      Allergies  Allergen Reactions  . Simvastatin     Joint and muscle pain    Current Outpatient Prescriptions  Medication Sig Dispense Refill  . aspirin 325 MG tablet Take 325 mg by mouth daily.      .  metoprolol tartrate (LOPRESSOR) 25 MG tablet TAKE 1 TABLET(25 MG) BY MOUTH TWICE DAILY 180 tablet 3  . nitroGLYCERIN (NITROSTAT) 0.4 MG SL tablet Place 1 tablet (0.4 mg total) under the tongue every 5 (five) minutes as needed for chest pain. 25 tablet 2  . pantoprazole (PROTONIX) 40 MG tablet TAKE 1 TABLET (40 MG TOTAL) BY MOUTH DAILY. 90 tablet 3  . rosuvastatin (CRESTOR) 40 MG tablet Take 1 tablet (40 mg total) by mouth daily. 90 tablet 3   No current facility-administered medications for this visit.     Review of Systems : See HPI for pertinent positives and negatives.  Physical Examination  Vitals:   11/18/15 1544 11/18/15 1547  BP: 116/70 115/74  Pulse: 78 77  Resp: 14   Temp: 98.1 F (36.7 C)   TempSrc: Oral   SpO2: 94%   Weight: 193 lb (87.5 kg)   Height: 5\' 11"  (1.803 m)    Body mass index is 26.92 kg/m.  General: WDWN male in NAD GAIT: normal Eyes: PERRLA Pulmonary: Respirations are non-labored, CTAB Cardiac: regular rhythm, no detected murmur.  VASCULAR EXAM Carotid Bruits Left Right   Positive Negative  Aorta is not palpable Radial pulses are 2+ palpable and equal.      LE Pulses LEFT RIGHT   POPLITEAL not  palpable  not palpable   POSTERIOR TIBIAL  palpable   palpable    DORSALIS PEDIS  ANTERIOR TIBIAL palpable  palpable     Gastrointestinal: soft, nontender, BS WNL, no r/g, no palpated masses.  Musculoskeletal: Negative muscle atrophy/wasting. M/S 5/5 throughout, Extremities without ischemic changes.  Neurologic: A&O X 3; Appropriate Affect,  Speech is normal CN 2-12 intact, Pain and light touch intact in extremities, Motor exam as listed above.   Non-Invasive Vascular Imaging CAROTID DUPLEX 11/18/2015   CEREBROVASCULAR DUPLEX EVALUATION     INDICATION: Carotid artery disease     PREVIOUS INTERVENTION(S):     DUPLEX EXAM:     RIGHT  LEFT  Peak Systolic Velocities (cm/s) End Diastolic Velocities (cm/s) Plaque LOCATION Peak Systolic Velocities (cm/s) End Diastolic Velocities (cm/s) Plaque  108 24  CCA PROXIMAL 112 23   107 23  CCA MID 105 27   106 22  CCA DISTAL [id number] HT  149 26  ECA 194 18   107 22 HT ICA PROXIMAL 133 19 HT  93 29  ICA MID 94 25   107 33  ICA DISTAL 101 27      ICA / CCA Ratio (PSV)   Antegrade  Vertebral Flow Antegrade    Brachial Systolic Pressure (mmHg)   Multiphasic (Subclavian artery) Brachial Artery Waveforms Multiphasic (Subclavian artery)    Plaque Morphology:  HM = Homogeneous, HT = Heterogeneous, CP = Calcific Plaque, SP = Smooth Plaque, IP = Irregular Plaque     ADDITIONAL FINDINGS:     IMPRESSION: Bilateral internal carotid artery velocities suggest a <40% stenosis.  >50% stenosis of the left distal common carotid artery, flush with the bifurcation.    Compared to the previous exam:  No significant change in comparison to the last exam on 10/01/2014.      Assessment: PRATT BRESS is a 65 y.o. male who has no history of stroke or TIA. Today's carotid Duplex suggests bilateral internal carotid artery velocities suggest a <40% stenosis.  >50% stenosis of the left distal common carotid artery, flush with the bifurcation. No significant change in comparison to the last exam on 10/01/2014.   Fortunately he does not have DM and he quit smoking in May of 2015. He admits to using using smokeless tobacco and I counseled him against the use of this. I gave him several free resources re tobacco cessation.   Plan: Follow-up in 1 year with Carotid Duplex scan.   I discussed in depth with the patient the nature of atherosclerosis, and emphasized the importance of maximal medical management including strict control of blood pressure, blood glucose, and lipid levels, obtaining regular  exercise, and cessation of smokeless tobacco.  The patient is aware that without maximal medical management the underlying atherosclerotic disease process will progress, limiting the benefit of any interventions. The patient was given information about stroke prevention and what symptoms should prompt the patient to seek immediate medical care. Thank you for allowing [place] to participate in this patient's care.  Charisse March, RN, MSN, FNP-C [facility] Office: [phone]  Clinic Physician: Darrick Penna  11/18/15 4:13 PM

## 2016-02-24 ENCOUNTER — Encounter: Payer: Self-pay | Admitting: Student

## 2016-02-24 ENCOUNTER — Telehealth: Payer: Self-pay | Admitting: Cardiology

## 2016-02-24 ENCOUNTER — Encounter: Payer: Self-pay | Admitting: Cardiology

## 2016-02-24 ENCOUNTER — Ambulatory Visit (INDEPENDENT_AMBULATORY_CARE_PROVIDER_SITE_OTHER): Payer: BLUE CROSS/BLUE SHIELD | Admitting: Student

## 2016-02-24 VITALS — BP 123/64 | HR 78 | Ht 71.0 in | Wt 191.0 lb

## 2016-02-24 DIAGNOSIS — I2510 Atherosclerotic heart disease of native coronary artery without angina pectoris: Secondary | ICD-10-CM

## 2016-02-24 DIAGNOSIS — E785 Hyperlipidemia, unspecified: Secondary | ICD-10-CM

## 2016-02-24 DIAGNOSIS — I200 Unstable angina: Secondary | ICD-10-CM

## 2016-02-24 DIAGNOSIS — I10 Essential (primary) hypertension: Secondary | ICD-10-CM | POA: Diagnosis not present

## 2016-02-24 LAB — CBC
HCT: 41 % (ref 38.5–50.0)
Hemoglobin: 13.9 g/dL (ref 13.2–17.1)
MCH: 30.5 pg (ref 27.0–33.0)
MCHC: 33.9 g/dL (ref 32.0–36.0)
MCV: 90.1 fL (ref 80.0–100.0)
MPV: 10.1 fL (ref 7.5–12.5)
PLATELETS: 236 10*3/uL (ref 140–400)
RBC: 4.55 MIL/uL (ref 4.20–5.80)
RDW: 13.4 % (ref 11.0–15.0)
WBC: 11 10*3/uL — ABNORMAL HIGH (ref 3.8–10.8)

## 2016-02-24 LAB — BASIC METABOLIC PANEL WITH GFR
BUN: 14 mg/dL (ref 7–25)
CALCIUM: 9.3 mg/dL (ref 8.6–10.3)
CHLORIDE: 106 mmol/L (ref 98–110)
CO2: 23 mmol/L (ref 20–31)
CREATININE: 1.23 mg/dL (ref 0.70–1.25)
GFR, EST AFRICAN AMERICAN: 71 mL/min (ref >=60)
GFR, Est Non African American: 62 mL/min (ref >=60)
Glucose, Bld: 95 mg/dL (ref 65–99)
Potassium: 4.2 mmol/L (ref 3.5–5.3)
SODIUM: 141 mmol/L (ref 135–146)

## 2016-02-24 LAB — TSH: TSH: 0.78 m[IU]/L (ref 0.40–4.50)

## 2016-02-24 LAB — PROTIME-INR
INR: 1
PROTHROMBIN TIME: 10.5 s (ref 9.0–11.5)

## 2016-02-24 MED ORDER — ASPIRIN EC 81 MG PO TBEC: 81.0000 mg | DELAYED_RELEASE_TABLET | Freq: Every day | ORAL | 3 refills | Status: AC

## 2016-02-24 NOTE — Patient Instructions (Addendum)
Medication Instructions:  Decrease Aspirin to 81 mg daily.    Your physician has requested that you have a cardiac catheterization. Cardiac catheterization is used to diagnose and/or treat various heart conditions. Doctors may recommend this procedure for a number of different reasons. The most common reason is to evaluate chest pain. Chest pain can be a symptom of coronary artery disease (CAD), and cardiac catheterization can show whether plaque is narrowing or blocking your heart's arteries. This procedure is also used to evaluate the valves, as well as measure the blood flow and oxygen levels in different parts of your heart. For further information please visit [email or web address].   Following your catheterization, you will not be allowed to drive for 3 days.  No lifting, pushing, or pulling greater that 10 pounds is allowed for 1 week.  You will be required to have the following tests prior to the procedure:  1. Blood work-the blood work can be done no more than 14 days prior to the procedure.  It can be done at any [facility] lab.  There is one downstairs on the first floor of this building ([address]) and one in the [facility] building [phone]([address], [address]).

## 2016-02-24 NOTE — Progress Notes (Addendum)
Cardiology Office Note    Date:  02/24/2016   ID:  [name] [name], DOB 29-May-1950, MRN [id number]  PCP:  Tillman Abide, MD  Cardiologist: Dr. Jens Som  Chief Complaint  Patient presents with  . Follow-up    chest pressure with exertion    History of Present Illness:    [name] [name] is a 65 y.o. male with past medical history of CAD (s/p BMS to LAD in 2007), HTN, HLD, GERD, and carotid artery stenosis (followed by Vascular surgery) who presents to the office today for evaluation of chest pain.   Previous ischemic evaluation includes his cardiac catheterization in 2007 which showed 95% stenosis along the proximal-LAD, 40% D1, and 75% RCA stenosis. PCI was performed with a Liberte bare metal stent. Nuclear stress testing in 08/2008 and 04/2012 was low-risk.  Over the past 3 days, he has experienced a centralized chest pressure which radiates to his left pectoral region. This occurs when he is carrying heavy objects for long distances or lifting and stacking wood for his stove. He notes a mild diaphoresis. No associated dyspnea, nausea, or vomiting. He initially thought it was heartburn but has taken antiacids with no relief. Took "twice the normal dose" this morning with no improvement in his symptoms. Says he has known GERD but his current symptoms do not seem to match with this, for his pain is usually relived with antiacids but this has not helped. He sits down and rests, with his pain resolving. Denies any exertional chest discomfort or dyspnea with exertion prior to this week. Says he has overall felt well since his catheterization in 2007 until now. His chest discomfort and abdominal pain is similar to what he experienced in 2007, as he thought he had severe acid reflux for weeks, then later found out he had 95% stenosis.  Reports good compliance in his medications. Lipid Panel in 10/2015 showed total cholesterol 122, HDL, 35, and LDL 54. Quit smoking in 2015.   Past Medical History:    Diagnosis Date  . BARRETTS ESOPHAGUS   . Carotid artery occlusion   . COLONIC POLYPS, HX OF   . COPD   . CORONARY ARTERY DISEASE    a. 2007: cath showing 95% stenosis proximal LAD (BMS placed), 40% D1, and 75% RCA stenosis. b. NST 08/2008: no evidence of ischemia c. NST 04/2012: no evidence of ischemia.  Marland Kitchen GERD   . GLAUCOMA   . HYPERCHOLESTEROLEMIA   . HYPERLIPIDEMIA   . Other constipation   . OTITIS EXTERNA   . Peripheral vascular disease [facility])     Past Surgical History:  Procedure Laterality Date  . CORONARY STENT PLACEMENT    . ESOPHAGOGASTRODUODENOSCOPY    . UMBILICAL HERNIA REPAIR      Current Medications: Outpatient Medications Prior to Visit  Medication Sig Dispense Refill  . metoprolol tartrate (LOPRESSOR) 25 MG tablet TAKE 1 TABLET(25 MG) BY MOUTH TWICE DAILY 180 tablet 3  . nitroGLYCERIN (NITROSTAT) 0.4 MG SL tablet Place 1 tablet (0.4 mg total) under the tongue every 5 (five) minutes as needed for chest pain. 25 tablet 2  . pantoprazole (PROTONIX) 40 MG tablet TAKE 1 TABLET (40 MG TOTAL) BY MOUTH DAILY. 90 tablet 3  . rosuvastatin (CRESTOR) 40 MG tablet Take 1 tablet (40 mg total) by mouth daily. 90 tablet 3  . aspirin 325 MG tablet Take 325 mg by mouth daily.       No facility-administered medications prior to visit.  Allergies:   Simvastatin   Social History   Social History  . Marital status: Married    Spouse name: N/A  . Number of children: N/A  . Years of education: N/A   Social History Main Topics  . Smoking status: Former Smoker    Packs/day: 0.10    Years: 47.00    Types: Cigarettes    Quit date: 09/24/2013  . Smokeless tobacco: Current User    Types: Chew  . Alcohol use 1.2 oz/week    2 Cans of beer per week     Comment: occ  . Drug use: No  . Sexual activity: Not Asked   Other Topics Concern  . None   Social History Narrative  . None     Family History:  The patient's family history includes Heart disease (age of onset: 32)  in his father; Hyperlipidemia in his mother.   Review of Systems:   Please see the history of present illness.     General:  No chills, fever, night sweats or weight changes. Positive for diaphoresis. Cardiovascular:  No dyspnea on exertion, edema, orthopnea, palpitations, paroxysmal nocturnal dyspnea. Positive for chest pain with exertion.  Dermatological: No rash, lesions/masses Respiratory: No cough, dyspnea Urologic: No hematuria, dysuria Abdominal:   No nausea, vomiting, diarrhea, bright red blood per rectum, melena, or hematemesis Neurologic:  No visual changes, wkns, changes in mental status. All other systems reviewed and are otherwise negative except as noted above.  Physical Exam:    VS:  BP 123/64   Pulse 78   Ht 5\' 11"  (1.803 m)   Wt 191 lb (86.6 kg)   BMI 26.64 kg/m    General: Well developed, well nourished,male appearing in no acute distress. Head: Normocephalic, atraumatic, sclera non-icteric, no xanthomas, nares are without discharge.  Neck: No carotid bruits. JVD not elevated.  Lungs: Respirations regular and unlabored, without wheezes or rales.  Heart: Regular rate and rhythm. No S3 or S4.  No murmur, no rubs, or gallops appreciated. Pain not reproducible with palpation. Abdomen: Soft, non-tender, non-distended with normoactive bowel sounds. No hepatomegaly. No rebound/guarding. No obvious abdominal masses. Msk:  Strength and tone appear normal for age. No joint deformities or effusions. Extremities: No clubbing or cyanosis. No edema.  Distal pedal pulses are 2+ bilaterally. Right thumb wrapped in bandage. Neuro: Alert and oriented X 3. Moves all extremities spontaneously. No focal deficits noted. Psych:  Responds to questions appropriately with a normal affect. Skin: No rashes or lesions noted  Wt Readings from Last 3 Encounters:  02/24/16 191 lb (86.6 kg)  11/18/15 193 lb (87.5 kg)  06/08/15 194 lb (88 kg)     Studies/Labs Reviewed:   EKG:  EKG is  ordered today. The EKG ordered today demonstrates NSR, HR 78, PAC's, with no acute ST or T-wave changes.   Recent Labs: 11/03/2015: ALT 17   Lipid Panel    Component Value Date/Time   CHOL 122 11/03/2015 0911   TRIG 164 (H) 11/03/2015 0911   HDL 35 (L) 11/03/2015 0911   CHOLHDL 3.5 11/03/2015 0911   CHOLHDL 4 05/02/2012 0934   VLDL 35.4 05/02/2012 0934   LDLCALC 54 11/03/2015 0911   LDLDIRECT 96.2 01/27/2011 0841    Additional studies/ records that were reviewed today include:   Cardiac Catheterization: 02/2006 FINDINGS:  Aortic pressure 127/72 with a mean of 96; left ventricular  pressure 130/4 with an end-diastolic pressure of 15.   Coronary angiography, left mainstem is angiographically normal.  It  bifurcates into the LAD and left circumflex.  The LAD is a large  diameter vessel throughout its proximal course.  There is a long 95%  stenosis with ulcerated plaque present in the proximal vessel.  It is a  complex-appearing lesion.  Beyond the lesion, the vessel is a large  diameter vessel in the range of 3.5 mm.  At the branching point of the  first diagonal, the LAD changes in caliber.  It is a medium caliber  vessel beyond that point.  There is a 40% stenosis of the branch point  of the first diagonal and the mid LAD.  There is also 30% stenosis of  the first diagonal vessel at its ostium.  Both of these lesions appear  nonobstructive.  The remainder of the LAD is free of any significant  angiographic disease.   The left circumflex gives off a first marginal branch very early in its  course that supplies an intermediate territory.  There is no significant  angiographic disease in that vessel.  Left circumflex is medium caliber  and courses down the AV groove giving off a posterolateral branch.  There is no significant angiographic disease throughout the left  circumflex system.   The right coronary artery appears dominant.  There is diffuse disease  throughout.   There is a 75% stenosis in the mid right coronary artery.  The proximal vessel has 30% stenoses.  The distal vessel has a 40%  stenosis and a PDA branch has diffuse disease.   Left ventriculogram demonstrates normal left ventricular function with  an estimated left ventricular ejection fraction of 55%.   ASSESSMENT:  1. Severe single-vessel coronary artery disease with high-grade      proximal left anterior descending stenosis.  2. Moderate right coronary artery disease.  3. Normal left circumflex.  4. Normal LV function.   PLAN:  As described above, PCI to the proximal LAD with a Liberte bare  metal stent was performed.  There was an excellent angiographic result  with TIMI 3 flow following the procedure.  The patient will require  lifelong aspirin and 30 days of clopidogrel therapy.  He will continue  with aggressive risk factor modification with high-dose statin therapy  which was just started as well as beta blocker therapy.  If he has  recurrent chest pain, it would be reasonable to perform a stress test  and consider intervention on the right coronary artery.  However, I  suspect he will do well with medical therapy following treatment of his  proximal LAD.   Assessment:    1. Unstable angina (HCC)   2. CAD in native artery   3. Essential hypertension   4. Hyperlipidemia LDL goal <70      Plan:   In order of problems listed above:  1. Unstable Angina - has known CAD with BMS to proximal-LAD in 2007 and residual 75% stenosis in RCA and 40% along D1. Past two NST's have been low-risk with most recent in 2014. - presents today for evaluation of chest pressure with exertion, occurring when moving and hauling large load of firewood for the past 3 days. Associated diaphoresis and abdominal pain, saying he thought it might initially be acid reflux but no relief provided with antiacids. Chest discomfort resolves with rest. Not reproducible with palpation. Symptoms similar  to when he required stent placement in 2007. Denies any active chest discomfort at this time. - with presenting symptoms being most consistent with unstable angina and known CAD, will  proceed with cardiac catheterization for definitive evaluation. This was discussed with Dr. Royann Shiversroitoru (DOD) who was in agreement with this plan. The patient understands that risks included but are not limited to stroke (1 in 1000), death (1 in 1000), kidney failure [usually temporary] (1 in 500), bleeding (1 in 200), allergic reaction [possibly serious] (1 in 200). Has been arranged for next Monday, 02/29/2016 with Dr. [place]. Will obtain pre-procedure labs today. Patient has new Rx for SL NTG. Aware that if pain worsens or does not improve after 3rd SL NTG between now and the time of his cath, then he will need to proceed to the nearest Emergency Dept.  2. CAD - cardiac catheterization in 2007 showed 95% stenosis along the proximal-LAD, 40% D1, and 75% RCA stenosis. PCI was performed with BMS to LAD. - repeat catheterization as above with symptoms concerning for unstable angina. - continue ASA (reduce dosing from 325mg  daily to 81mg  daily), BB, and statin.   3. Essential HTN - BP well-controlled at today's visit. Reports SBP readings in the 110's at home.  - Continue Lopressor. Will not titrate dose at this time.   4. HLD - Lipid Panel in 10/2015 showed total cholesterol 122, HDL, 35, and LDL 54. - continue Crestor 40mg  daily.   Medication Adjustments/Labs and Tests Ordered: Current medicines are reviewed at length with the patient today.  Concerns regarding medicines are outlined above.  Medication changes, Labs and Tests ordered today are listed in the Patient Instructions below. Patient Instructions  Medication Instructions:  Decrease Aspirin to 81 mg daily.  Your physician has requested that you have a cardiac catheterization. Cardiac catheterization is used to diagnose and/or treat various heart conditions.  Doctors may recommend this procedure for a number of different reasons. The most common reason is to evaluate chest pain. Chest pain can be a symptom of coronary artery disease (CAD), and cardiac catheterization can show whether plaque is narrowing or blocking your heart's arteries. This procedure is also used to evaluate the valves, as well as measure the blood flow and oxygen levels in different parts of your heart. For further information please visit [email or web address].   Following your catheterization, you will not be allowed to drive for 3 days.  No lifting, pushing, or pulling greater that 10 pounds is allowed for 1 week.  You will be required to have the following tests prior to the procedure:  1. Blood work-the blood work can be done no more than 14 days prior to the procedure.  It can be done at any [facility] lab.  There is one downstairs on the first floor of this building ([address]) and one in the [facility] building [phone]([address], [address]).       Lorri FrederickSigned, Anae Hams M Makaelyn Aponte, [place]  02/24/2016 3:38 PM    [facility] [address], [address] [place], [place]  [address] Phone: [phone]; Fax: [phone]  [address], [address] [place], [place] [address] Phone: [phone]

## 2016-02-24 NOTE — Telephone Encounter (Signed)
Spoke with patient. He states over the past 3 days he has felt chest pressure only during exertion. 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 At rest, he denies this feeling and says he feels just fine. Denies chest pain, SOB, dizziness. Advised it would be good to be seen by a APP. [facility]  Pt verbalized understanding to go to the emergency room or call 911 if the chest pressure becomes more constant at rest and he develops other symptoms, such as SOB, nausea, vomiting, or sweating.

## 2016-02-24 NOTE — Telephone Encounter (Signed)
Patient's wife called and said that the patient is have chest pressure upon exertion over the past couple of days .  He has a stent.  He took nitro. This morning.  Wants to know if he should be seen today.

## 2016-02-25 ENCOUNTER — Emergency Department: Payer: BLUE CROSS/BLUE SHIELD

## 2016-02-25 ENCOUNTER — Encounter: Payer: Self-pay | Admitting: Emergency Medicine

## 2016-02-25 ENCOUNTER — Inpatient Hospital Stay
Admission: EM | Admit: 2016-02-25 | Discharge: 2016-02-27 | DRG: 247 | Disposition: A | Discharge status: Home / Self Care | Payer: BLUE CROSS/BLUE SHIELD | Attending: Internal Medicine | Admitting: Internal Medicine

## 2016-02-25 DIAGNOSIS — Z86010 Personal history of colonic polyps: Secondary | ICD-10-CM | POA: Diagnosis not present

## 2016-02-25 DIAGNOSIS — I252 Old myocardial infarction: Secondary | ICD-10-CM | POA: Diagnosis not present

## 2016-02-25 DIAGNOSIS — I2510 Atherosclerotic heart disease of native coronary artery without angina pectoris: Secondary | ICD-10-CM | POA: Diagnosis present

## 2016-02-25 DIAGNOSIS — H409 Unspecified glaucoma: Secondary | ICD-10-CM | POA: Diagnosis present

## 2016-02-25 DIAGNOSIS — K219 Gastro-esophageal reflux disease without esophagitis: Secondary | ICD-10-CM | POA: Diagnosis present

## 2016-02-25 DIAGNOSIS — E785 Hyperlipidemia, unspecified: Secondary | ICD-10-CM | POA: Diagnosis present

## 2016-02-25 DIAGNOSIS — J449 Chronic obstructive pulmonary disease, unspecified: Secondary | ICD-10-CM | POA: Diagnosis present

## 2016-02-25 DIAGNOSIS — Z87891 Personal history of nicotine dependence: Secondary | ICD-10-CM

## 2016-02-25 DIAGNOSIS — I739 Peripheral vascular disease, unspecified: Secondary | ICD-10-CM | POA: Diagnosis present

## 2016-02-25 DIAGNOSIS — R079 Chest pain, unspecified: Secondary | ICD-10-CM

## 2016-02-25 DIAGNOSIS — E7800 Pure hypercholesterolemia, unspecified: Secondary | ICD-10-CM | POA: Diagnosis present

## 2016-02-25 DIAGNOSIS — Z79899 Other long term (current) drug therapy: Secondary | ICD-10-CM | POA: Diagnosis not present

## 2016-02-25 DIAGNOSIS — I779 Disorder of arteries and arterioles, unspecified: Secondary | ICD-10-CM | POA: Diagnosis present

## 2016-02-25 DIAGNOSIS — I10 Essential (primary) hypertension: Secondary | ICD-10-CM | POA: Diagnosis present

## 2016-02-25 DIAGNOSIS — Z7982 Long term (current) use of aspirin: Secondary | ICD-10-CM

## 2016-02-25 DIAGNOSIS — I214 Non-ST elevation (NSTEMI) myocardial infarction: Principal | ICD-10-CM | POA: Diagnosis present

## 2016-02-25 DIAGNOSIS — Z9861 Coronary angioplasty status: Secondary | ICD-10-CM

## 2016-02-25 DIAGNOSIS — K5909 Other constipation: Secondary | ICD-10-CM | POA: Diagnosis present

## 2016-02-25 HISTORY — DX: Non-ST elevation (NSTEMI) myocardial infarction: I21.4

## 2016-02-25 LAB — BASIC METABOLIC PANEL
Anion gap: 8 (ref 5–15)
BUN: 14 mg/dL (ref 6–20)
CALCIUM: 9.2 mg/dL (ref 8.9–10.3)
CO2: 26 mmol/L (ref 22–32)
CREATININE: 1.16 mg/dL (ref 0.61–1.24)
Chloride: 106 mmol/L (ref 101–111)
Glucose, Bld: 105 mg/dL — ABNORMAL HIGH (ref 65–99)
Potassium: 4.1 mmol/L (ref 3.5–5.1)
SODIUM: 140 mmol/L (ref 135–145)

## 2016-02-25 LAB — TROPONIN I
TROPONIN I: 0.15 ng/mL — AB (ref <0.03)
TROPONIN I: 0.27 ng/mL — AB (ref <0.03)

## 2016-02-25 LAB — CBC
HCT: 41.4 % (ref 40.0–52.0)
Hemoglobin: 14.3 g/dL (ref 13.0–18.0)
MCH: 30.7 pg (ref 26.0–34.0)
MCHC: 34.5 g/dL (ref 32.0–36.0)
MCV: 89 fL (ref 80.0–100.0)
PLATELETS: 216 10*3/uL (ref 150–440)
RBC: 4.65 MIL/uL (ref 4.40–5.90)
RDW: 13 % (ref 11.5–14.5)
WBC: 9.6 10*3/uL (ref 3.8–10.6)

## 2016-02-25 LAB — PROTIME-INR
INR: 1.11
Prothrombin Time: 14.4 seconds (ref 11.4–15.2)

## 2016-02-25 LAB — APTT: aPTT: >160 seconds (ref 24–36)

## 2016-02-25 MED ORDER — ROSUVASTATIN CALCIUM 20 MG PO TABS
40.0000 mg | ORAL_TABLET | Freq: Every day | ORAL | Status: DC
  Administered 2016-02-25 – 2016-02-26 (×2): 40 mg via ORAL
  Filled 2016-02-25 (×2): qty 2
  Filled 2016-02-25: qty 1

## 2016-02-25 MED ORDER — ACETAMINOPHEN 650 MG RE SUPP: 650.0000 mg | Freq: Four times a day (QID) | RECTAL | Status: DC | PRN

## 2016-02-25 MED ORDER — ACETAMINOPHEN 325 MG PO TABS
650.0000 mg | ORAL_TABLET | Freq: Four times a day (QID) | ORAL | Status: DC | PRN
  Administered 2016-02-25: 650 mg via ORAL
  Filled 2016-02-25: qty 2

## 2016-02-25 MED ORDER — SODIUM CHLORIDE 0.9% FLUSH
3.0000 mL | Freq: Two times a day (BID) | INTRAVENOUS | Status: DC
  Administered 2016-02-25 – 2016-02-26 (×2): 3 mL via INTRAVENOUS

## 2016-02-25 MED ORDER — SODIUM CHLORIDE 0.9 % IV SOLN: 250.0000 mL | INTRAVENOUS | Status: DC | PRN

## 2016-02-25 MED ORDER — ASPIRIN 81 MG PO CHEW
324.0000 mg | CHEWABLE_TABLET | Freq: Once | ORAL | Status: AC
  Administered 2016-02-25: 324 mg via ORAL
  Filled 2016-02-25: qty 4

## 2016-02-25 MED ORDER — ONDANSETRON HCL 4 MG/2ML IJ SOLN: 4.0000 mg | Freq: Four times a day (QID) | INTRAMUSCULAR | Status: DC | PRN

## 2016-02-25 MED ORDER — METOPROLOL TARTRATE 25 MG PO TABS
25.0000 mg | ORAL_TABLET | Freq: Two times a day (BID) | ORAL | Status: DC
  Administered 2016-02-25 – 2016-02-27 (×3): 25 mg via ORAL
  Filled 2016-02-25 (×3): qty 1

## 2016-02-25 MED ORDER — PANTOPRAZOLE SODIUM 40 MG PO TBEC
40.0000 mg | DELAYED_RELEASE_TABLET | Freq: Every day | ORAL | Status: DC
  Administered 2016-02-25 – 2016-02-26 (×2): 40 mg via ORAL
  Filled 2016-02-25 (×2): qty 1

## 2016-02-25 MED ORDER — SODIUM CHLORIDE 0.9% FLUSH: 3.0000 mL | INTRAVENOUS | Status: DC | PRN

## 2016-02-25 MED ORDER — POLYETHYLENE GLYCOL 3350 17 G PO PACK: 17.0000 g | PACK | Freq: Every day | ORAL | Status: DC | PRN

## 2016-02-25 MED ORDER — NAPROXEN 250 MG PO TABS
250.0000 mg | ORAL_TABLET | Freq: Every evening | ORAL | Status: DC | PRN
  Filled 2016-02-25: qty 2

## 2016-02-25 MED ORDER — HEPARIN BOLUS VIA INFUSION
4000.0000 [IU] | Freq: Once | INTRAVENOUS | Status: AC
  Administered 2016-02-25: 4000 [IU] via INTRAVENOUS
  Filled 2016-02-25: qty 4000

## 2016-02-25 MED ORDER — ONDANSETRON HCL 4 MG PO TABS
4.0000 mg | ORAL_TABLET | Freq: Four times a day (QID) | ORAL | Status: DC | PRN
Start: 2016-02-25 — End: 2016-02-27

## 2016-02-25 MED ORDER — ALBUTEROL SULFATE (2.5 MG/3ML) 0.083% IN NEBU: 2.5000 mg | INHALATION_SOLUTION | RESPIRATORY_TRACT | Status: DC | PRN

## 2016-02-25 MED ORDER — NITROGLYCERIN 0.4 MG SL SUBL
0.4000 mg | SUBLINGUAL_TABLET | SUBLINGUAL | Status: DC | PRN
  Administered 2016-02-25 – 2016-02-26 (×2): 0.4 mg via SUBLINGUAL
  Filled 2016-02-25 (×2): qty 1

## 2016-02-25 MED ORDER — HEPARIN (PORCINE) IN NACL 100-0.45 UNIT/ML-% IJ SOLN
1050.0000 [IU]/h | INTRAMUSCULAR | Status: DC
  Administered 2016-02-25: 1050 [IU]/h via INTRAVENOUS
  Filled 2016-02-25 (×2): qty 250

## 2016-02-25 MED ORDER — ASPIRIN EC 81 MG PO TBEC
81.0000 mg | DELAYED_RELEASE_TABLET | Freq: Every day | ORAL | Status: DC
  Administered 2016-02-27: 81 mg via ORAL
  Filled 2016-02-25: qty 1

## 2016-02-25 MED ORDER — SODIUM CHLORIDE 0.9% FLUSH
3.0000 mL | Freq: Two times a day (BID) | INTRAVENOUS | Status: DC
  Administered 2016-02-27: 3 mL via INTRAVENOUS

## 2016-02-25 NOTE — Consult Note (Signed)
ANTICOAGULATION CONSULT NOTE - Initial Consult  Pharmacy Consult for heparin drip Indication: chest pain/ACS  No Known Allergies  Patient Measurements: Height: 5\' 11"  (180.3 cm) Weight: 191 lb (86.6 kg) IBW/kg (Calculated) : 75.3 Heparin Dosing Weight: 86.6kg  Vital Signs: Temp: 98.5 F (36.9 C) (11/02 1620) Temp Source: Oral (11/02 1620) BP: 139/77 (11/02 1822) Pulse Rate: 72 (11/02 1620)  Labs:  Recent Labs  02/24/16 1507 02/25/16 1615  HGB 13.9 14.3  HCT 41.0 41.4  PLT 236 216  LABPROT 10.5  --   INR 1.0  --   CREATININE 1.23 1.16  TROPONINI  --  0.15*    Estimated Creatinine Clearance: 68.5 mL/min (by C-G formula based on SCr of 1.16 mg/dL).   Medical History: Past Medical History:  Diagnosis Date  . BARRETTS ESOPHAGUS   . Carotid artery occlusion   . COLONIC POLYPS, HX OF   . COPD   . CORONARY ARTERY DISEASE    a. 2007: cath showing 95% stenosis proximal LAD (BMS placed), 40% D1, and 75% RCA stenosis. b. NST 08/2008: no evidence of ischemia c. NST 04/2012: no evidence of ischemia.  Marland Kitchen. GERD   . GLAUCOMA   . HYPERCHOLESTEROLEMIA   . HYPERLIPIDEMIA   . NSTEMI (non-ST elevated myocardial infarction) (HCC) 02/25/2016  . Other constipation   . OTITIS EXTERNA   . Peripheral vascular disease (HCC)     Medications:  Scheduled:  . [START ON 02/26/2016] aspirin EC  81 mg Oral Daily  . heparin  4,000 Units Intravenous Once  . metoprolol tartrate  25 mg Oral BID  . pantoprazole  40 mg Oral QHS  . rosuvastatin  40 mg Oral QHS  . sodium chloride flush  3 mL Intravenous Q12H  . sodium chloride flush  3 mL Intravenous Q12H    Assessment: Pt is a 65 year old male who presents with chest pain. Pharmacy consulted to dose heparin for a NSTEMI. No home anticoagulation found. Baseline CBC WNL. Add on APTT and INR ordered.  Goal of Therapy:  Heparin level 0.3-0.7 units/ml Monitor platelets by anticoagulation protocol: Yes   Plan:  Give 4000 units bolus x 1 Start  heparin infusion at 1050 units/hr Check anti-Xa level in 6 hours and daily while on heparin Continue to monitor H&H and platelets  [name] [name] [name] [name] 02/25/2016,6:26 PM

## 2016-02-25 NOTE — H&P (Signed)
[facility] [facility] [place] [facility]   PATIENT NAME: [name] [name]    MR#:  [id number]  DATE OF BIRTH:  1950-12-02  DATE OF ADMISSION:  02/25/2016  PRIMARY CARE PHYSICIAN: Tillman Abide, MD   REQUESTING/REFERRING PHYSICIAN: Dr. Derrill Kay  CHIEF COMPLAINT:   Chief Complaint  Patient presents with  . Chest Pain    HISTORY OF PRESENT ILLNESS:  [name] [name] is a 65 y.o. male with past medical history of CAD (s/p BMS to LAD in 2007), HTN, HLD, GERD, and carotid artery stenosis Presents to the emergency room with chest pain  Over the past 3 days, he has experienced a centralized chest pressure which radiates to his left pectoral region. This occurs when he is carrying heavy objects for long distances or lifting and stacking wood for his stove. He notes a mild diaphoresis. No associated dyspnea, nausea, or vomiting. He initially thought it was heartburn but has taken antiacids with no relief. Today patient had same symptoms at rest and presented to the emergency room. EKG shows nonspecific changes. Troponin elevated at 0.15 and patient is being admitted for non-ST elevation MI.  Previous ischemic evaluation includes his cardiac catheterization in 2007 which showed 95% stenosis along the proximal-LAD, 40% D1, and 75% RCA stenosis. PCI was performed with a Liberte bare metal stent. Nuclear stress testing in 08/2008 and 04/2012 was low-risk.  He was seen at Dr. Ludwig Clarks office 02/24/2016 and scheduled for cardiac catheterization on 02/29/2016.  PAST MEDICAL HISTORY:   Past Medical History:  Diagnosis Date  . BARRETTS ESOPHAGUS   . Carotid artery occlusion   . COLONIC POLYPS, HX OF   . COPD   . CORONARY ARTERY DISEASE    a. 2007: cath showing 95% stenosis proximal LAD (BMS placed), 40% D1, and 75% RCA stenosis. b. NST 08/2008: no evidence of ischemia c. NST 04/2012: no evidence of ischemia.  Marland Kitchen GERD   . GLAUCOMA   . HYPERCHOLESTEROLEMIA   . HYPERLIPIDEMIA   . Other  constipation   . OTITIS EXTERNA   . Peripheral vascular disease (HCC)     PAST SURGICAL HISTORY:   Past Surgical History:  Procedure Laterality Date  . CORONARY STENT PLACEMENT    . ESOPHAGOGASTRODUODENOSCOPY    . UMBILICAL HERNIA REPAIR      SOCIAL HISTORY:   Social History  Substance Use Topics  . Smoking status: Former Smoker    Packs/day: 0.10    Years: 47.00    Types: Cigarettes    Quit date: 09/24/2013  . Smokeless tobacco: Current User    Types: Chew  . Alcohol use 1.2 oz/week    2 Cans of beer per week     Comment: occ    FAMILY HISTORY:   Family History  Problem Relation Age of Onset  . Heart disease Father 29    Before age 36- Open Heart surgery  . Hyperlipidemia Mother     DRUG ALLERGIES:  No Known Allergies  REVIEW OF SYSTEMS:   Review of Systems  Constitutional: Positive for malaise/fatigue. Negative for chills, fever and weight loss.  HENT: Negative for hearing loss and nosebleeds.   Eyes: Negative for blurred vision, double vision and pain.  Respiratory: Positive for shortness of breath. Negative for cough, hemoptysis, sputum production and wheezing.   Cardiovascular: Positive for chest pain. Negative for palpitations, orthopnea and leg swelling.  Gastrointestinal: Negative for abdominal pain, constipation, diarrhea, nausea and vomiting.  Genitourinary: Negative for dysuria and hematuria.  Musculoskeletal: Negative for  back pain, falls and myalgias.  Skin: Negative for rash.  Neurological: Positive for weakness. Negative for dizziness, tremors, sensory change, speech change, focal weakness, seizures and headaches.  Endo/Heme/Allergies: Does not bruise/bleed easily.  Psychiatric/Behavioral: Negative for depression and memory loss. The patient is not nervous/anxious.     MEDICATIONS AT HOME:   Prior to Admission medications   Medication Sig Start Date End Date Taking? Authorizing Provider  aspirin 325 MG tablet Take 325 mg by mouth daily.    Yes Historical Provider, MD  aspirin EC 81 MG tablet Take 1 tablet (81 mg total) by mouth daily. 02/24/16  Yes Ellsworth LennoxBrittany M Strader, PA  metoprolol tartrate (LOPRESSOR) 25 MG tablet TAKE 1 TABLET(25 MG) BY MOUTH TWICE DAILY 08/07/15  Yes Lewayne BuntingBrian S Crenshaw, MD  Multiple Vitamin (MULTIVITAMIN WITH MINERALS) TABS tablet Take 1 tablet by mouth daily.   Yes Historical Provider, MD  naproxen sodium (ANAPROX) 220 MG tablet Take 220-440 mg by mouth at bedtime as needed (leg cramps).   Yes Historical Provider, MD  nitroGLYCERIN (NITROSTAT) 0.4 MG SL tablet Place 1 tablet (0.4 mg total) under the tongue every 5 (five) minutes as needed for chest pain. 03/31/14  Yes Brittainy Sherlynn CarbonM Simmons, PA-C  pantoprazole (PROTONIX) 40 MG tablet TAKE 1 TABLET (40 MG TOTAL) BY MOUTH DAILY. Patient taking differently: Take 40 mg by mouth daily as needed (acid reflux).  07/02/15  Yes Lewayne BuntingBrian S Crenshaw, MD  rosuvastatin (CRESTOR) 40 MG tablet Take 1 tablet (40 mg total) by mouth daily. Patient taking differently: Take 40 mg by mouth at bedtime.  06/08/15  Yes Lewayne BuntingBrian S Crenshaw, MD     VITAL SIGNS:  Blood pressure 131/71, pulse 72, temperature 98.5 F (36.9 C), temperature source Oral, resp. rate 17, height 5\' 11"  (1.803 m), weight 86.6 kg (191 lb), SpO2 95 %.  PHYSICAL EXAMINATION:  Physical Exam  GENERAL:  65 y.o.-year-old patient lying in the bed with no acute distress.  EYES: Pupils equal, round, reactive to light and accommodation. No scleral icterus. Extraocular muscles intact.  HEENT: Head atraumatic, normocephalic. Oropharynx and nasopharynx clear. No oropharyngeal erythema, moist oral mucosa  NECK:  Supple, no jugular venous distention. No thyroid enlargement, no tenderness.  LUNGS: Normal breath sounds bilaterally, no wheezing, rales, rhonchi. No use of accessory muscles of respiration.  CARDIOVASCULAR: S1, S2 normal. No murmurs, rubs, or gallops. Left carotid bruit. ABDOMEN: Soft, nontender, nondistended. Bowel sounds  present. No organomegaly or mass.  EXTREMITIES: No pedal edema, cyanosis, or clubbing. + 2 pedal & radial pulses b/l.   NEUROLOGIC: Cranial nerves II through XII are intact. No focal Motor or sensory deficits appreciated b/l PSYCHIATRIC: The patient is alert and oriented x 3. Good affect.  SKIN: No obvious rash, lesion, or ulcer.   LABORATORY PANEL:   CBC  Recent Labs Lab 02/25/16 1615  WBC 9.6  HGB 14.3  HCT 41.4  PLT 216   ------------------------------------------------------------------------------------------------------------------  Chemistries   Recent Labs Lab 02/25/16 1615  NA 140  K 4.1  CL 106  CO2 26  GLUCOSE 105*  BUN 14  CREATININE 1.16  CALCIUM 9.2   ------------------------------------------------------------------------------------------------------------------  Cardiac Enzymes  Recent Labs Lab 02/25/16 1615  TROPONINI 0.15*   ------------------------------------------------------------------------------------------------------------------  RADIOLOGY:  Dg Chest 2 View  Result Date: 02/25/2016 CLINICAL DATA:  Patient reports central CP x4-5 days. Denies SOB. Hx HTN, GERD, stent placement. Ex-smoker. EXAM: CHEST  2 VIEW COMPARISON:  06/21/2013 FINDINGS: Heart size is normal. There is mild prominence of interstitial markings. There  are no focal consolidations or pleural effusions. No pulmonary edema. IMPRESSION: No evidence for acute cardiopulmonary abnormality. Electronically Signed   By: Norva PavlovElizabeth  Brown M.D.   On: 02/25/2016 16:44    IMPRESSION AND PLAN:   * NSTEMI ASA, Statin, BB. Heparin drip. Echo. Consult cardiology for catheterization  * Hypertension Home meds  * COPD Stable  All the records are reviewed and case discussed with ED provider. Management plans discussed with the patient, family and they are in agreement.  CODE STATUS: FULL CODE  TOTAL TIME TAKING CARE OF THIS PATIENT: 40 minutes.   Milagros LollSudini, Sahian Kerney R M.D on  02/25/2016 at 5:25 PM  Between 7am to 6pm - Pager - [phone]  After 6pm go to [email or web address] - password [email or web address] [facility]  [facility]  Office  [phone]  CC: Primary care physician; Tillman Abideichard Letvak, MD  Note: This dictation was prepared with Dragon dictation along with smaller phrase technology. Any transcriptional errors that result from this process are unintentional.

## 2016-02-25 NOTE — ED Notes (Signed)
Dr. Derrill KayGoodman notified of troponin 0.15

## 2016-02-25 NOTE — Progress Notes (Signed)
CRITICAL VALUE ALERT  Critical value received: PTT >160  Date of notification:  02/25/2016  Time of notification: 2210 Critical value read back: yes  Nurse who received alert:  Wallace KellerAlisa Pate Aylward notified Eden LatheLexi Miller  Pharmacist notified (1st page):  2223  Time of first page:  na  MD notified (2nd page):na  Time of second page:na  Responding MD: Pharmacist Matt : Will await pending Heparin level at 0100 since Heparin drip started at 2032.  Time Pharmacist responded:  2223

## 2016-02-25 NOTE — ED Triage Notes (Signed)
Pt reports chest pain x4 days. Reports saw Cardiologist on yesterday and has cath schedule on Monday. Pt reports pain was upon exertion, now CP is consistent with rest. Pt reports shortness of breath.

## 2016-02-25 NOTE — ED Provider Notes (Signed)
[facility] Emergency Department Provider Note   ____________________________________________   I have reviewed the triage vital signs and the nursing notes.   HISTORY  Chief Complaint Chest Pain   History limited by: Not Limited   HPI [name] [name] is a 65 y.o. male with history of CAD s/p stent placement who presents to the emergency department today because of concern for chest pain. The patient states it is located centrally. He started having pain with exertion roughly 5 days ago. Went to his cardiologist yesterday and had a catheterization scheduled for Monday. Today however the pain started without any exertion. In addition the patient states that he had some accompanying shortness of breath. Denies any fevers.    Past Medical History:  Diagnosis Date  . BARRETTS ESOPHAGUS   . Carotid artery occlusion   . COLONIC POLYPS, HX OF   . COPD   . CORONARY ARTERY DISEASE    a. 2007: cath showing 95% stenosis proximal LAD (BMS placed), 40% D1, and 75% RCA stenosis. b. NST 08/2008: no evidence of ischemia c. NST 04/2012: no evidence of ischemia.  Marland Kitchen. GERD   . GLAUCOMA   . HYPERCHOLESTEROLEMIA   . HYPERLIPIDEMIA   . Other constipation   . OTITIS EXTERNA   . Peripheral vascular disease [facility](HCC)     Patient Active Problem List   Diagnosis Date Noted  . Cellulitis of arm, right 09/09/2013  . Occlusion and stenosis of carotid artery without mention of cerebral infarction 03/14/2012  . Cerebrovascular disease 01/27/2011  . Hypertension 01/27/2011  . CAROTID BRUIT 01/26/2010  . TOBACCO ABUSE 08/27/2008  . Hyperlipidemia LDL goal <70 04/15/2007  . HYPERCHOLESTEROLEMIA 04/11/2007  . GLAUCOMA 04/11/2007  . Coronary atherosclerosis 04/11/2007  . COPD 04/11/2007  . GERD 04/11/2007  . BARRETTS ESOPHAGUS 04/11/2007  . COLONIC POLYPS, HX OF 04/11/2007    Past Surgical History:  Procedure Laterality Date  . CORONARY STENT PLACEMENT    .  ESOPHAGOGASTRODUODENOSCOPY    . UMBILICAL HERNIA REPAIR      Prior to Admission medications   Medication Sig Start Date End Date Taking? Authorizing Provider  aspirin EC 81 MG tablet Take 1 tablet (81 mg total) by mouth daily. 02/24/16   Ellsworth LennoxBrittany M Strader, PA  metoprolol tartrate (LOPRESSOR) 25 MG tablet TAKE 1 TABLET(25 MG) BY MOUTH TWICE DAILY 08/07/15   Lewayne BuntingBrian S Crenshaw, MD  Multiple Vitamin (MULTIVITAMIN WITH MINERALS) TABS tablet Take 1 tablet by mouth daily.    Historical Provider, MD  naproxen sodium (ANAPROX) 220 MG tablet Take 220-440 mg by mouth at bedtime as needed (leg cramps).    Historical Provider, MD  nitroGLYCERIN (NITROSTAT) 0.4 MG SL tablet Place 1 tablet (0.4 mg total) under the tongue every 5 (five) minutes as needed for chest pain. 03/31/14   Brittainy Sherlynn CarbonM Simmons, PA-C  pantoprazole (PROTONIX) 40 MG tablet TAKE 1 TABLET (40 MG TOTAL) BY MOUTH DAILY. Patient taking differently: Take 40 mg by mouth daily as needed (acid reflux).  07/02/15   Lewayne BuntingBrian S Crenshaw, MD  rosuvastatin (CRESTOR) 40 MG tablet Take 1 tablet (40 mg total) by mouth daily. 06/08/15   Lewayne BuntingBrian S Crenshaw, MD    Allergies Review of patient's allergies indicates no active allergies.  Family History  Problem Relation Age of Onset  . Heart disease Father 3546    Before age 64- Open Heart surgery  . Hyperlipidemia Mother     Social History Social History  Substance Use Topics  . Smoking status: Former  Smoker    Packs/day: 0.10    Years: 47.00    Types: Cigarettes    Quit date: 09/24/2013  . Smokeless tobacco: Current User    Types: Chew  . Alcohol use 1.2 oz/week    2 Cans of beer per week     Comment: occ    Review of Systems  Constitutional: Negative for fever. Cardiovascular: Positive for chest pain. Respiratory: Positive for shortness of breath. Gastrointestinal: Negative for abdominal pain, vomiting and diarrhea. Genitourinary: Negative for dysuria. Musculoskeletal: Negative for back  pain. Skin: Negative for rash. Neurological: Negative for headaches, focal weakness or numbness.  10-point ROS otherwise negative.  ____________________________________________   PHYSICAL EXAM:  VITAL SIGNS: ED Triage Vitals  Enc Vitals Group     BP 02/25/16 1620 131/71     Pulse Rate 02/25/16 1620 72     Resp 02/25/16 1620 17     Temp 02/25/16 1620 98.5 F (36.9 C)     Temp Source 02/25/16 1620 Oral     SpO2 02/25/16 1620 95 %     Weight 02/25/16 1613 191 lb (86.6 kg)     Height 02/25/16 1613 5\' 11"  (1.803 m)     Head Circumference --      Peak Flow --      Pain Score 02/25/16 1613 3     Pain Loc --    Constitutional: Alert and oriented. Well appearing and in no distress. Eyes: Conjunctivae are normal. Normal extraocular movements. ENT   Head: Normocephalic and atraumatic.   Nose: No congestion/rhinnorhea.   Mouth/Throat: Mucous membranes are moist.   Neck: No stridor. Hematological/Lymphatic/Immunilogical: No cervical lymphadenopathy. Cardiovascular: Normal rate, regular rhythm.  No murmurs, rubs, or gallops. Respiratory: Normal respiratory effort without tachypnea nor retractions. Breath sounds are clear and equal bilaterally. No wheezes/rales/rhonchi. Gastrointestinal: Soft and nontender. No distention.  Genitourinary: Deferred Musculoskeletal: Normal range of motion in all extremities. No lower extremity edema. Neurologic:  Normal speech and language. No gross focal neurologic deficits are appreciated.  Skin:  Skin is warm, dry and intact. No rash noted. Psychiatric: Mood and affect are normal. Speech and behavior are normal. Patient exhibits appropriate insight and judgment.  ____________________________________________    LABS (pertinent positives/negatives)  Labs Reviewed  BASIC METABOLIC PANEL - Abnormal; Notable for the following:       Result Value   Glucose, Bld 105 (*)    All other components within normal limits  TROPONIN I - Abnormal;  Notable for the following:    Troponin I 0.15 (*)    All other components within normal limits  CBC     ____________________________________________   EKG  I, Phineas SemenGraydon Gabriele Loveland, attending physician, personally viewed and interpreted this EKG  EKG Time: 1612 Rate: 74 Rhythm: normal sinus rhythm Axis: normal Intervals: qtc 452 QRS: narrow ST changes: no st elevation Impression: normal ekg   ____________________________________________    RADIOLOGY  CXR IMPRESSION:  No evidence for acute cardiopulmonary abnormality.    ____________________________________________   PROCEDURES  Procedures  ____________________________________________   INITIAL IMPRESSION / ASSESSMENT AND PLAN / ED COURSE  Pertinent labs & imaging results that were available during my care of the patient were reviewed by me and considered in my medical decision making (see chart for details).  Patient presented for chest pain. Patient with history of CAD. Troponin was elevated. EKG without ST elevation. Will plan on admission to the hospitalist service.   ____________________________________________   FINAL CLINICAL IMPRESSION(S) / ED DIAGNOSES  Final diagnoses:  Chest pain, unspecified type  NSTEMI (non-ST elevated myocardial infarction) [facility])     Note: This dictation was prepared with Dragon dictation. Any transcriptional errors that result from this process are unintentional    Phineas Semen, MD 02/25/16 1730

## 2016-02-25 NOTE — Progress Notes (Signed)
Patient admitted to unit. Oriented to room, call bell, and staff. Bed in lowest position. Fall safety plan reviewed. Full assessment to Epic. Skin assessment verified with Dorna MaiJancy RN. Telemetry box verification with tele clerk and Boneta LucksJenny NT- Box#: [id number]. Will continue to monitor.

## 2016-02-26 ENCOUNTER — Encounter: Payer: Self-pay | Admitting: Cardiovascular Disease

## 2016-02-26 ENCOUNTER — Encounter
Admission: EM | Disposition: A | Discharge status: Home / Self Care | Payer: Self-pay | Source: Home / Self Care | Attending: Internal Medicine

## 2016-02-26 ENCOUNTER — Other Ambulatory Visit: Payer: Self-pay

## 2016-02-26 DIAGNOSIS — I214 Non-ST elevation (NSTEMI) myocardial infarction: Principal | ICD-10-CM

## 2016-02-26 DIAGNOSIS — I2510 Atherosclerotic heart disease of native coronary artery without angina pectoris: Secondary | ICD-10-CM

## 2016-02-26 HISTORY — PX: CARDIAC CATHETERIZATION: SHX172

## 2016-02-26 LAB — LIPID PANEL
CHOL/HDL RATIO: 2.9 ratio
CHOLESTEROL: 99 mg/dL (ref 0–200)
HDL: 34 mg/dL — AB (ref >40)
LDL Cholesterol: 48 mg/dL (ref 0–99)
Triglycerides: 87 mg/dL (ref <150)
VLDL: 17 mg/dL (ref 0–40)

## 2016-02-26 LAB — MAGNESIUM: Magnesium: 2.2 mg/dL (ref 1.7–2.4)

## 2016-02-26 LAB — HEPARIN LEVEL (UNFRACTIONATED): HEPARIN UNFRACTIONATED: 0.45 [IU]/mL (ref 0.30–0.70)

## 2016-02-26 LAB — TROPONIN I: TROPONIN I: 0.44 ng/mL — AB (ref <0.03)

## 2016-02-26 SURGERY — LEFT HEART CATH AND CORONARY ANGIOGRAPHY
Anesthesia: Moderate Sedation

## 2016-02-26 MED ORDER — FENTANYL CITRATE (PF) 100 MCG/2ML IJ SOLN
INTRAMUSCULAR | Status: DC | PRN
  Administered 2016-02-26: 50 ug via INTRAVENOUS
  Administered 2016-02-26 (×2): 25 ug via INTRAVENOUS

## 2016-02-26 MED ORDER — HEPARIN SODIUM (PORCINE) 1000 UNIT/ML IJ SOLN
INTRAMUSCULAR | Status: AC
  Filled 2016-02-26: qty 1

## 2016-02-26 MED ORDER — SODIUM CHLORIDE 0.9 % IV SOLN: 250.0000 mL | INTRAVENOUS | Status: DC | PRN

## 2016-02-26 MED ORDER — BIVALIRUDIN 250 MG IV SOLR: INTRAVENOUS | Status: DC | PRN

## 2016-02-26 MED ORDER — TIROFIBAN (AGGRASTAT) BOLUS VIA INFUSION
INTRAVENOUS | Status: DC | PRN
  Administered 2016-02-26: 2075 ug via INTRAVENOUS

## 2016-02-26 MED ORDER — ASPIRIN 81 MG PO CHEW
CHEWABLE_TABLET | ORAL | Status: AC
  Filled 2016-02-26: qty 4

## 2016-02-26 MED ORDER — TICAGRELOR 90 MG PO TABS
ORAL_TABLET | ORAL | Status: DC | PRN
  Administered 2016-02-26: 180 mg via ORAL

## 2016-02-26 MED ORDER — VERAPAMIL HCL 2.5 MG/ML IV SOLN
INTRAVENOUS | Status: AC
  Filled 2016-02-26: qty 2

## 2016-02-26 MED ORDER — TICAGRELOR 90 MG PO TABS
ORAL_TABLET | ORAL | Status: AC
  Filled 2016-02-26: qty 2

## 2016-02-26 MED ORDER — HEPARIN (PORCINE) IN NACL 2-0.9 UNIT/ML-% IJ SOLN
INTRAMUSCULAR | Status: AC
  Filled 2016-02-26: qty 500

## 2016-02-26 MED ORDER — NITROGLYCERIN 1 MG/10 ML FOR IR/CATH LAB
INTRA_ARTERIAL | Status: DC | PRN
  Administered 2016-02-26 (×2): 200 ug via INTRACORONARY
  Administered 2016-02-26: 300 ug via INTRACORONARY

## 2016-02-26 MED ORDER — NITROGLYCERIN 5 MG/ML IV SOLN
INTRAVENOUS | Status: AC
  Filled 2016-02-26: qty 10

## 2016-02-26 MED ORDER — SODIUM CHLORIDE 0.9 % IV SOLN
INTRAVENOUS | Status: AC
  Administered 2016-02-26 (×2): via INTRAVENOUS

## 2016-02-26 MED ORDER — ASPIRIN 81 MG PO CHEW
CHEWABLE_TABLET | ORAL | Status: DC | PRN
Start: 2016-02-26 — End: 2016-02-26
  Administered 2016-02-26: 324 mg via ORAL

## 2016-02-26 MED ORDER — TIROFIBAN HCL IN NACL 5-0.9 MG/100ML-% IV SOLN
INTRAVENOUS | Status: AC
  Filled 2016-02-26: qty 100

## 2016-02-26 MED ORDER — TIROFIBAN HCL IN NACL 5-0.9 MG/100ML-% IV SOLN
INTRAVENOUS | Status: DC | PRN
  Administered 2016-02-26: 0.15 ug/kg/min via INTRAVENOUS

## 2016-02-26 MED ORDER — TICAGRELOR 90 MG PO TABS
90.0000 mg | ORAL_TABLET | Freq: Two times a day (BID) | ORAL | Status: DC
  Administered 2016-02-26 – 2016-02-27 (×2): 90 mg via ORAL
  Filled 2016-02-26 (×2): qty 1

## 2016-02-26 MED ORDER — SODIUM CHLORIDE 0.9% FLUSH
3.0000 mL | Freq: Two times a day (BID) | INTRAVENOUS | Status: DC
  Administered 2016-02-26 (×2): 3 mL via INTRAVENOUS

## 2016-02-26 MED ORDER — ASPIRIN 81 MG PO CHEW: 81.0000 mg | CHEWABLE_TABLET | ORAL | Status: DC

## 2016-02-26 MED ORDER — SODIUM CHLORIDE 0.9% FLUSH: 3.0000 mL | INTRAVENOUS | Status: DC | PRN

## 2016-02-26 MED ORDER — BIVALIRUDIN BOLUS VIA INFUSION - CUPID: INTRAVENOUS | Status: DC | PRN

## 2016-02-26 MED ORDER — MIDAZOLAM HCL 2 MG/2ML IJ SOLN
INTRAMUSCULAR | Status: AC
  Filled 2016-02-26: qty 2

## 2016-02-26 MED ORDER — SODIUM CHLORIDE 0.9 % WEIGHT BASED INFUSION
3.0000 mL/kg/h | INTRAVENOUS | Status: DC
  Administered 2016-02-26: 3 mL/kg/h via INTRAVENOUS

## 2016-02-26 MED ORDER — SODIUM CHLORIDE 0.9 % WEIGHT BASED INFUSION: 1.0000 mL/kg/h | INTRAVENOUS | Status: DC

## 2016-02-26 MED ORDER — HEPARIN SODIUM (PORCINE) 1000 UNIT/ML IJ SOLN
INTRAMUSCULAR | Status: DC | PRN
Start: 2016-02-26 — End: 2016-02-26
  Administered 2016-02-26: 3000 [IU] via INTRAVENOUS
  Administered 2016-02-26 (×2): 4000 [IU] via INTRAVENOUS

## 2016-02-26 MED ORDER — MIDAZOLAM HCL 2 MG/2ML IJ SOLN
INTRAMUSCULAR | Status: DC | PRN
  Administered 2016-02-26 (×2): 1 mg via INTRAVENOUS

## 2016-02-26 MED ORDER — FENTANYL CITRATE (PF) 100 MCG/2ML IJ SOLN
INTRAMUSCULAR | Status: AC
  Filled 2016-02-26: qty 2

## 2016-02-26 MED ORDER — ALUM & MAG HYDROXIDE-SIMETH 200-200-20 MG/5ML PO SUSP
30.0000 mL | Freq: Once | ORAL | Status: AC
  Administered 2016-02-26: 30 mL via ORAL
  Filled 2016-02-26: qty 30

## 2016-02-26 MED ORDER — IOPAMIDOL (ISOVUE-300) INJECTION 61%
INTRAVENOUS | Status: DC | PRN
  Administered 2016-02-26: 205 mL via INTRA_ARTERIAL

## 2016-02-26 MED ORDER — SODIUM CHLORIDE 0.9% FLUSH: 3.0000 mL | Freq: Two times a day (BID) | INTRAVENOUS | Status: DC

## 2016-02-26 MED ORDER — TIROFIBAN HCL IN NACL 5-0.9 MG/100ML-% IV SOLN
0.1500 ug/kg/min | INTRAVENOUS | Status: AC
  Filled 2016-02-26: qty 100

## 2016-02-26 MED ORDER — VERAPAMIL HCL 2.5 MG/ML IV SOLN
INTRAVENOUS | Status: DC | PRN
  Administered 2016-02-26: 2.5 mg via INTRA_ARTERIAL

## 2016-02-26 SURGICAL SUPPLY — 19 items
BALLN TREK RX 2.5X12 (BALLOONS) ×3
BALLN TREK RX 2.5X15 (BALLOONS) ×3
BALLN ~~LOC~~ TREK RX 3.5X20 (BALLOONS) ×3
BALLOON TREK RX 2.5X12 (BALLOONS) IMPLANT
BALLOON TREK RX 2.5X15 (BALLOONS) IMPLANT
BALLOON ~~LOC~~ TREK RX 3.5X20 (BALLOONS) IMPLANT
CATH INFINITI 5FR ANG PIGTAIL (CATHETERS) ×2 IMPLANT
CATH OPTITORQUE JACKY 4.0 5F (CATHETERS) ×2 IMPLANT
CATH VISTA GUIDE 6FR JR4 (CATHETERS) ×2 IMPLANT
DEVICE INFLAT 30 PLUS (MISCELLANEOUS) ×2 IMPLANT
DEVICE RAD TR BAND REGULAR (VASCULAR PRODUCTS) ×2 IMPLANT
GLIDESHEATH SLEND SS 6F .021 (SHEATH) ×2 IMPLANT
KIT MANI 3VAL PERCEP (MISCELLANEOUS) ×3 IMPLANT
PACK CARDIAC CATH (CUSTOM PROCEDURE TRAY) ×3 IMPLANT
STENT XIENCE ALPINE RX 2.75X18 (Permanent Stent) ×2 IMPLANT
STENT XIENCE ALPINE RX 3.0X28 (Permanent Stent) ×2 IMPLANT
STENT XIENCE ALPINE RX 3.25X23 (Permanent Stent) ×2 IMPLANT
WIRE RUNTHROUGH .014X180CM (WIRE) ×2 IMPLANT
WIRE SAFE-T 1.5MM-J .035X260CM (WIRE) ×2 IMPLANT

## 2016-02-26 NOTE — Plan of Care (Signed)
Problem: Activity: Goal: Risk for activity intolerance will decrease Outcome: Progressing Independent in the room   Problem: Phase I Progression Outcomes Goal: Other Phase I Outcomes/Goals Outcome: Progressing heparin gtt infusing

## 2016-02-26 NOTE — Consult Note (Signed)
Cardiology Consultation Note  Patient ID: Jearld AdjutantSteve A [employer], MRN: [id number], DOB/AGE: Jan 20, 1951 65 y.o. Admit date: 02/25/2016   Date of Consult: 02/26/2016 Primary Physician: Tillman Abideichard Letvak, MD Primary Cardiologist: Dr. Jens Somrenshaw, MD Requesting Physician: Dr. Elpidio AnisSudini, MD  Chief Complaint: Chest pain Reason for Consult: NSTEMI  HPI: 65 y.o. male with h/o CAD (s/p BMS to LAD in 2007), HTN, HLD, GERD, and carotid artery stenosis (followed by Vascular surgery) who presents to the office today for evaluation of chest pain.   Previous ischemic evaluation includes his cardiac catheterization in 2007 which showed 95% stenosis along the proximal-LAD, 40% D1, and 75% RCA stenosis. PCI was performed with a Liberte bare metal stent. Nuclear stress testing in 08/2008 and 04/2012 was low-risk.  He was seen recently on 11/1 with complaints of chest pain as below with heavy exertion and [facility]. Unfortunately, his pain worsened and became present at rest as well prompting him to come to the ED.   Over the past 5-6 days, he has experienced a centralized chest pressure which radiates to his left pectoral region and neck. Initially this occurred only with heavy exertion; however, on 11/2 he began to develop chest pain at rest prompting him to come to the ED. When pain was with heavy exertional pain would improve with rest. He notes a diaphoresis, nausea without vomiting, SOB, palpitations, and dizziness. He initially thought it was heartburn but has taken antiacids with no relief. Says he has known GERD but his current symptoms do not seem to match with this, for his pain is usually relived with antiacids but this has not helped. Denies any exertional chest discomfort or dyspnea with exertion prior to this week. Says he has overall felt well since his catheterization in 2007 until now. His chest discomfort and abdominal pain is similar to what he experienced in 2007, as he thought he had  severe acid reflux for weeks, then later found out he had 95% stenosis.  Reports good compliance in his medications. Lipid Panel in 10/2015 showed total cholesterol 122, HDL, 35, and LDL 54. Quit smoking in 2015  [facility] they were found to have troponin 0.15-->0.27-->0.44, unremarkable CBC, SCr 1.16, K+ 4.1, TSH normal on 11/1. He was given full-dose ASA in the ED as well as SL NTG. ECG as below, CXR showed no acute pathology. He was started on a heparin gtt. He continues to have pain this morning, though improved.   Past Medical History:  Diagnosis Date  . BARRETTS ESOPHAGUS   . Carotid artery occlusion   . COLONIC POLYPS, HX OF   . COPD   . CORONARY ARTERY DISEASE    a. 2007: cath showing 95% stenosis proximal LAD (BMS placed), 40% D1, and 75% RCA stenosis. b. NST 08/2008: no evidence of ischemia c. NST 04/2012: no evidence of ischemia.  Marland Kitchen. GERD   . GLAUCOMA   . HYPERCHOLESTEROLEMIA   . HYPERLIPIDEMIA   . NSTEMI (non-ST elevated myocardial infarction) (HCC) 02/25/2016  . Other constipation   . OTITIS EXTERNA   . Peripheral vascular disease (HCC)       Most Recent Cardiac Studies: Cardiac Catheterization: 02/2006 FINDINGS: Aortic pressure 127/72 with a mean of 96; left ventricular pressure 130/4 with an end-diastolic pressure of 15.  Coronary angiography, left mainstem is angiographically normal. It bifurcates into the LAD and left circumflex. The LAD is a large diameter vessel throughout its proximal course. There is a long 95% stenosis with  ulcerated plaque present in the proximal vessel. It is a complex-appearing lesion. Beyond the lesion, the vessel is a large diameter vessel in the range of 3.5 mm. At the branching point of the first diagonal, the LAD changes in caliber. It is a medium caliber vessel beyond that point. There is a 40% stenosis of the branch point of the first diagonal and the mid LAD. There is also 30% stenosis  of the first diagonal vessel at its ostium. Both of these lesions appear nonobstructive. The remainder of the LAD is free of any significant angiographic disease.  The left circumflex gives off a first marginal branch very early in its course that supplies an intermediate territory. There is no significant angiographic disease in that vessel. Left circumflex is medium caliber and courses down the AV groove giving off a posterolateral branch. There is no significant angiographic disease throughout the left circumflex system.  The right coronary artery appears dominant. There is diffuse disease throughout. There is a 75% stenosis in the mid right coronary artery. The proximal vessel has 30% stenoses. The distal vessel has a 40% stenosis and a PDA branch has diffuse disease.  Left ventriculogram demonstrates normal left ventricular function with an estimated left ventricular ejection fraction of 55%.  ASSESSMENT: 1. Severe single-vessel coronary artery disease with high-grade proximal left anterior descending stenosis. 2. Moderate right coronary artery disease. 3. Normal left circumflex. 4. Normal LV function.  PLAN: As described above, PCI to the proximal LAD with a Liberte bare metal stent was performed. There was an excellent angiographic result with TIMI 3 flow following the procedure. The patient will require lifelong aspirin and 30 days of clopidogrel therapy. He will continue with aggressive risk factor modification with high-dose statin therapy which was just started as well as beta blocker therapy. If he has recurrent chest pain, it would be reasonable to perform a stress test and consider intervention on the right coronary artery. However, I suspect he will do well with medical therapy following treatment of his proximal LAD.   Surgical History:  Past Surgical History:  Procedure Laterality Date  . CORONARY STENT  PLACEMENT    . ESOPHAGOGASTRODUODENOSCOPY    . UMBILICAL HERNIA REPAIR       Home Meds: Prior to Admission medications   Medication Sig Start Date End Date Taking? Authorizing Provider  aspirin 325 MG tablet Take 325 mg by mouth daily.   Yes Historical Provider, MD  aspirin EC 81 MG tablet Take 1 tablet (81 mg total) by mouth daily. 02/24/16  Yes Ellsworth LennoxBrittany M Strader, PA  metoprolol tartrate (LOPRESSOR) 25 MG tablet TAKE 1 TABLET(25 MG) BY MOUTH TWICE DAILY 08/07/15  Yes Lewayne BuntingBrian S Crenshaw, MD  Multiple Vitamin (MULTIVITAMIN WITH MINERALS) TABS tablet Take 1 tablet by mouth daily.   Yes Historical Provider, MD  naproxen sodium (ANAPROX) 220 MG tablet Take 220-440 mg by mouth at bedtime as needed (leg cramps).   Yes Historical Provider, MD  nitroGLYCERIN (NITROSTAT) 0.4 MG SL tablet Place 1 tablet (0.4 mg total) under the tongue every 5 (five) minutes as needed for chest pain. 03/31/14  Yes Brittainy Sherlynn CarbonM Simmons, PA-C  pantoprazole (PROTONIX) 40 MG tablet TAKE 1 TABLET (40 MG TOTAL) BY MOUTH DAILY. Patient taking differently: Take 40 mg by mouth daily as needed (acid reflux).  07/02/15  Yes Lewayne BuntingBrian S Crenshaw, MD  rosuvastatin (CRESTOR) 40 MG tablet Take 1 tablet (40 mg total) by mouth daily. Patient taking differently: Take 40 mg by mouth at bedtime.  06/08/15  Yes Lewayne Bunting, MD    Inpatient Medications:  . [START ON 02/27/2016] aspirin  81 mg Oral Pre-Cath  . aspirin EC  81 mg Oral Daily  . metoprolol tartrate  25 mg Oral BID  . pantoprazole  40 mg Oral QHS  . rosuvastatin  40 mg Oral QHS  . sodium chloride flush  3 mL Intravenous Q12H  . sodium chloride flush  3 mL Intravenous Q12H  . sodium chloride flush  3 mL Intravenous Q12H   . [START ON 02/27/2016] sodium chloride     Followed by  . [START ON 02/27/2016] sodium chloride    . heparin 1,050 Units/hr (02/25/16 2032)    Allergies: No Known Allergies  Social History   Social History  . Marital status: Married    Spouse name: N/A    . Number of children: N/A  . Years of education: N/A   Occupational History  . Not on file.   Social History Main Topics  . Smoking status: Former Smoker    Packs/day: 0.10    Years: 47.00    Types: Cigarettes    Quit date: 09/24/2013  . Smokeless tobacco: Current User    Types: Chew  . Alcohol use 1.2 oz/week    2 Cans of beer per week     Comment: occ  . Drug use: No  . Sexual activity: Not on file   Other Topics Concern  . Not on file   Social History Narrative  . No narrative on file     Family History  Problem Relation Age of Onset  . Heart disease Father 86    Before age 33- Open Heart surgery  . Hyperlipidemia Mother      Review of Systems: Review of Systems  Constitutional: Positive for diaphoresis and malaise/fatigue. Negative for chills, fever and weight loss.  HENT: Negative for congestion.   Eyes: Negative for discharge and redness.  Respiratory: Positive for shortness of breath. Negative for cough, hemoptysis, sputum production and wheezing.   Cardiovascular: Positive for chest pain. Negative for palpitations, orthopnea, claudication, leg swelling and PND.  Gastrointestinal: Positive for nausea. Negative for abdominal pain, blood in stool, heartburn, melena and vomiting.  Genitourinary: Negative for hematuria.  Musculoskeletal: Negative for falls and myalgias.  Skin: Negative for rash.  Neurological: Positive for weakness and headaches. Negative for dizziness, tingling, tremors, sensory change, speech change, focal weakness and loss of consciousness.  Endo/Heme/Allergies: Does not bruise/bleed easily.  Psychiatric/Behavioral: Negative for substance abuse. The patient is not nervous/anxious.   All other systems reviewed and are negative.   Labs:  Recent Labs  02/25/16 1615 02/25/16 2050 02/26/16 0410  TROPONINI 0.15* 0.27* 0.44*   Lab Results  Component Value Date   WBC 9.6 02/25/2016   HGB 14.3 02/25/2016   HCT 41.4 02/25/2016   MCV 89.0  02/25/2016   PLT 216 02/25/2016     Recent Labs Lab 02/25/16 1615  NA 140  K 4.1  CL 106  CO2 26  BUN 14  CREATININE 1.16  CALCIUM 9.2  GLUCOSE 105*   Lab Results  Component Value Date   CHOL 122 11/03/2015   HDL 35 (L) 11/03/2015   LDLCALC 54 11/03/2015   TRIG 164 (H) 11/03/2015   No results found for: DDIMER  Radiology/Studies:  Dg Chest 2 View  Result Date: 02/25/2016 CLINICAL DATA:  Patient reports central CP x4-5 days. Denies SOB. Hx HTN, GERD, stent placement. Ex-smoker. EXAM: CHEST  2 VIEW COMPARISON:  06/21/2013 FINDINGS: Heart size is normal. There is mild prominence of interstitial markings. There are no focal consolidations or pleural effusions. No pulmonary edema. IMPRESSION: No evidence for acute cardiopulmonary abnormality. Electronically Signed   By: Norva Pavlov M.D.   On: 02/25/2016 16:44    EKG: Interpreted by me showed: NSR, 74 bpm, poor R wave progression, nonspecific lateral st/t changes Telemetry: Interpreted by me showed: NSR 70's bpm  Weights: Filed Weights   02/25/16 1613 02/25/16 1847 02/26/16 0443  Weight: 191 lb (86.6 kg) 183 lb 4.8 oz (83.1 kg) 183 lb 6.4 oz (83.2 kg)     Physical Exam: Blood pressure 130/75, pulse 65, temperature 97.7 F (36.5 C), temperature source Oral, resp. rate 18, height 5\' 11"  (1.803 m), weight 183 lb 6.4 oz (83.2 kg), SpO2 95 %. Body mass index is 25.58 kg/m. General: Well developed, well nourished, in no acute distress. Head: Normocephalic, atraumatic, sclera non-icteric, no xanthomas, nares are without discharge.  Neck: Negative for carotid bruits. JVD not elevated. Lungs: Clear bilaterally to auscultation without wheezes, rales, or rhonchi. Breathing is unlabored. Heart: RRR with S1 S2. No murmurs, rubs, or gallops appreciated. Abdomen: Soft, non-tender, non-distended with normoactive bowel sounds. No hepatomegaly. No rebound/guarding. No obvious abdominal masses. Msk:  Strength and tone appear normal for  age. Extremities: No clubbing or cyanosis. No edema. Distal pedal pulses are 2+ and equal bilaterally. Neuro: Alert and oriented X 3. No facial asymmetry. No focal deficit. Moves all extremities spontaneously. Psych:  Responds to questions appropriately with a normal affect.    Assessment and Plan:  Principal Problem:   NSTEMI (non-ST elevated myocardial infarction) (HCC) Active Problems:   Coronary atherosclerosis   Hyperlipidemia LDL goal <70   Hypertension   Carotid artery disease (HCC)   GERD    1. NSTEMI/CAD as above: -Currently with chest pain, though improved -Continue heparin gtt -Schedule for cardiac cath this morning with Dr. Kirke Corin at 9 AM -ASA -Metoprolol  -Crestor -SL NTG -Check echo -Check A1C and lipid panel -Risks and benefits of cardiac catheterization have been discussed with the patient including risks of bleeding, bruising, infection, kidney damage, stroke, heart attack, and death. The patient understands these risks and is willing to proceed with the procedure. All questions have been answered and concerns listened to -Cardiac rehab  2. HTN: -Well controlled -Continue current medications  3. HLD: -Crestor -Check lipid panel as above  4. History of tobacco abuse: -Quit in 2015  5. GERD: -Protonix  6. Carotid artery disease: -Outpatient follow up   Signed, Eula Listen, PA-C [facility] Pager: [phone] 02/26/2016, 7:54 AM

## 2016-02-26 NOTE — Progress Notes (Signed)
Pt remains clinically stable, tr band off with no visible active bleeding. Vss, alert and oriented. Wife at bedside, sr per monitor, stent card given to wife with questions answered..Marland Kitchen

## 2016-02-26 NOTE — Consult Note (Signed)
ANTICOAGULATION CONSULT NOTE - Initial Consult  Pharmacy Consult for heparin drip Indication: chest pain/ACS  No Known Allergies  Patient Measurements: Height: 5\' 11"  (180.3 cm) Weight: 183 lb 6.4 oz (83.2 kg) IBW/kg (Calculated) : 75.3 Heparin Dosing Weight: 86.6kg  Vital Signs: Temp: 97.7 F (36.5 C) (11/03 0443) Temp Source: Oral (11/03 0443) BP: 130/75 (11/03 0443) Pulse Rate: 65 (11/03 0443)  Labs:  Recent Labs  02/24/16 1507 02/25/16 1615 02/25/16 2050 02/26/16 0410  HGB 13.9 14.3  --   --   HCT 41.0 41.4  --   --   PLT 236 216  --   --   APTT  --   --  >160*  --   LABPROT 10.5  --  14.4  --   INR 1.0  --  1.11  --   HEPARINUNFRC  --   --   --  0.45  CREATININE 1.23 1.16  --   --   TROPONINI  --  0.15* 0.27* 0.44*    Estimated Creatinine Clearance: 68.5 mL/min (by C-G formula based on SCr of 1.16 mg/dL).   Medical History: Past Medical History:  Diagnosis Date  . BARRETTS ESOPHAGUS   . Carotid artery occlusion   . COLONIC POLYPS, HX OF   . COPD   . CORONARY ARTERY DISEASE    a. 2007: cath showing 95% stenosis proximal LAD (BMS placed), 40% D1, and 75% RCA stenosis. b. NST 08/2008: no evidence of ischemia c. NST 04/2012: no evidence of ischemia.  Marland Kitchen. GERD   . GLAUCOMA   . HYPERCHOLESTEROLEMIA   . HYPERLIPIDEMIA   . NSTEMI (non-ST elevated myocardial infarction) (HCC) 02/25/2016  . Other constipation   . OTITIS EXTERNA   . Peripheral vascular disease (HCC)     Medications:  Scheduled:  . aspirin EC  81 mg Oral Daily  . metoprolol tartrate  25 mg Oral BID  . pantoprazole  40 mg Oral QHS  . rosuvastatin  40 mg Oral QHS  . sodium chloride flush  3 mL Intravenous Q12H  . sodium chloride flush  3 mL Intravenous Q12H    Assessment: Pt is a 65 year old male who presents with chest pain. Pharmacy consulted to dose heparin for a NSTEMI. No home anticoagulation found. Baseline CBC WNL. Add on APTT and INR ordered.  Goal of Therapy:  Heparin level  0.3-0.7 units/ml Monitor platelets by anticoagulation protocol: Yes   Plan:  Give 4000 units bolus x 1 Start heparin infusion at 1050 units/hr Check anti-Xa level in 6 hours and daily while on heparin Continue to monitor H&H and platelets  11/3 04:00 heparin level 0.45. Continue current regimen and recheck in 6 hours to confirm.   Juliett Eastburn S 02/26/2016,5:36 AM

## 2016-02-26 NOTE — Care Management (Signed)
Patient confirms that he has Nurse, learning disabilitycommercial insurance with medication coverage.  States he has never been on Brilinta.  Provided patient with 30 day and continued copay assist coupon.

## 2016-02-26 NOTE — Progress Notes (Signed)
Patient had a 7 bt run of Vtach. Stated that he had some SOB but resolved with sitting up in bed. Dr. Clint GuyHower notified. No orders received. VSS will continue to monitor.

## 2016-02-26 NOTE — Progress Notes (Signed)
[facility] [place] [facility]   PATIENT NAME: [name] [name]    MRN#:  [id number]  DATE OF BIRTH:  1950-11-04  SUBJECTIVE:  Hospital Day: 1 day [name] [name] is a 65 y.o. male presenting with Chest Pain .   Overnight events: No overnight events Interval Events: Cardiac catheter with stent placement this morning patient evaluated in Cath Lab no acute complaints  REVIEW OF SYSTEMS:  CONSTITUTIONAL: No fever, fatigue or weakness.  EYES: No blurred or double vision.  EARS, NOSE, AND THROAT: No tinnitus or ear pain.  RESPIRATORY: No cough, shortness of breath, wheezing or hemoptysis.  CARDIOVASCULAR: No chest pain, orthopnea, edema.  GASTROINTESTINAL: No nausea, vomiting, diarrhea or abdominal pain.  GENITOURINARY: No dysuria, hematuria.  ENDOCRINE: No polyuria, nocturia,  HEMATOLOGY: No anemia, easy bruising or bleeding SKIN: No rash or lesion. MUSCULOSKELETAL: No joint pain or arthritis.   NEUROLOGIC: No tingling, numbness, weakness.  PSYCHIATRY: No anxiety or depression.   DRUG ALLERGIES:  No Known Allergies  VITALS:  Blood pressure (!) 148/75, pulse 63, temperature 97.7 F (36.5 C), temperature source Oral, resp. rate 13, height 5\' 11"  (1.803 m), weight 83 kg (183 lb), SpO2 96 %.  PHYSICAL EXAMINATION:  VITAL SIGNS: Vitals:   02/26/16 1200 02/26/16 1230  BP: 131/66 (!) 148/75  Pulse: 65 63  Resp: 12 13  Temp:     GENERAL:65 y.o.male currently in no acute distress.  HEAD: Normocephalic, atraumatic.  EYES: Pupils equal, round, reactive to light. Extraocular muscles intact. No scleral icterus.  MOUTH: Moist mucosal membrane. Dentition intact. No abscess noted.  EAR, NOSE, THROAT: Clear without exudates. No external lesions.  NECK: Supple. No thyromegaly. No nodules. No JVD.  PULMONARY: Clear to ascultation, without wheeze rails or rhonci. No use of accessory muscles, Good respiratory effort. good air entry bilaterally CHEST: Nontender to  palpation.  CARDIOVASCULAR: S1 and S2. Regular rate and rhythm. No murmurs, rubs, or gallops. No edema. Pedal pulses 2+ bilaterally.  GASTROINTESTINAL: Soft, nontender, nondistended. No masses. Positive bowel sounds. No hepatosplenomegaly.  MUSCULOSKELETAL: No swelling, clubbing, or edema. Range of motion full in all extremities.  NEUROLOGIC: Cranial nerves II through XII are intact. No gross focal neurological deficits. Sensation intact. Reflexes intact.  SKIN: No ulceration, lesions, rashes, or cyanosis. Skin warm and dry. Turgor intact.  PSYCHIATRIC: Mood, affect within normal limits. The patient is awake, alert and oriented x 3. Insight, judgment intact.      LABORATORY PANEL:   CBC  Recent Labs Lab 02/25/16 1615  WBC 9.6  HGB 14.3  HCT 41.4  PLT 216   ------------------------------------------------------------------------------------------------------------------  Chemistries   Recent Labs Lab 02/25/16 1615  NA 140  K 4.1  CL 106  CO2 26  GLUCOSE 105*  BUN 14  CREATININE 1.16  CALCIUM 9.2   ------------------------------------------------------------------------------------------------------------------  Cardiac Enzymes  Recent Labs Lab 02/26/16 0410  TROPONINI 0.44*   ------------------------------------------------------------------------------------------------------------------  RADIOLOGY:  Dg Chest 2 View  Result Date: 02/25/2016 CLINICAL DATA:  Patient reports central CP x4-5 days. Denies SOB. Hx HTN, GERD, stent placement. Ex-smoker. EXAM: CHEST  2 VIEW COMPARISON:  06/21/2013 FINDINGS: Heart size is normal. There is mild prominence of interstitial markings. There are no focal consolidations or pleural effusions. No pulmonary edema. IMPRESSION: No evidence for acute cardiopulmonary abnormality. Electronically Signed   By: Norva PavlovElizabeth  Brown M.D.   On: 02/25/2016 16:44    EKG:   Orders placed or performed during the hospital encounter of 02/25/16   . EKG 12-Lead  .  EKG 12-Lead  . ED EKG within 10 minutes  . ED EKG within 10 minutes  . EKG 12-Lead immediately post procedure  . EKG 12-Lead  . EKG 12-Lead immediately post procedure    ASSESSMENT AND PLAN:   [name] [name] is a 65 y.o. male presenting with Chest Pain . Admitted 02/25/2016 : Day #: 1 day 1. NSTEMI/coronary artery disease: Appreciate cardiology input received 3 drug-eluting stents proximal mid and distal RCA 2. Essential hypertension continue medications 3. Hyperlipidemia unspecified statin therapy GERD without esophagitis PPI therapy  All the records are reviewed and case discussed with Care Management/Social Workerr. Management plans discussed with the patient, family and they are in agreement.  CODE STATUS: full TOTAL TIME TAKING CARE OF THIS PATIENT: 28 minutes.   POSSIBLE D/C IN 1-2DAYS, DEPENDING ON CLINICAL CONDITION.   Arwa Yero,  Mardi MainlandDavid K M.D on 02/26/2016 at 12:33 PM  Between 7am to 6pm - Pager - [phone]  After 6pm: House Pager: - [phone]  [facility]  Office  [phone]  CC: Primary care physician; Tillman Abideichard Letvak, MD

## 2016-02-27 LAB — BASIC METABOLIC PANEL
ANION GAP: 3 — AB (ref 5–15)
BUN: 13 mg/dL (ref 6–20)
CHLORIDE: 109 mmol/L (ref 101–111)
CO2: 28 mmol/L (ref 22–32)
CREATININE: 1.04 mg/dL (ref 0.61–1.24)
Calcium: 9 mg/dL (ref 8.9–10.3)
GFR calc non Af Amer: >60 mL/min (ref >60)
Glucose, Bld: 108 mg/dL — ABNORMAL HIGH (ref 65–99)
POTASSIUM: 4.2 mmol/L (ref 3.5–5.1)
Sodium: 140 mmol/L (ref 135–145)

## 2016-02-27 LAB — HEMOGLOBIN A1C
HEMOGLOBIN A1C: 5.5 % (ref 4.8–5.6)
MEAN PLASMA GLUCOSE: 111 mg/dL

## 2016-02-27 MED ORDER — TICAGRELOR 90 MG PO TABS: 90.0000 mg | ORAL_TABLET | Freq: Two times a day (BID) | ORAL | 0 refills | Status: DC

## 2016-02-27 NOTE — Progress Notes (Signed)
   Subjective: No CP  Breathing is good   Objective: Vitals:   02/26/16 1430 02/26/16 1455 02/26/16 1943 02/27/16 0552  BP: (!) 147/78 (!) 141/70 132/61 (!) 119/57  Pulse: 74 64 64 73  Resp: (!) 21 14 18 18   Temp:  97.7 F (36.5 C) 97.8 F (36.6 C) 98.2 F (36.8 C)  TempSrc:  Oral Oral Oral  SpO2: 100% 99% 97% 97%  Weight:    181 lb 14.4 oz (82.5 kg)  Height:       Weight change: -8 lb (-3.629 kg)  Intake/Output Summary (Last 24 hours) at 02/27/16 1046 Last data filed at 02/27/16 1026  Gross per 24 hour  Intake              240 ml  Output              950 ml  Net             -710 ml    General: Alert, awake, oriented x3, in no acute distress Neck:  JVP is normal Heart: Regular rate and rhythm, without murmurs, rubs, gallops.  Lungs: Clear to auscultation.  No rales or wheezes. Exemities:  No edema.  R forearm with some bruising   No hematoma   Neuro: Grossly intact, nonfocal. Tele:  SR    Lab Results: Results for orders placed or performed during the hospital encounter of 02/25/16 (from the past 24 hour(s))  Basic metabolic panel     Status: Abnormal   Collection Time: 02/27/16  4:05 AM  Result Value Ref Range   Sodium 140 135 - 145 mmol/L   Potassium 4.2 3.5 - 5.1 mmol/L   Chloride 109 101 - 111 mmol/L   CO2 28 22 - 32 mmol/L   Glucose, Bld 108 (H) 65 - 99 mg/dL   BUN 13 6 - 20 mg/dL   Creatinine, Ser 1.611.04 0.61 - 1.24 mg/dL   Calcium 9.0 8.9 - 09.610.3 mg/dL   GFR calc non Af Amer >60 >60 mL/min   GFR calc Af Amer >60 >60 mL/min   Anion gap 3 (L) 5 - 15    Studies/Results: No results found.  Medications: REoverwed    @PROBHOSP @ 1  NSTEMI/CAD  2V CAD  Severe prox RCA dz (thrombotic)Vessel diffusely dz.  S/p PTCA/DES to prox/mid/ and distal RCA  Nonoverlapping   REcomm DAPT for at least 1 year   Aggressive risk factor modification    2  HTN  Fair control    3  HL  Crestor  4  CV dz    Will contact pt on Mon for appt      LOS: 2 days   Dietrich Patesaula  Ruhee Enck 02/27/2016, 10:46 AM

## 2016-02-27 NOTE — Discharge Summary (Signed)
[employer] - [place] [facility]   PATIENT NAME: [name] CroftsSteve [name]    MR#:  [id number]  DATE OF BIRTH:  [birth year]  DATE OF ADMISSION:  02/25/2016 ADMITTING PHYSICIAN: Milagros LollSrikar Sudini, MD  DATE OF DISCHARGE: 02/27/16  PRIMARY CARE PHYSICIAN: Tillman Abideichard Letvak, MD    ADMISSION DIAGNOSIS:  NSTEMI (non-ST elevated myocardial infarction) (HCC) [I21.4] Chest pain, unspecified type [R07.9]  DISCHARGE DIAGNOSIS:  Principal Problem:   NSTEMI (non-ST elevated myocardial infarction) (HCC) Active Problems:   Hyperlipidemia LDL goal <70   Coronary atherosclerosis   GERD   Hypertension   Carotid artery disease (HCC)   SECONDARY DIAGNOSIS:   Past Medical History:  Diagnosis Date  . BARRETTS ESOPHAGUS   . Carotid artery occlusion   . COLONIC POLYPS, HX OF   . COPD   . CORONARY ARTERY DISEASE    a. 2007: cath showing 95% stenosis proximal LAD (BMS placed), 40% D1, and 75% RCA stenosis. b. NST 08/2008: no evidence of ischemia c. NST 04/2012: no evidence of ischemia.  Marland Kitchen. GERD   . GLAUCOMA   . HYPERCHOLESTEROLEMIA   . HYPERLIPIDEMIA   . NSTEMI (non-ST elevated myocardial infarction) (HCC) 02/25/2016  . Other constipation   . OTITIS EXTERNA   . Peripheral vascular disease [facility](HCC)     HOSPITAL COURSE:  [name] CroftsSteve [name]  is a 65 y.o. male admitted 02/25/2016 with chief complaint Chest Pain . Please see H&P performed by Milagros LollSrikar Sudini, MD for further information. Patient presented with the above symptoms, found to have elevated troponin consistent with NSTEMI, started on heparin and underwent cardiac cath. PCI with DES placement x3 to RCA. Now chest pain free  DISCHARGE CONDITIONS:   stable  CONSULTS OBTAINED:  Treatment Team:  Iran OuchMuhammad A Arida, MD  DRUG ALLERGIES:  No Known Allergies  DISCHARGE MEDICATIONS:   Current Discharge Medication List    START taking these medications   Details  ticagrelor (BRILINTA) 90 MG TABS tablet Take 1 tablet (90 mg total) by mouth 2  (two) times daily. Qty: 60 tablet, Refills: 0      CONTINUE these medications which have NOT CHANGED   Details  aspirin EC 81 MG tablet Take 1 tablet (81 mg total) by mouth daily. Qty: 90 tablet, Refills: 3    metoprolol tartrate (LOPRESSOR) 25 MG tablet TAKE 1 TABLET(25 MG) BY MOUTH TWICE DAILY Qty: 180 tablet, Refills: 3    Multiple Vitamin (MULTIVITAMIN WITH MINERALS) TABS tablet Take 1 tablet by mouth daily.    naproxen sodium (ANAPROX) 220 MG tablet Take 220-440 mg by mouth at bedtime as needed (leg cramps).    nitroGLYCERIN (NITROSTAT) 0.4 MG SL tablet Place 1 tablet (0.4 mg total) under the tongue every 5 (five) minutes as needed for chest pain. Qty: 25 tablet, Refills: 2   Associated Diagnoses: Chest pain, unspecified chest pain type    pantoprazole (PROTONIX) 40 MG tablet TAKE 1 TABLET (40 MG TOTAL) BY MOUTH DAILY. Qty: 90 tablet, Refills: 3    rosuvastatin (CRESTOR) 40 MG tablet Take 1 tablet (40 mg total) by mouth daily. Qty: 90 tablet, Refills: 3   Associated Diagnoses: Coronary artery disease involving native coronary artery of native heart without angina pectoris      STOP taking these medications     aspirin 325 MG tablet          DISCHARGE INSTRUCTIONS:    DIET:  Cardiac diet  DISCHARGE CONDITION:  Stable  ACTIVITY:  Activity as tolerated  OXYGEN:  Home Oxygen:  No.   Oxygen Delivery: room air  DISCHARGE LOCATION:  home   If you experience worsening of your admission symptoms, develop shortness of breath, life threatening emergency, suicidal or homicidal thoughts you must seek medical attention immediately by calling 911 or calling your MD immediately  if symptoms less severe.  You Must read complete instructions/literature along with all the possible adverse reactions/side effects for all the Medicines you take and that have been prescribed to you. Take any new Medicines after you have completely understood and accpet all the possible adverse  reactions/side effects.   Please note  You were cared for by a hospitalist during your hospital stay. If you have any questions about your discharge medications or the care you received while you were in the hospital after you are discharged, you can call the unit and asked to speak with the hospitalist on call if the hospitalist that took care of you is not available. Once you are discharged, your primary care physician will handle any further medical issues. Please note that NO REFILLS for any discharge medications will be authorized once you are discharged, as it is imperative that you return to your primary care physician (or establish a relationship with a primary care physician if you do not have one) for your aftercare needs so that they can reassess your need for medications and monitor your lab values.    On the day of Discharge:   VITAL SIGNS:  Blood pressure (!) 119/57, pulse 73, temperature 98.2 F (36.8 C), temperature source Oral, resp. rate 18, height 5\' 11"  (1.803 m), weight 181 lb 14.4 oz (82.5 kg), SpO2 97 %.  I/O:   Intake/Output Summary (Last 24 hours) at 02/27/16 1045 Last data filed at 02/27/16 1026  Gross per 24 hour  Intake              240 ml  Output              950 ml  Net             -710 ml    PHYSICAL EXAMINATION:  GENERAL:  65 y.o.-year-old patient lying in the bed with no acute distress.  EYES: Pupils equal, round, reactive to light and accommodation. No scleral icterus. Extraocular muscles intact.  HEENT: Head atraumatic, normocephalic. Oropharynx and nasopharynx clear.  NECK:  Supple, no jugular venous distention. No thyroid enlargement, no tenderness.  LUNGS: Normal breath sounds bilaterally, no wheezing, rales,rhonchi or crepitation. No use of accessory muscles of respiration.  CARDIOVASCULAR: S1, S2 normal. No murmurs, rubs, or gallops.  ABDOMEN: Soft, non-tender, non-distended. Bowel sounds present. No organomegaly or mass.  EXTREMITIES: No pedal  edema, cyanosis, or clubbing.  NEUROLOGIC: Cranial nerves II through XII are intact. Muscle strength 5/5 in all extremities. Sensation intact. Gait not checked.  PSYCHIATRIC: The patient is alert and oriented x 3.  SKIN: No obvious rash, lesion, or ulcer.   DATA REVIEW:   CBC  Recent Labs Lab 02/25/16 1615  WBC 9.6  HGB 14.3  HCT 41.4  PLT 216    Chemistries   Recent Labs Lab 02/26/16 0410 02/27/16 0405  NA  --  140  K  --  4.2  CL  --  109  CO2  --  28  GLUCOSE  --  108*  BUN  --  13  CREATININE  --  1.04  CALCIUM  --  9.0  MG 2.2  --     Cardiac Enzymes  Recent Labs Lab  02/26/16 0410  TROPONINI 0.44*    Microbiology Results  No results found for this or any previous visit.  RADIOLOGY:  Dg Chest 2 View  Result Date: 02/25/2016 CLINICAL DATA:  Patient reports central CP x4-5 days. Denies SOB. Hx HTN, GERD, stent placement. Ex-smoker. EXAM: CHEST  2 VIEW COMPARISON:  06/21/2013 FINDINGS: Heart size is normal. There is mild prominence of interstitial markings. There are no focal consolidations or pleural effusions. No pulmonary edema. IMPRESSION: No evidence for acute cardiopulmonary abnormality. Electronically Signed   By: Norva Pavlov M.D.   On: 02/25/2016 16:44     Management plans discussed with the patient, family and they are in agreement.  CODE STATUS:     Code Status Orders        Start     Ordered   02/26/16 1110  Full code  Continuous     02/26/16 1109    Code Status History    Date Active Date Inactive Code Status Order ID Comments User Context   02/25/2016  5:40 PM 02/26/2016 11:09 AM Full Code [id number]  Milagros Loll, MD ED      TOTAL TIME TAKING CARE OF THIS PATIENT: 33 minutes.    Hower,  Mardi Mainland.D on 02/27/2016 at 10:45 AM  Between 7am to 6pm - Pager - [phone]  After 6pm go to [email or web address] - Social research officer, government  [employer] [facility]  Office  [phone]  CC: Primary care physician; Tillman Abide, MD

## 2016-02-27 NOTE — Discharge Instructions (Signed)
Coronary Angiogram With Stent Coronary angiography with stent placement is a procedure to widen or open a narrow blood vessel of the heart (coronary artery). When a coronary artery becomes partially blocked, it decreases blood flow to that area. This may lead to chest pain or a heart attack (myocardial infarction). Arteries may become blocked by cholesterol buildup (plaque) in the lining or wall.  A stent is a small piece of metal that looks like a mesh or a spring. Stent placement may be done right after a coronary angiography in which a blocked artery is found or as a treatment for a heart attack.  LET [facility] CARE PROVIDER KNOW ABOUT:  Any allergies you have.   All medicines you are taking, including vitamins, herbs, eye drops, creams, and over-the-counter medicines.   Previous problems you or members of your family have had with the use of anesthetics.   Any blood disorders you have.   Previous surgeries you have had.   Medical conditions you have. RISKS AND COMPLICATIONS Generally, coronary angiography with stent is a safe procedure. However, problems can occur and include:  Damage to the heart or its blood vessels.   A return of blockage.   Bleeding, infection, or bruising at the insertion site.   A collection of blood under the skin (hematoma) at the insertion site.  Blood clot in another part of the body.   Kidney injury.   Allergic reaction to the dye or contrast used.   Bleeding into the abdomen (retroperitoneal bleeding). BEFORE THE PROCEDURE  Do not eat or drink anything after midnight on the night before the procedure or as directed by your health care provider.  Ask your health care provider about changing or stopping your regular medicines. This is especially important if you are taking diabetes medicines or blood thinners.  Your health care provider will make sure you understand the procedure as well as the risks and potential problems  associated with the procedure.  PROCEDURE  You may be given a medicine to help you relax before and during the procedure (sedative). This medicine will be given through an IV tube that is put into one of your veins.   The area where the catheter will be inserted will be shaved and cleaned. This is usually done in the groin but may be done in the fold of your arm (near your elbow) or in the wrist.   A medicine will be given to numb the area where the catheter will be inserted (local anesthetic).   The catheter will be inserted into an artery using a guide wire. A type of X-ray (fluoroscopy) will be used to help guide the catheter to the opening of the blocked artery.   A dye will then be injected into the catheter, and X-rays will be taken. The dye will help to show where any narrowing or blockages are located in the heart arteries.   A tiny wire will be guided to the blocked spot, and a balloon will be inflated to make the artery wider. The stent will be expanded and will crush the plaque into the wall of the vessel. The stent will hold the area open like a scaffolding and improve the blood flow.   Sometimes the artery may be made wider using a laser or other tools to remove plaque.   When the blood flow is better, the catheter will be removed. The lining of the artery will grow over the stent, which stays where it was placed.  AFTER THE PROCEDURE  If the procedure is done through the leg, you will be kept in bed lying flat for about 6 hours. You will be instructed to not bend or cross your legs.   The insertion site will be checked frequently.   The pulse in your feet or wrist will be checked frequently.   Additional blood tests, X-rays, and electrocardiography may be done.   This information is not intended to replace advice given to you by your health care provider. Make sure you discuss any questions you have with your health care provider.   Document Released:  10/16/2002 Document Revised: 05/02/2014 Document Reviewed: 09/03/2012 Elsevier Interactive Patient Education 2016 Elsevier Inc. Coronary Angiogram With Stent, Care After Refer to this sheet in the next few weeks. These instructions provide you with information about caring for yourself after your procedure. Your health care provider may also give you more specific instructions. Your treatment has been planned according to current medical practices, but problems sometimes occur. Call your health care provider if you have any problems or questions after your procedure. WHAT TO EXPECT AFTER THE PROCEDURE  After your procedure, it is typical to have the following:  Bruising at the catheter insertion site that usually fades within 1-2 weeks.  Blood collecting in the tissue (hematoma) that may be painful to the touch. It should usually decrease in size and tenderness within 1-2 weeks. HOME CARE INSTRUCTIONS  Take medicines only as directed by your health care provider. Blood thinners may be prescribed after your procedure to improve blood flow through the stent.  You may shower 24-48 hours after the procedure or as directed by your health care provider. Remove the bandage (dressing) and gently wash the catheter insertion site with plain soap and water. Pat the area dry with a clean towel. Do not rub the site, because this may cause bleeding.  Do not take baths, swim, or use a hot tub until your health care provider approves.  Check your catheter insertion site every day for redness, swelling, or drainage.  Do not apply powder or lotion to the site.  Do not lift over 10 lb (4.5 kg) for 5 days after your procedure or as directed by your health care provider.  Ask your health care provider when it is okay to:  Return to work or school.  Resume usual physical activities or sports.  Resume sexual activity.  Eat a heart-healthy diet. This should include plenty of fresh fruits and vegetables.  Meat should be lean cuts. Avoid the following types of food:  Food that is high in salt.  Canned or highly processed food.  Food that is high in saturated fat or sugar.  Fried food.  Make any other lifestyle changes as recommended by your health care provider. These may include:  Not using any tobacco products, including cigarettes, chewing tobacco, or electronic cigarettes.If you need help quitting, ask your health care provider.  Managing your weight.  Getting regular exercise.  Managing your blood pressure.  Limiting your alcohol intake.  Managing other health problems, such as diabetes.  If you need an MRI after your heart stent has been placed, be sure to tell the health care provider who orders the MRI that you have a heart stent.  Keep all follow-up visits as directed by your health care provider. This is important. SEEK MEDICAL CARE IF:  You have a fever.  You have chills.  You have increased bleeding from the catheter insertion site. Hold pressure on  the site. SEEK IMMEDIATE MEDICAL CARE IF:  You develop chest pain or shortness of breath, feel faint, or pass out.  You have unusual pain at the catheter insertion site.  You have redness, warmth, or swelling at the catheter insertion site.  You have drainage (other than a small amount of blood on the dressing) from the catheter insertion site.  The catheter insertion site is bleeding, and the bleeding does not stop after 30 minutes of holding steady pressure on the site.  You develop bleeding from any other place, such as from your rectum. There may be bright red blood in your urine or stool, or it may appear as black, tarry stool.   This information is not intended to replace advice given to you by your health care provider. Make sure you discuss any questions you have with your health care provider.   Document Released: 10/29/2004 Document Revised: 05/02/2014 Document Reviewed: 09/03/2012 Elsevier  Interactive Patient Education [employer]2016 Elsevier Inc.

## 2016-02-27 NOTE — Progress Notes (Signed)
Patient discharged via wheelchair and private vehicle. IV removed and catheter intact. All discharge instructions given and patient verbalizes understanding. Tele removed and returned. No prescriptions given to patient No distress noted.   

## 2016-02-29 ENCOUNTER — Ambulatory Visit (HOSPITAL_COMMUNITY): Admission: RE | Admit: 2016-02-29 | Payer: BLUE CROSS/BLUE SHIELD | Source: Ambulatory Visit | Admitting: Cardiology

## 2016-02-29 ENCOUNTER — Encounter (HOSPITAL_COMMUNITY): Admission: RE | Payer: Self-pay | Source: Ambulatory Visit

## 2016-02-29 ENCOUNTER — Telehealth: Payer: Self-pay | Admitting: Cardiology

## 2016-02-29 SURGERY — LEFT HEART CATH AND CORONARY ANGIOGRAPHY
Anesthesia: LOCAL

## 2016-02-29 NOTE — Telephone Encounter (Signed)
New message  Pt wife call requesting to speak with RN. Pt wife call on the behalf of the pt to say how much he appreciates the fast action of his care. Pt wife states pt was able to get three stents placed. Please call back to discuss if needed.

## 2016-03-01 NOTE — Telephone Encounter (Signed)
Returned call. Patient and wife just wanted to thank Victorino DikeJennifer for advice given during a previous triage call. Noted she had given him recommendations on NTG & to go to ED if he was not improved. He ended up going to ED and had emergent cath over weekend. They also wanted to thank Randall AnBrittany Strader for her help in seeing the patient that day. This requires no follow up, but they did want to acknowledge their thanks, and noted that the care received in office & hospital was excellent.

## 2016-03-02 ENCOUNTER — Encounter: Payer: Self-pay | Admitting: Internal Medicine

## 2016-03-02 ENCOUNTER — Ambulatory Visit (INDEPENDENT_AMBULATORY_CARE_PROVIDER_SITE_OTHER): Payer: BLUE CROSS/BLUE SHIELD | Admitting: Internal Medicine

## 2016-03-02 DIAGNOSIS — I779 Disorder of arteries and arterioles, unspecified: Secondary | ICD-10-CM | POA: Diagnosis not present

## 2016-03-02 DIAGNOSIS — I214 Non-ST elevation (NSTEMI) myocardial infarction: Secondary | ICD-10-CM

## 2016-03-02 DIAGNOSIS — I739 Peripheral vascular disease, unspecified: Secondary | ICD-10-CM

## 2016-03-02 NOTE — Assessment & Plan Note (Signed)
Reviewed hospital records He must stop the chewing tobacco Symptoms resolved now Has cardiology follow up planned

## 2016-03-02 NOTE — Progress Notes (Signed)
Subjective:    Patient ID: [name] [name], male    DOB: March 18, 1951, 65 y.o.   MRN: [id number]  HPI Here for hospital follow up Reviewed hospital records  Had some increasing chest pain for about 3 weeks---but only if splitting wood, etc Seen in office and elective cath planned Then had rest pain Friend brought him to hospital--- non Q MI 3 stents placed  Feels good now Taking it easy for a few days Cath through right radial artery  No chest pain Did have SOB the day after the stent--- with walking. Then it went away No dizziness or syncope No palpitations  Current Outpatient Prescriptions on File Prior to Visit  Medication Sig Dispense Refill  . aspirin EC 81 MG tablet Take 1 tablet (81 mg total) by mouth daily. 90 tablet 3  . metoprolol tartrate (LOPRESSOR) 25 MG tablet TAKE 1 TABLET(25 MG) BY MOUTH TWICE DAILY 180 tablet 3  . Multiple Vitamin (MULTIVITAMIN WITH MINERALS) TABS tablet Take 1 tablet by mouth daily.    . naproxen sodium (ANAPROX) 220 MG tablet Take 220-440 mg by mouth at bedtime as needed (leg cramps).    . nitroGLYCERIN (NITROSTAT) 0.4 MG SL tablet Place 1 tablet (0.4 mg total) under the tongue every 5 (five) minutes as needed for chest pain. 25 tablet 2  . pantoprazole (PROTONIX) 40 MG tablet TAKE 1 TABLET (40 MG TOTAL) BY MOUTH DAILY. (Patient taking differently: Take 40 mg by mouth daily as needed (acid reflux). ) 90 tablet 3  . rosuvastatin (CRESTOR) 40 MG tablet Take 1 tablet (40 mg total) by mouth daily. (Patient taking differently: Take 40 mg by mouth at bedtime. ) 90 tablet 3  . ticagrelor (BRILINTA) 90 MG TABS tablet Take 1 tablet (90 mg total) by mouth 2 (two) times daily. 60 tablet 0   No current facility-administered medications on file prior to visit.     No Known Allergies  Past Medical History:  Diagnosis Date  . BARRETTS ESOPHAGUS   . Carotid artery occlusion   . COLONIC POLYPS, HX OF   . COPD   . CORONARY ARTERY DISEASE    a. 2007: cath  showing 95% stenosis proximal LAD (BMS placed), 40% D1, and 75% RCA stenosis. b. NST 08/2008: no evidence of ischemia c. NST 04/2012: no evidence of ischemia.  Marland Kitchen. GERD   . GLAUCOMA   . HYPERCHOLESTEROLEMIA   . HYPERLIPIDEMIA   . NSTEMI (non-ST elevated myocardial infarction) (HCC) 02/25/2016  . Other constipation   . OTITIS EXTERNA   . Peripheral vascular disease [facility](HCC)     Past Surgical History:  Procedure Laterality Date  . CARDIAC CATHETERIZATION N/A 02/26/2016   Procedure: Left Heart Cath and Coronary Angiography;  Surgeon: Iran OuchMuhammad A Arida, MD;  Location: [facility] INVASIVE CV LAB;  Service: Cardiovascular;  Laterality: N/A;  . CARDIAC CATHETERIZATION N/A 02/26/2016   Procedure: Coronary Stent Intervention;  Surgeon: Iran OuchMuhammad A Arida, MD;  Location: [facility] INVASIVE CV LAB;  Service: Cardiovascular;  Laterality: N/A;  . CORONARY STENT PLACEMENT    . ESOPHAGOGASTRODUODENOSCOPY    . UMBILICAL HERNIA REPAIR      Family History  Problem Relation Age of Onset  . Heart disease Father 8946    Before age 64- Open Heart surgery  . Hyperlipidemia Mother     Social History   Social History  . Marital status: Married    Spouse name: N/A  . Number of children: N/A  . Years of education: N/A   Occupational  History  . Not on file.   Social History Main Topics  . Smoking status: Former Smoker    Packs/day: 0.10    Years: 47.00    Types: Cigarettes    Quit date: 09/24/2013  . Smokeless tobacco: Current User    Types: Chew  . Alcohol use 1.2 oz/week    2 Cans of beer per week     Comment: occ  . Drug use: No  . Sexual activity: Not on file   Other Topics Concern  . Not on file   Social History Narrative  . No narrative on file   Review of Systems  Appetite is okay Sleeping well     Objective:   Physical Exam  Constitutional: He appears well-developed and well-nourished. No distress.  Neck: Normal range of motion. Neck supple. No thyromegaly present.  Cardiovascular: Normal  rate, regular rhythm, normal heart sounds and intact distal pulses.  Exam reveals no gallop.   No murmur heard. Pulmonary/Chest: Effort normal and breath sounds normal. No respiratory distress. He has no wheezes. He has no rales.  Musculoskeletal: He exhibits no edema.  Lymphadenopathy:    He has no cervical adenopathy.  Psychiatric: He has a normal mood and affect. His behavior is normal.          Assessment & Plan:

## 2016-03-02 NOTE — Assessment & Plan Note (Signed)
Keeps up with regular follow up

## 2016-03-02 NOTE — Patient Instructions (Addendum)
Please try over the counter miralax if your bowels are slow. You MUST stop the chewing tobacco. Call 1-800 QUIT NOW for help.

## 2016-03-02 NOTE — Progress Notes (Signed)
Pre visit review using our clinic review tool, if applicable. No additional management support is needed unless otherwise documented below in the visit note. 

## 2016-03-09 NOTE — Addendum Note (Signed)
Addended by: Melodye PedMANESS-HARRISON, CHANDA C on: 03/09/2016 04:05 PM   Modules accepted: Orders

## 2016-03-11 ENCOUNTER — Encounter: Payer: Self-pay | Admitting: Cardiology

## 2016-03-11 ENCOUNTER — Ambulatory Visit (INDEPENDENT_AMBULATORY_CARE_PROVIDER_SITE_OTHER): Payer: BLUE CROSS/BLUE SHIELD | Admitting: Cardiology

## 2016-03-11 ENCOUNTER — Encounter: Payer: BLUE CROSS/BLUE SHIELD | Admitting: Nurse Practitioner

## 2016-03-11 VITALS — BP 112/70 | HR 61 | Ht 71.0 in | Wt 188.0 lb

## 2016-03-11 DIAGNOSIS — I779 Disorder of arteries and arterioles, unspecified: Secondary | ICD-10-CM | POA: Diagnosis not present

## 2016-03-11 DIAGNOSIS — I214 Non-ST elevation (NSTEMI) myocardial infarction: Secondary | ICD-10-CM | POA: Diagnosis not present

## 2016-03-11 DIAGNOSIS — I10 Essential (primary) hypertension: Secondary | ICD-10-CM

## 2016-03-11 DIAGNOSIS — I739 Peripheral vascular disease, unspecified: Secondary | ICD-10-CM

## 2016-03-11 DIAGNOSIS — I2510 Atherosclerotic heart disease of native coronary artery without angina pectoris: Secondary | ICD-10-CM

## 2016-03-11 DIAGNOSIS — E785 Hyperlipidemia, unspecified: Secondary | ICD-10-CM

## 2016-03-11 DIAGNOSIS — Z9861 Coronary angioplasty status: Secondary | ICD-10-CM

## 2016-03-11 NOTE — Assessment & Plan Note (Signed)
Less than 40% by doppler July 2017

## 2016-03-11 NOTE — Assessment & Plan Note (Signed)
LAD PCI stent in '07 RCA PCI with DES x 3 on 02/26/16

## 2016-03-11 NOTE — Progress Notes (Signed)
03/11/2016 [name] [name]   [birth year]  [id number]  Primary Physician Tillman Abideichard Letvak, MD Primary Cardiologist: Dr Jens Somrenshaw  HPI:  65 y/o male followed by Dr Jens Somrenshaw with a istory of CAD, s/p LAD PCI in 2007. He had done well since. 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 He quit smoing a year ago. He was seen I the office 02/24/16 with complaints of chest pain c/w [place]. OP cath was planned for 02/29/16. The pt then presented [facility] 02/25/16 with chest pain and ruled in for a NSTEMI- Troponin pk 0.4. Cath done 02/26/16 showed diffuse thrombotic RCA disease. He underwent PCI with 3 DES placed to the RCA by Dr Kennith MaesArrida at [facility]. LVF was normal. He is in the office today for follow up. 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 He says he feels pretty good, no chest pain. His main complaint is pain in his hands.    Current Outpatient Prescriptions  Medication Sig Dispense Refill  . aspirin EC 81 MG tablet Take 1 tablet (81 mg total) by mouth daily. 90 tablet 3  . metoprolol tartrate (LOPRESSOR) 25 MG tablet TAKE 1 TABLET(25 MG) BY MOUTH TWICE DAILY 180 tablet 3  . Multiple Vitamin (MULTIVITAMIN WITH MINERALS) TABS tablet Take 1 tablet by mouth daily.    . naproxen sodium (ANAPROX) 220 MG tablet Take 220-440 mg by mouth at bedtime as needed (leg cramps).    . nitroGLYCERIN (NITROSTAT) 0.4 MG SL tablet Place 1 tablet (0.4 mg total) under the tongue every 5 (five) minutes as needed for chest pain. 25 tablet 2  . pantoprazole (PROTONIX) 40 MG tablet TAKE 1 TABLET (40 MG TOTAL) BY MOUTH DAILY. (Patient taking differently: Take 40 mg by mouth daily as needed (acid reflux). ) 90 tablet 3  . rosuvastatin (CRESTOR) 40 MG tablet Take 1 tablet (40 mg total) by mouth daily. (Patient taking differently: Take 40 mg by mouth at bedtime. ) 90 tablet 3  . ticagrelor (BRILINTA) 90 MG TABS tablet Take 1 tablet (90 mg total) by mouth 2 (two)  times daily. 60 tablet 0   No current facility-administered medications for this visit.     No Known Allergies  Social History   Social History  . Marital status: Married    Spouse name: N/A  . Number of children: N/A  . Years of education: N/A   Occupational History  . Not on file.   Social History Main Topics  . Smoking status: Former Smoker    Packs/day: 0.10    Years: 47.00    Types: Cigarettes    Quit date: 09/24/2013  . Smokeless tobacco: Current User    Types: Chew  . Alcohol use 1.2 oz/week    2 Cans of beer per week     Comment: occ  . Drug use: No  . Sexual activity: Not on file   Other Topics Concern  . Not on file   Social History Narrative  . No narrative on file     Review of Systems: General: negative for chills, fever, night sweats or weight changes.  Cardiovascular: negative for chest pain, dyspnea on exertion, edema, orthopnea, palpitations, paroxysmal nocturnal dyspnea or shortness of breath Dermatological: negative for rash Respiratory: negative for cough or wheezing Urologic: negative for hematuria Abdominal: negative for nausea, vomiting, diarrhea, bright red blood per rectum, melena, or hematemesis Neurologic: negative for  visual changes, syncope, or dizziness All other systems reviewed and are otherwise negative except as noted above.    Blood pressure 112/70, pulse 61, height 5\' 11"  (1.803 m), weight 188 lb (85.3 kg).  General appearance: alert, cooperative, appears older than stated age and no distress Neck: no carotid bruit and no JVD Lungs: clear to auscultation bilaterally Heart: regular rate and rhythm Extremities: extremities normal, atraumatic, no cyanosis or edema Skin: Skin color, texture, turgor normal. No rashes or lesions Neurologic: Grossly normal  EKG [id number]  ASSESSMENT AND PLAN:   NSTEMI (non-ST elevated myocardial infarction) (HCC) [facility]16 with a NSTEMI while awaiting an OP cath  Hyperlipidemia  LDL goal <70 On high dose statin but having some pain in his hands  CAD S/P percutaneous coronary angioplasty LAD PCI stent in '07 RCA PCI with DES x 3 on 02/26/16  Carotid artery disease (HCC) Less than 40% by doppler July 2017   PLAN  I suggested he try Crestor 20 mg for a week or two to see if his hands feel better, if not go back up to 40 mg daily. His LDL in the hospital was 43. He'll f/u with Dr Jens Somrenshaw in 3 months.   [name] ShelterLuke Jamye Balicki PA-C 03/11/2016 10:55 AM

## 2016-03-11 NOTE — Assessment & Plan Note (Addendum)
Present[facility] 02/25/16 with a NSTEMI while awaiting an OP cath

## 2016-03-11 NOTE — Assessment & Plan Note (Signed)
On high dose statin but having some pain in his hands

## 2016-03-11 NOTE — Patient Instructions (Signed)
Medication Instructions:  Continue current medications  Labwork: None Ordered  Testing/Procedures: None Ordered  Follow-Up: Your physician recommends that you schedule a follow-up appointment in: 3 Months with Dr Jens Somrenshaw   Any Other Special Instructions Will Be Listed Below (If Applicable).      Happy Thanksgiving   If you need a refill on your cardiac medications before your next appointment, please call your pharmacy.

## 2016-03-28 ENCOUNTER — Other Ambulatory Visit: Payer: Self-pay | Admitting: Cardiology

## 2016-03-28 MED ORDER — TICAGRELOR 90 MG PO TABS: 90.0000 mg | ORAL_TABLET | Freq: Two times a day (BID) | ORAL | 3 refills | Status: DC

## 2016-03-28 NOTE — Telephone Encounter (Signed)
°*  STAT* If patient is at the pharmacy, call can be transferred to refill team.   1. Which medications need to be refilled? (please list name of each medication and dose if known) Brilinta 90 mg   2. Which pharmacy/location (including street and city if local pharmacy) is medication to be sent to?[facility] on [address] in [place]   3. Do they need a 30 day or 90 day supply? 90

## 2016-03-30 ENCOUNTER — Ambulatory Visit (INDEPENDENT_AMBULATORY_CARE_PROVIDER_SITE_OTHER)
Admission: RE | Admit: 2016-03-30 | Discharge: 2016-03-30 | Disposition: A | Discharge status: Home / Self Care | Payer: BLUE CROSS/BLUE SHIELD | Source: Ambulatory Visit | Attending: Family Medicine | Admitting: Family Medicine

## 2016-03-30 ENCOUNTER — Encounter: Payer: Self-pay | Admitting: Family Medicine

## 2016-03-30 ENCOUNTER — Ambulatory Visit (INDEPENDENT_AMBULATORY_CARE_PROVIDER_SITE_OTHER): Payer: BLUE CROSS/BLUE SHIELD | Admitting: Family Medicine

## 2016-03-30 VITALS — BP 118/62 | HR 68 | Temp 98.4°F | Wt 188.0 lb

## 2016-03-30 DIAGNOSIS — M79644 Pain in right finger(s): Secondary | ICD-10-CM

## 2016-03-30 NOTE — Progress Notes (Signed)
Pre visit review using our clinic review tool, if applicable. No additional management support is needed unless otherwise documented below in the visit note. 

## 2016-03-30 NOTE — Progress Notes (Signed)
Dr. Karleen HampshireSpencer T. Addalynne Golding, MD, CAQ Sports Medicine Primary Care and Sports Medicine [address] [place], [address] Phone: [phone] Fax: [phone]  03/30/2016  Patient: [name] [name], MRN: [id number], DOB: December 22, 1950, 65 y.o.  Primary Physician:  Tillman Abideichard Letvak, MD   Chief Complaint  Patient presents with  . Hand Pain    both hands hurt, but mashed little finger on right hand 2 days ago   Subjective:   [name] AdjutantSteve A Gillian is a 65 y.o. very pleasant male patient who presents with the following:  R 5th finger sprain, hit with wood and on wood stove 2 days prior, now with swelling and pain in the 5th digit. Minimal in the others.   Past Medical History, Surgical History, Social History, Family History, Problem List, Medications, and Allergies have been reviewed and updated if relevant.  Patient Active Problem List   Diagnosis Date Noted  . NSTEMI (non-ST elevated myocardial infarction) (HCC) 02/25/2016  . Cellulitis of arm, right 09/09/2013  . Carotid artery disease (HCC) 03/14/2012  . Cerebrovascular disease 01/27/2011  . Hypertension 01/27/2011  . CAROTID BRUIT 01/26/2010  . TOBACCO ABUSE 08/27/2008  . Hyperlipidemia LDL goal <70 04/15/2007  . HYPERCHOLESTEROLEMIA 04/11/2007  . GLAUCOMA 04/11/2007  . CAD S/P percutaneous coronary angioplasty 04/11/2007  . COPD 04/11/2007  . GERD 04/11/2007  . BARRETTS ESOPHAGUS 04/11/2007  . COLONIC POLYPS, HX OF 04/11/2007    Past Medical History:  Diagnosis Date  . BARRETTS ESOPHAGUS   . Carotid artery occlusion   . COLONIC POLYPS, HX OF   . COPD   . CORONARY ARTERY DISEASE    a. 2007: cath showing 95% stenosis proximal LAD (BMS placed), 40% D1, and 75% RCA stenosis. b. NST 08/2008: no evidence of ischemia c. NST 04/2012: no evidence of ischemia.  Marland Kitchen. GERD   . GLAUCOMA   . HYPERCHOLESTEROLEMIA   . HYPERLIPIDEMIA   . NSTEMI (non-ST elevated myocardial infarction) (HCC) 02/25/2016  . Other constipation   . OTITIS EXTERNA     . Peripheral vascular disease [facility](HCC)     Past Surgical History:  Procedure Laterality Date  . CARDIAC CATHETERIZATION N/A 02/26/2016   Procedure: Left Heart Cath and Coronary Angiography;  Surgeon: Iran OuchMuhammad A Arida, MD;  Location: [facility] INVASIVE CV LAB;  Service: Cardiovascular;  Laterality: N/A;  . CARDIAC CATHETERIZATION N/A 02/26/2016   Procedure: Coronary Stent Intervention;  Surgeon: Iran OuchMuhammad A Arida, MD;  Location: [facility] INVASIVE CV LAB;  Service: Cardiovascular;  Laterality: N/A;  . CORONARY STENT PLACEMENT    . ESOPHAGOGASTRODUODENOSCOPY    . UMBILICAL HERNIA REPAIR      Social History   Social History  . Marital status: Married    Spouse name: N/A  . Number of children: N/A  . Years of education: N/A   Occupational History  . Not on file.   Social History Main Topics  . Smoking status: Former Smoker    Packs/day: 0.10    Years: 47.00    Types: Cigarettes    Quit date: 09/24/2013  . Smokeless tobacco: Current User    Types: Chew  . Alcohol use 1.2 oz/week    2 Cans of beer per week     Comment: occ  . Drug use: No  . Sexual activity: Not on file   Other Topics Concern  . Not on file   Social History Narrative  . No narrative on file    Family History  Problem Relation Age of Onset  . Heart disease  Father [id number]    Before age 64- Open Heart surgery  . Hyperlipidemia Mother     No Known Allergies  Medication list reviewed and updated in full in [facility].  GEN: No fevers, chills. Nontoxic. Primarily MSK c/o today. MSK: Detailed in the HPI GI: tolerating PO intake without difficulty Neuro: No numbness, parasthesias, or tingling associated. Otherwise the pertinent positives of the ROS are noted above.   Objective:   BP 118/62   Pulse 68   Temp 98.4 F (36.9 C) (Oral)   Wt 188 lb (85.3 kg)   BMI 26.22 kg/m    GEN: WDWN, NAD, Non-toxic, Alert & Oriented x 3 HEENT: Atraumatic, Normocephalic.  Ears and Nose: No external deformity. EXTR: No  clubbing/cyanosis/edema NEURO: Normal gait.  PSYCH: Normally interactive. Conversant. Not depressed or anxious appearing.  Calm demeanor.   R hand Ecchymosis or edema: neg ROM wrist/hand/digits: slight decrease flexion at 5th Carpals, MCP's, digits: mild ttp and swelling at 5th Distal Ulna and Radius: NT Ecchymosis or edema: neg No instability Cysts/nodules: neg Digit triggering: neg Finkelstein's test: neg Snuffbox tenderness: neg Scaphoid tubercle: NT Resisted supination: NT Full composite fist, no malrotation Grip, all digits: 5/5 - 4/5 including the 5th No tenosynovitis Axial load test: neg Phalen's: neg Tinel's: neg Atrophy: neg  Hand sensation: intact   Radiology: Xray, 3 Views Hand, AP, Lateral, and Oblique Indication: hand pain Hand: Findings: There is no evidence of acute fracture or dislocation  Electronically Signed  By: Hannah BeatSpencer Lavern Maslow, MD On: 03/30/2016 10:52 AM   Assessment and Plan:   Finger pain, right - Plan: DG Finger Little Right  Reassurance, buddy tape as needed only.  Follow-up: No Follow-up on file.  No orders of the defined types were placed in this encounter.  There are no discontinued medications. Orders Placed This Encounter  Procedures  . DG Finger Little Right    Signed,  Teriana Danker T. Dontrae Morini, MD     Medication List       Accurate as of 03/30/16 10:52 AM. Always use your most recent med list.          aspirin EC 81 MG tablet Take 1 tablet (81 mg total) by mouth daily.   metoprolol tartrate 25 MG tablet Commonly known as:  LOPRESSOR TAKE 1 TABLET(25 MG) BY MOUTH TWICE DAILY   multivitamin with minerals Tabs tablet Take 1 tablet by mouth daily.   naproxen sodium 220 MG tablet Commonly known as:  ANAPROX Take 220-440 mg by mouth at bedtime as needed (leg cramps).   nitroGLYCERIN 0.4 MG SL tablet Commonly known as:  NITROSTAT Place 1 tablet (0.4 mg total) under the tongue every 5 (five) minutes as needed for chest  pain.   pantoprazole 40 MG tablet Commonly known as:  PROTONIX TAKE 1 TABLET (40 MG TOTAL) BY MOUTH DAILY.   rosuvastatin 40 MG tablet Commonly known as:  CRESTOR Take 1 tablet (40 mg total) by mouth daily.   ticagrelor 90 MG Tabs tablet Commonly known as:  BRILINTA Take 1 tablet (90 mg total) by mouth 2 (two) times daily.

## 2016-06-04 ENCOUNTER — Other Ambulatory Visit: Payer: Self-pay | Admitting: Cardiology

## 2016-06-15 NOTE — Progress Notes (Deleted)
HPI: FU coronary disease. He underwent cardiac catheterization in 2007 and had a bare-metal stent to his LAD. He has residual right coronary artery disease. Patient had non-ST elevation myocardial infarction November 2017. He had PCI of his right coronary artery with 3 drug-eluting stents. LV function was normal. Also with cerebrovascular disease followed by vascular surgery. Since last seen,   Current Outpatient Prescriptions  Medication Sig Dispense Refill  . aspirin EC 81 MG tablet Take 1 tablet (81 mg total) by mouth daily. 90 tablet 3  . metoprolol tartrate (LOPRESSOR) 25 MG tablet TAKE 1 TABLET(25 MG) BY MOUTH TWICE DAILY 180 tablet 3  . Multiple Vitamin (MULTIVITAMIN WITH MINERALS) TABS tablet Take 1 tablet by mouth daily.    . naproxen sodium (ANAPROX) 220 MG tablet Take 220-440 mg by mouth at bedtime as needed (leg cramps).    . nitroGLYCERIN (NITROSTAT) 0.4 MG SL tablet Place 1 tablet (0.4 mg total) under the tongue every 5 (five) minutes as needed for chest pain. 25 tablet 2  . pantoprazole (PROTONIX) 40 MG tablet TAKE 1 TABLET BY MOUTH EVERY DAY 90 tablet 2  . rosuvastatin (CRESTOR) 40 MG tablet Take 1 tablet (40 mg total) by mouth daily. (Patient taking differently: Take 40 mg by mouth at bedtime. ) 90 tablet 3  . ticagrelor (BRILINTA) 90 MG TABS tablet Take 1 tablet (90 mg total) by mouth 2 (two) times daily. 180 tablet 3   No current facility-administered medications for this visit.      Past Medical History:  Diagnosis Date  . BARRETTS ESOPHAGUS   . Carotid artery occlusion   . COLONIC POLYPS, HX OF   . COPD   . CORONARY ARTERY DISEASE    a. 2007: cath showing 95% stenosis proximal LAD (BMS placed), 40% D1, and 75% RCA stenosis. b. NST 08/2008: no evidence of ischemia c. NST 04/2012: no evidence of ischemia.  Marland Kitchen. GERD   . GLAUCOMA   . HYPERCHOLESTEROLEMIA   . HYPERLIPIDEMIA   . NSTEMI (non-ST elevated myocardial infarction) (HCC) 02/25/2016  . Other constipation     . OTITIS EXTERNA   . Peripheral vascular disease [facility](HCC)     Past Surgical History:  Procedure Laterality Date  . CARDIAC CATHETERIZATION N/A 02/26/2016   Procedure: Left Heart Cath and Coronary Angiography;  Surgeon: Iran OuchMuhammad A Arida, MD;  Location: [facility] INVASIVE CV LAB;  Service: Cardiovascular;  Laterality: N/A;  . CARDIAC CATHETERIZATION N/A 02/26/2016   Procedure: Coronary Stent Intervention;  Surgeon: Iran OuchMuhammad A Arida, MD;  Location: [facility] INVASIVE CV LAB;  Service: Cardiovascular;  Laterality: N/A;  . CORONARY STENT PLACEMENT    . ESOPHAGOGASTRODUODENOSCOPY    . UMBILICAL HERNIA REPAIR      Social History   Social History  . Marital status: Married    Spouse name: N/A  . Number of children: N/A  . Years of education: N/A   Occupational History  . Not on file.   Social History Main Topics  . Smoking status: Former Smoker    Packs/day: 0.10    Years: 47.00    Types: Cigarettes    Quit date: 09/24/2013  . Smokeless tobacco: Current User    Types: Chew  . Alcohol use 1.2 oz/week    2 Cans of beer per week     Comment: occ  . Drug use: No  . Sexual activity: Not on file   Other Topics Concern  . Not on file   Social History Narrative  .  No narrative on file    Family History  Problem Relation Age of Onset  . Heart disease Father 33    Before age 14- Open Heart surgery  . Hyperlipidemia Mother     ROS: no fevers or chills, productive cough, hemoptysis, dysphasia, odynophagia, melena, hematochezia, dysuria, hematuria, rash, seizure activity, orthopnea, PND, pedal edema, claudication. Remaining systems are negative.  Physical Exam: Well-developed well-nourished in no acute distress.  Skin is warm and dry.  HEENT is normal.  Neck is supple.  Chest is clear to auscultation with normal expansion.  Cardiovascular exam is regular rate and rhythm.  Abdominal exam nontender or distended. No masses palpated. Extremities show no edema. neuro grossly  intact  ECG

## 2016-06-17 ENCOUNTER — Other Ambulatory Visit: Payer: Self-pay | Admitting: Cardiology

## 2016-06-17 DIAGNOSIS — I2510 Atherosclerotic heart disease of native coronary artery without angina pectoris: Secondary | ICD-10-CM

## 2016-06-20 ENCOUNTER — Ambulatory Visit: Payer: BLUE CROSS/BLUE SHIELD | Admitting: Cardiology

## 2016-08-01 NOTE — Progress Notes (Signed)
HPI: FU coronary disease. He underwent cardiac catheterization in 2007 and had a bare-metal stent to his LAD. Patient had repeat catheterization November 2017 and was found to have an 80% posterior lateral, significant RCA disease and ejection fraction 55-65%. Patient had 3 drug-eluting stents placed in the right coronary artery at that time. Also with cerebrovascular disease followed by vascular surgery. Since last seen, the patient denies any dyspnea on exertion, orthopnea, PND, pedal edema, palpitations, syncope or chest pain.   Current Outpatient Prescriptions  Medication Sig Dispense Refill  . aspirin EC 81 MG tablet Take 1 tablet (81 mg total) by mouth daily. 90 tablet 3  . metoprolol tartrate (LOPRESSOR) 25 MG tablet TAKE 1 TABLET(25 MG) BY MOUTH TWICE DAILY 180 tablet 3  . Multiple Vitamin (MULTIVITAMIN WITH MINERALS) TABS tablet Take 1 tablet by mouth daily.    . naproxen sodium (ANAPROX) 220 MG tablet Take 220-440 mg by mouth at bedtime as needed (leg cramps).    . nitroGLYCERIN (NITROSTAT) 0.4 MG SL tablet Place 1 tablet (0.4 mg total) under the tongue every 5 (five) minutes as needed for chest pain. 25 tablet 2  . pantoprazole (PROTONIX) 40 MG tablet TAKE 1 TABLET BY MOUTH EVERY DAY 90 tablet 2  . rosuvastatin (CRESTOR) 40 MG tablet TAKE 1 TABLET(40 MG) BY MOUTH DAILY 90 tablet 2  . ticagrelor (BRILINTA) 90 MG TABS tablet Take 1 tablet (90 mg total) by mouth 2 (two) times daily. 180 tablet 3   No current facility-administered medications for this visit.      Past Medical History:  Diagnosis Date  . BARRETTS ESOPHAGUS   . Carotid artery occlusion   . COLONIC POLYPS, HX OF   . COPD   . CORONARY ARTERY DISEASE    a. 2007: cath showing 95% stenosis proximal LAD (BMS placed), 40% D1, and 75% RCA stenosis. b. NST 08/2008: no evidence of ischemia c. NST 04/2012: no evidence of ischemia.  Marland Kitchen GERD   . GLAUCOMA   . HYPERCHOLESTEROLEMIA   . HYPERLIPIDEMIA   . NSTEMI (non-ST  elevated myocardial infarction) (HCC) 02/25/2016  . Other constipation   . OTITIS EXTERNA   . Peripheral vascular disease [facility])     Past Surgical History:  Procedure Laterality Date  . CARDIAC CATHETERIZATION N/A 02/26/2016   Procedure: Left Heart Cath and Coronary Angiography;  Surgeon: Iran Ouch, MD;  Location: [facility] INVASIVE CV LAB;  Service: Cardiovascular;  Laterality: N/A;  . CARDIAC CATHETERIZATION N/A 02/26/2016   Procedure: Coronary Stent Intervention;  Surgeon: Iran Ouch, MD;  Location: [facility] INVASIVE CV LAB;  Service: Cardiovascular;  Laterality: N/A;  . CORONARY STENT PLACEMENT    . ESOPHAGOGASTRODUODENOSCOPY    . UMBILICAL HERNIA REPAIR      Social History   Social History  . Marital status: Married    Spouse name: N/A  . Number of children: N/A  . Years of education: N/A   Occupational History  . Not on file.   Social History Main Topics  . Smoking status: Former Smoker    Packs/day: 0.10    Years: 47.00    Types: Cigarettes    Quit date: 09/24/2013  . Smokeless tobacco: Current User    Types: Chew  . Alcohol use 1.2 oz/week    2 Cans of beer per week     Comment: occ  . Drug use: No  . Sexual activity: Not on file   Other Topics Concern  . Not on file  Social History Narrative  . No narrative on file    Family History  Problem Relation Age of Onset  . Heart disease Father 5    Before age 4- Open Heart surgery  . Hyperlipidemia Mother     ROS: no fevers or chills, productive cough, hemoptysis, dysphasia, odynophagia, melena, hematochezia, dysuria, hematuria, rash, seizure activity, orthopnea, PND, pedal edema, claudication. Remaining systems are negative.  Physical Exam: Well-developed well-nourished in no acute distress.  Skin is warm and dry.  HEENT is normal.  Neck is supple. Left carotid bruit Chest is clear to auscultation with normal expansion.  Cardiovascular exam is regular rate and rhythm. No murmur Abdominal exam  nontender or distended. No masses palpated. Abdominal bruit Extremities show no edema. neuro grossly intact  ECG- Sinus rhythm with occasional PAC. Inferior infarct. personally reviewed  A/P  1 Coronary artery disease-continue aspirin, brilinta and statin. Resume metoprolol 25 mg BID.  2 hypertension-blood pressure controlled. Resume metoprolol.  3 hyperlipidemia-continue statin.  4 cerebrovascular disease-continue aspirin and statin. Followed by vascular surgery.  5 Bruit-Abdominal ultrasound to exclude aneurysm.  Olga Millers, MD

## 2016-08-15 ENCOUNTER — Encounter: Payer: Self-pay | Admitting: Cardiology

## 2016-08-15 ENCOUNTER — Ambulatory Visit (INDEPENDENT_AMBULATORY_CARE_PROVIDER_SITE_OTHER): Payer: PPO | Admitting: Cardiology

## 2016-08-15 VITALS — BP 120/68 | HR 79 | Ht 71.0 in | Wt 183.0 lb

## 2016-08-15 DIAGNOSIS — E7800 Pure hypercholesterolemia, unspecified: Secondary | ICD-10-CM | POA: Diagnosis not present

## 2016-08-15 DIAGNOSIS — I2510 Atherosclerotic heart disease of native coronary artery without angina pectoris: Secondary | ICD-10-CM

## 2016-08-15 DIAGNOSIS — R0989 Other specified symptoms and signs involving the circulatory and respiratory systems: Secondary | ICD-10-CM | POA: Diagnosis not present

## 2016-08-15 MED ORDER — METOPROLOL TARTRATE 25 MG PO TABS: ORAL_TABLET | ORAL | 3 refills | Status: DC

## 2016-08-15 NOTE — Patient Instructions (Signed)
Medication Instructions:   Refill sent to the pharmacy electronically.   Testing/Procedures:  Your physician has requested that you have an abdominal aorta duplex. During this test, an ultrasound is used to evaluate the aorta. Allow 30 minutes for this exam. Do not eat after midnight the day before and avoid carbonated beverages   Follow-Up:  Your physician wants you to follow-up in: 6 MONTHS WITH DR Jens Som You will receive a reminder letter in the mail two months in advance. If you don't receive a letter, please call our office to schedule the follow-up appointment.   If you need a refill on your cardiac medications before your next appointment, please call your pharmacy.

## 2016-08-31 ENCOUNTER — Encounter: Payer: BLUE CROSS/BLUE SHIELD | Admitting: Internal Medicine

## 2016-09-02 ENCOUNTER — Ambulatory Visit (HOSPITAL_COMMUNITY)
Admission: RE | Admit: 2016-09-02 | Discharge: 2016-09-02 | Disposition: A | Discharge status: Home / Self Care | Payer: PPO | Source: Ambulatory Visit | Attending: Cardiovascular Disease | Admitting: Cardiovascular Disease

## 2016-09-02 DIAGNOSIS — I708 Atherosclerosis of other arteries: Secondary | ICD-10-CM | POA: Diagnosis not present

## 2016-09-02 DIAGNOSIS — R0989 Other specified symptoms and signs involving the circulatory and respiratory systems: Secondary | ICD-10-CM | POA: Diagnosis not present

## 2016-09-02 DIAGNOSIS — I700 Atherosclerosis of aorta: Secondary | ICD-10-CM | POA: Diagnosis not present

## 2016-10-17 ENCOUNTER — Encounter: Payer: Self-pay | Admitting: Internal Medicine

## 2016-10-17 ENCOUNTER — Ambulatory Visit (INDEPENDENT_AMBULATORY_CARE_PROVIDER_SITE_OTHER)
Admission: RE | Admit: 2016-10-17 | Discharge: 2016-10-17 | Disposition: A | Discharge status: Home / Self Care | Payer: PPO | Source: Ambulatory Visit | Attending: Internal Medicine | Admitting: Internal Medicine

## 2016-10-17 ENCOUNTER — Ambulatory Visit (INDEPENDENT_AMBULATORY_CARE_PROVIDER_SITE_OTHER): Payer: PPO | Admitting: Internal Medicine

## 2016-10-17 VITALS — BP 106/70 | HR 70 | Temp 98.1°F | Ht 69.0 in | Wt 181.0 lb

## 2016-10-17 DIAGNOSIS — Z23 Encounter for immunization: Secondary | ICD-10-CM | POA: Diagnosis not present

## 2016-10-17 DIAGNOSIS — I779 Disorder of arteries and arterioles, unspecified: Secondary | ICD-10-CM

## 2016-10-17 DIAGNOSIS — I739 Peripheral vascular disease, unspecified: Secondary | ICD-10-CM

## 2016-10-17 DIAGNOSIS — J449 Chronic obstructive pulmonary disease, unspecified: Secondary | ICD-10-CM

## 2016-10-17 DIAGNOSIS — K219 Gastro-esophageal reflux disease without esophagitis: Secondary | ICD-10-CM | POA: Diagnosis not present

## 2016-10-17 DIAGNOSIS — R042 Hemoptysis: Secondary | ICD-10-CM

## 2016-10-17 DIAGNOSIS — Z125 Encounter for screening for malignant neoplasm of prostate: Secondary | ICD-10-CM

## 2016-10-17 DIAGNOSIS — I2510 Atherosclerotic heart disease of native coronary artery without angina pectoris: Secondary | ICD-10-CM

## 2016-10-17 DIAGNOSIS — Z0000 Encounter for general adult medical examination without abnormal findings: Secondary | ICD-10-CM | POA: Diagnosis not present

## 2016-10-17 DIAGNOSIS — Z9861 Coronary angioplasty status: Secondary | ICD-10-CM | POA: Diagnosis not present

## 2016-10-17 NOTE — Patient Instructions (Signed)
Let me know if you cough up any more blood. You need your colonoscopy--please set this up when [facility].

## 2016-10-17 NOTE — Progress Notes (Signed)
Subjective:    Patient ID: [name] [name], male    DOB: 1950-09-24, 66 y.o.   MRN: [id number]  HPI Here for Welcome to Medicare visit and follow up of chronic health conditions Reviewed form and advanced directives Reviewed other doctors---on form Stopped smoking--- still dips some. Counseled about cessation-he is not ready Usually has 1-2 beers most days Vision is good Hearing is okay. Threshold is 20 db at 500, 1000 and 2000Hz  in both ears. Doesn't hear the 4000Hz  in either ear. Discussed hearing protection No falls No depression or anhedonia Independent with instrumental ADLs No sig memory problems  Trying to remodel son's house Still has auto body business Not too much exercise because of this  No chest pain Will occasionally have sense of needing deep breath--just at rest No palpitations No dizziness or syncope  Some cough at times Did have blood in mucus once this weekend--- right after awakening No problems since Some regular cough No wheezing  No headaches No diplopia  No focal weakness, aphasia or dysphagia   Current Outpatient Prescriptions on File Prior to Visit  Medication Sig Dispense Refill  . aspirin EC 81 MG tablet Take 1 tablet (81 mg total) by mouth daily. 90 tablet 3  . metoprolol tartrate (LOPRESSOR) 25 MG tablet TAKE 1 TABLET(25 MG) BY MOUTH TWICE DAILY 180 tablet 3  . Multiple Vitamin (MULTIVITAMIN WITH MINERALS) TABS tablet Take 1 tablet by mouth daily.    . nitroGLYCERIN (NITROSTAT) 0.4 MG SL tablet Place 1 tablet (0.4 mg total) under the tongue every 5 (five) minutes as needed for chest pain. 25 tablet 2  . pantoprazole (PROTONIX) 40 MG tablet TAKE 1 TABLET BY MOUTH EVERY DAY 90 tablet 2  . rosuvastatin (CRESTOR) 40 MG tablet TAKE 1 TABLET(40 MG) BY MOUTH DAILY 90 tablet 2  . ticagrelor (BRILINTA) 90 MG TABS tablet Take 1 tablet (90 mg total) by mouth 2 (two) times daily. 180 tablet 3   No current facility-administered medications on file prior  to visit.     No Known Allergies  Past Medical History:  Diagnosis Date  . BARRETTS ESOPHAGUS   . Carotid artery occlusion   . COLONIC POLYPS, HX OF   . COPD   . CORONARY ARTERY DISEASE    a. 2007: cath showing 95% stenosis proximal LAD (BMS placed), 40% D1, and 75% RCA stenosis. b. NST 08/2008: no evidence of ischemia c. NST 04/2012: no evidence of ischemia.  Marland Kitchen GERD   . GLAUCOMA   . HYPERCHOLESTEROLEMIA   . HYPERLIPIDEMIA   . NSTEMI (non-ST elevated myocardial infarction) (HCC) 02/25/2016  . Other constipation   . OTITIS EXTERNA   . Peripheral vascular disease [facility])     Past Surgical History:  Procedure Laterality Date  . CARDIAC CATHETERIZATION N/A 02/26/2016   Procedure: Left Heart Cath and Coronary Angiography;  Surgeon: Iran Ouch, MD;  Location: [facility] INVASIVE CV LAB;  Service: Cardiovascular;  Laterality: N/A;  . CARDIAC CATHETERIZATION N/A 02/26/2016   Procedure: Coronary Stent Intervention;  Surgeon: Iran Ouch, MD;  Location: [facility] INVASIVE CV LAB;  Service: Cardiovascular;  Laterality: N/A;  . CORONARY STENT PLACEMENT    . ESOPHAGOGASTRODUODENOSCOPY    . UMBILICAL HERNIA REPAIR      Family History  Problem Relation Age of Onset  . Heart disease Father 71       Before age 46- Open Heart surgery  . Hyperlipidemia Mother     Social History   Social History  .  Marital status: Married    Spouse name: N/A  . Number of children: N/A  . Years of education: N/A   Occupational History  . Not on file.   Social History Main Topics  . Smoking status: Former Smoker    Packs/day: 0.10    Years: 47.00    Types: Cigarettes    Quit date: 09/24/2013  . Smokeless tobacco: Current User    Types: Chew  . Alcohol use 1.2 oz/week    2 Cans of beer per week     Comment: occ  . Drug use: No  . Sexual activity: Not on file   Other Topics Concern  . Not on file   Social History Narrative   No living will    Would want wife, then son, would be health care POA    Would accept resuscitation attempts--but no prolonged artificial ventilation   Not sure about tube feeds   Review of Systems Appetite is good Weight stable Sleeps fine Wears seat belt Bowels are fine No heartburn on the protonix Voids okay No rash or suspicious lesions No back or sig joint pain--with daily tylenol    Objective:   Physical Exam  Constitutional: He is oriented to person, place, and time. He appears well-nourished. No distress.  HENT:  Nose: Nose normal.  Mouth/Throat: Oropharynx is clear and moist. No oropharyngeal exudate.  Neck: No thyromegaly present.  Cardiovascular: Normal rate, regular rhythm, normal heart sounds and intact distal pulses.  Exam reveals no gallop.   No murmur heard. Pulmonary/Chest: Effort normal. No respiratory distress. He has no wheezes. He has no rales.  Decreased breath sounds but clear  Abdominal: Soft. There is no tenderness.  Musculoskeletal: He exhibits no edema or tenderness.  Lymphadenopathy:    He has no cervical adenopathy.  Neurological: He is alert and oriented to person, place, and time.  President---- "Alinda Sierrasrump, Obama,Bush" [phone] Recall 3/3  9th grade only  Skin: No rash noted. No erythema.  Psychiatric: He has a normal mood and affect. His behavior is normal.          Assessment & Plan:

## 2016-10-17 NOTE — Addendum Note (Signed)
Addended by: Eual FinesBRIDGES, Elif Yonts P on: 10/17/2016 03:36 PM   Modules accepted: Orders

## 2016-10-17 NOTE — Assessment & Plan Note (Signed)
Will check CXR If recurs, CT and consider bronch

## 2016-10-17 NOTE — Assessment & Plan Note (Signed)
~  50% blockage bilaterally On asa, statin

## 2016-10-17 NOTE — Assessment & Plan Note (Signed)
Stopped smoking but dips now Mild symptoms but still some DOE No Rx for now

## 2016-10-17 NOTE — Assessment & Plan Note (Signed)
Quiet on the PPI 

## 2016-10-17 NOTE — Assessment & Plan Note (Signed)
No symptoms now Keeps up with cardiology 

## 2016-10-17 NOTE — Assessment & Plan Note (Signed)
I have personally reviewed the Medicare Annual Wellness questionnaire and have noted 1. The patient's medical and social history 2. Their use of alcohol, tobacco or illicit drugs 3. Their current medications and supplements 4. The patient's functional ability including ADL's, fall risks, home safety risks and hearing or visual             impairment. 5. Diet and physical activities 6. Evidence for depression or mood disorders  The patients weight, height, BMI and visual acuity have been recorded in the chart I have made referrals, counseling and provided education to the patient based review of the above and I have provided the pt with a written personalized care plan for preventive services.  I have provided you with a copy of your personalized plan for preventive services. Please take the time to review along with your updated medication list.  Overdue for colonoscopy--asked Dr Marina GoodellPerry to get him scheduled (talked to patient) Discussed PSA--he requests Will give prevnar Yearly flu vaccine Just had aortic ultrasound Discussed tobacco cessation

## 2016-10-17 NOTE — Addendum Note (Signed)
Addended by: Alvina ChouWALSH, Khalifa Knecht J on: 10/17/2016 03:45 PM   Modules accepted: Orders

## 2016-10-18 ENCOUNTER — Encounter: Payer: Self-pay | Admitting: Internal Medicine

## 2016-10-18 ENCOUNTER — Telehealth: Payer: Self-pay | Admitting: Internal Medicine

## 2016-10-18 DIAGNOSIS — I700 Atherosclerosis of aorta: Secondary | ICD-10-CM | POA: Insufficient documentation

## 2016-10-18 LAB — CBC WITH DIFFERENTIAL/PLATELET
BASOS PCT: 0.8 % (ref 0.0–3.0)
Basophils Absolute: 0.1 10*3/uL (ref 0.0–0.1)
EOS PCT: 3.8 % (ref 0.0–5.0)
Eosinophils Absolute: 0.4 10*3/uL (ref 0.0–0.7)
HCT: 41 % (ref 39.0–52.0)
Hemoglobin: 13.9 g/dL (ref 13.0–17.0)
LYMPHS ABS: 2.2 10*3/uL (ref 0.7–4.0)
Lymphocytes Relative: 21.2 % (ref 12.0–46.0)
MCHC: 34 g/dL (ref 30.0–36.0)
MCV: 90.9 fl (ref 78.0–100.0)
MONO ABS: 1 10*3/uL (ref 0.1–1.0)
Monocytes Relative: 9.5 % (ref 3.0–12.0)
NEUTROS ABS: 6.6 10*3/uL (ref 1.4–7.7)
NEUTROS PCT: 64.7 % (ref 43.0–77.0)
PLATELETS: 253 10*3/uL (ref 150.0–400.0)
RBC: 4.51 Mil/uL (ref 4.22–5.81)
RDW: 13.6 % (ref 11.5–15.5)
WBC: 10.3 10*3/uL (ref 4.0–10.5)

## 2016-10-18 LAB — COMPREHENSIVE METABOLIC PANEL
ALK PHOS: 62 U/L (ref 39–117)
ALT: 18 U/L (ref 0–53)
AST: 28 U/L (ref 0–37)
Albumin: 4.3 g/dL (ref 3.5–5.2)
BUN: 20 mg/dL (ref 6–23)
CO2: 30 mEq/L (ref 19–32)
Calcium: 9.7 mg/dL (ref 8.4–10.5)
Chloride: 104 mEq/L (ref 96–112)
Creatinine, Ser: 1.16 mg/dL (ref 0.40–1.50)
GFR: 67.11 mL/min (ref >60.00)
GLUCOSE: 89 mg/dL (ref 70–99)
POTASSIUM: 5 meq/L (ref 3.5–5.1)
SODIUM: 140 meq/L (ref 135–145)
TOTAL PROTEIN: 6.7 g/dL (ref 6.0–8.3)
Total Bilirubin: 0.7 mg/dL (ref 0.2–1.2)

## 2016-10-18 LAB — PSA: PSA: 1.12 ng/mL (ref 0.10–4.00)

## 2016-10-18 LAB — LIPID PANEL
CHOLESTEROL: 109 mg/dL (ref 0–200)
HDL: 36.4 mg/dL — ABNORMAL LOW (ref >39.00)
LDL Cholesterol: 39 mg/dL (ref 0–99)
NonHDL: 72.11
Total CHOL/HDL Ratio: 3
Triglycerides: 166 mg/dL — ABNORMAL HIGH (ref 0.0–149.0)
VLDL: 33.2 mg/dL (ref 0.0–40.0)

## 2016-10-18 NOTE — Telephone Encounter (Signed)
Patient returned Shannon's call. °

## 2016-10-18 NOTE — Telephone Encounter (Signed)
See x-ray report 

## 2016-10-19 ENCOUNTER — Telehealth: Payer: Self-pay

## 2016-10-19 NOTE — Telephone Encounter (Signed)
-----   Message from Hilarie FredricksonJohn N Perry, MD sent at 10/17/2016  3:35 PM EDT ----- Thank you Rich.   Bonita QuinLinda, This patient is overdue for surveillance colonoscopy. He has received several recall letters in the past as indicated in Epic, but apparently did not responded. Apparently, he is now willing to proceed at Dr. Karle StarchLetvak's encouragement. I do notice that he is on Brilinta. As such, Please schedule him to see an APP in the office for assessment and scheduling of his surveillance colonoscopy. Thank you. Dr. Marina GoodellPerry  ----- Message ----- From: Karie SchwalbeLetvak, Richard I, MD Sent: 10/17/2016   3:10 PM To: Hilarie FredricksonJohn N Perry, MD  Jonny RuizJohn, I am sure you called him back for repeat colon, but he hasn't had it. Can you have your staff contact him again about this (phone call) to prompt an appt. I told him it was important.  Rich

## 2016-10-19 NOTE — Telephone Encounter (Signed)
Pt scheduled to see Willette ClusterPaula Guenther NP 11/02/16@2 :30pm. Pt aware of appt.

## 2016-11-02 ENCOUNTER — Ambulatory Visit: Payer: PPO | Admitting: Nurse Practitioner

## 2016-11-11 ENCOUNTER — Encounter: Payer: Self-pay | Admitting: Family

## 2016-11-14 DIAGNOSIS — H2513 Age-related nuclear cataract, bilateral: Secondary | ICD-10-CM | POA: Diagnosis not present

## 2016-11-18 ENCOUNTER — Encounter: Payer: Self-pay | Admitting: Family

## 2016-11-18 ENCOUNTER — Ambulatory Visit (INDEPENDENT_AMBULATORY_CARE_PROVIDER_SITE_OTHER): Payer: PPO | Admitting: Family

## 2016-11-18 ENCOUNTER — Ambulatory Visit (HOSPITAL_COMMUNITY)
Admission: RE | Admit: 2016-11-18 | Discharge: 2016-11-18 | Disposition: A | Discharge status: Home / Self Care | Payer: PPO | Source: Ambulatory Visit | Attending: Vascular Surgery | Admitting: Vascular Surgery

## 2016-11-18 VITALS — BP 111/68 | HR 65 | Temp 97.5°F | Resp 16 | Ht 69.0 in | Wt 183.0 lb

## 2016-11-18 DIAGNOSIS — Z720 Tobacco use: Secondary | ICD-10-CM

## 2016-11-18 DIAGNOSIS — I6523 Occlusion and stenosis of bilateral carotid arteries: Secondary | ICD-10-CM

## 2016-11-18 DIAGNOSIS — Z87891 Personal history of nicotine dependence: Secondary | ICD-10-CM | POA: Diagnosis not present

## 2016-11-18 DIAGNOSIS — I6529 Occlusion and stenosis of unspecified carotid artery: Secondary | ICD-10-CM

## 2016-11-18 LAB — VAS US CAROTID
LCCADDIAS: 23 cm/s
LCCADSYS: 86 cm/s
LCCAPDIAS: 30 cm/s
LEFT ECA DIAS: -24 cm/s
LEFT VERTEBRAL DIAS: 13 cm/s
LICADSYS: -58 cm/s
LICAPDIAS: -27 cm/s
Left CCA prox sys: 117 cm/s
Left ICA dist dias: -22 cm/s
Left ICA prox sys: -124 cm/s
RCCADSYS: -93 cm/s
RCCAPSYS: 112 cm/s
RIGHT CCA MID DIAS: 27 cm/s
RIGHT ECA DIAS: 24 cm/s
Right CCA prox dias: 29 cm/s

## 2016-11-18 NOTE — Progress Notes (Signed)
Chief Complaint: Follow up Extracranial Carotid Artery Stenosis   History of Present Illness  [name] [name] is a 66 y.o. male patient that Dr. Edilia Boickson has been following for carotid artery stenosis.  Patient has not had previous carotid artery intervention.  He denies any history of TIA or stroke symptoms.specifically he denies a history of amaurosis fugax or monocular blindness, unilateral facial drooping, hemiplegia, or receptive or expressive aphasia.   He denies claudication symptoms with walking, denies history of MI, but had a cardiac stent placed.  Pt Diabetic: No Pt smoker: formersmoker (quit about May 2015). "I chew a little tobacco"  Pt meds include: Statin :yes ASA: Yes Other anticoagulants/antiplatelets: no    Past Medical History:  Diagnosis Date  . BARRETTS ESOPHAGUS   . Carotid artery occlusion   . COLONIC POLYPS, HX OF   . COPD   . CORONARY ARTERY DISEASE    a. 2007: cath showing 95% stenosis proximal LAD (BMS placed), 40% D1, and 75% RCA stenosis. b. NST 08/2008: no evidence of ischemia c. NST 04/2012: no evidence of ischemia.  Marland Kitchen. GERD   . GLAUCOMA   . HYPERCHOLESTEROLEMIA   . HYPERLIPIDEMIA   . NSTEMI (non-ST elevated myocardial infarction) (HCC) 02/25/2016  . Other constipation   . OTITIS EXTERNA   . Peripheral vascular disease (HCC)   . Thoracic aorta atherosclerosis [facility](HCC)     Social History Social History  Substance Use Topics  . Smoking status: Former Smoker    Packs/day: 0.10    Years: 47.00    Types: Cigarettes    Quit date: 09/24/2013  . Smokeless tobacco: Current User    Types: Chew  . Alcohol use 1.2 oz/week    2 Cans of beer per week     Comment: occ    Family History Family History  Problem Relation Age of Onset  . Heart disease Father 1546       Before age 65- Open Heart surgery  . Hyperlipidemia Mother     Surgical History Past Surgical History:  Procedure Laterality Date  . CARDIAC CATHETERIZATION N/A 02/26/2016    Procedure: Left Heart Cath and Coronary Angiography;  Surgeon: Iran OuchMuhammad A Arida, MD;  Location: [facility] INVASIVE CV LAB;  Service: Cardiovascular;  Laterality: N/A;  . CARDIAC CATHETERIZATION N/A 02/26/2016   Procedure: Coronary Stent Intervention;  Surgeon: Iran OuchMuhammad A Arida, MD;  Location: [facility] INVASIVE CV LAB;  Service: Cardiovascular;  Laterality: N/A;  . CORONARY STENT PLACEMENT    . ESOPHAGOGASTRODUODENOSCOPY    . UMBILICAL HERNIA REPAIR      No Known Allergies  Current Outpatient Prescriptions  Medication Sig Dispense Refill  . aspirin EC 81 MG tablet Take 1 tablet (81 mg total) by mouth daily. 90 tablet 3  . metoprolol tartrate (LOPRESSOR) 25 MG tablet TAKE 1 TABLET(25 MG) BY MOUTH TWICE DAILY 180 tablet 3  . Multiple Vitamin (MULTIVITAMIN WITH MINERALS) TABS tablet Take 1 tablet by mouth daily.    . nitroGLYCERIN (NITROSTAT) 0.4 MG SL tablet Place 1 tablet (0.4 mg total) under the tongue every 5 (five) minutes as needed for chest pain. 25 tablet 2  . pantoprazole (PROTONIX) 40 MG tablet TAKE 1 TABLET BY MOUTH EVERY DAY 90 tablet 2  . rosuvastatin (CRESTOR) 40 MG tablet TAKE 1 TABLET(40 MG) BY MOUTH DAILY 90 tablet 2  . ticagrelor (BRILINTA) 90 MG TABS tablet Take 1 tablet (90 mg total) by mouth 2 (two) times daily. 180 tablet 3   No current facility-administered medications for  this visit.     Review of Systems : See HPI for pertinent positives and negatives.  Physical Examination  Vitals:   11/18/16 1437 11/18/16 1438  BP: 113/69 111/68  Pulse: 65   Resp: 16   Temp: (!) 97.5 F (36.4 C)   TempSrc: Oral   SpO2: 98%   Weight: 183 lb (83 kg)   Height: 5\' 9"  (1.753 m)    Body mass index is 27.02 kg/m.  General: WDWN male in NAD GAIT:normal Eyes: PERRLA Pulmonary: Respirations are non-labored, CTAB Cardiac: regular rhythm and rate, no detected murmur.  VASCULAR EXAM Carotid Bruits Left Right   Positive Negative   Abdominal aortic pusle is not  palpable Radial pulses are 2+ palpable and equal.      LE Pulses LEFT RIGHT   POPLITEAL not palpable  not palpable   POSTERIOR TIBIAL  palpable   palpable    DORSALIS PEDIS  ANTERIOR TIBIAL palpable  palpable     Gastrointestinal: soft, nontender, BS WNL, no r/g, no palpated masses.  Musculoskeletal: No muscle atrophy/wasting. M/S 5/5 throughout, Extremities without ischemic changes.  Neurologic: A&O X 3; appropriate affect, speech is normal, CN 2-12 intact, pain and light touch intact in extremities, motor exam as listed above.    Assessment: [name] [name] is a 66 y.o. male who has no history of stroke or TIA.  Fortunately he does not have DM and he quit smoking in May of 2015. He admits to using using smokeless tobacco and I counseled him against the use of this. I gave him several free resources re tobacco cessation.  DATA Carotid Duplex (11/18/16): Bilateral internal carotid artery velocities suggest a <40% stenosis.  >50% stenosis of the left distal common carotid artery, flush with the bifurcation. No significant change in comparison to the last exams on 10/01/2014 and 11/18/15.   Abdominal aorta duplex in May 2018 for abdominal bruit showed normal caliber abdominal aorta and iliac arteries.    Plan: Follow-up in 1 year with Carotid Duplex scan.    I discussed in depth with the patient the nature of atherosclerosis, and emphasized the importance of maximal medical management including strict control of blood pressure, blood glucose, and lipid levels, obtaining regular exercise, and cessation of smokeless tobacco use.  The patient is aware that without maximal medical management the underlying atherosclerotic disease process will progress, limiting the benefit of any interventions. The  patient was given information about stroke prevention and what symptoms should prompt the patient to seek immediate medical care. Thank you for allowing us to participate in this patient's care.  Charisse MarchSuzanne Nickel, RN, MSN, FNP-C [facility] [place] Office: [phone]  Clinic Physician: Randie HeinzCain  11/18/16 2:53 PM

## 2016-11-18 NOTE — Patient Instructions (Signed)
Stroke Prevention Some medical conditions and behaviors are associated with an increased chance of having a stroke. You may prevent a stroke by making healthy choices and managing medical conditions. How can I reduce my risk of having a stroke?  Stay physically active. Get at least 30 minutes of activity on most or all days.  Do not smoke. It may also be helpful to avoid exposure to secondhand smoke.  Limit alcohol use. Moderate alcohol use is considered to be: ? No more than 2 drinks per day for men. ? No more than 1 drink per day for nonpregnant women.  Eat healthy foods. This involves: ? Eating 5 or more servings of fruits and vegetables a day. ? Making dietary changes that address high blood pressure (hypertension), high cholesterol, diabetes, or obesity.  Manage your cholesterol levels. ? Making food choices that are high in fiber and low in saturated fat, trans fat, and cholesterol may control cholesterol levels. ? Take any prescribed medicines to control cholesterol as directed by your health care provider.  Manage your diabetes. ? Controlling your carbohydrate and sugar intake is recommended to manage diabetes. ? Take any prescribed medicines to control diabetes as directed by your health care provider.  Control your hypertension. ? Making food choices that are low in salt (sodium), saturated fat, trans fat, and cholesterol is recommended to manage hypertension. ? Ask your health care provider if you need treatment to lower your blood pressure. Take any prescribed medicines to control hypertension as directed by your health care provider. ? If you are 18-39 years of age, have your blood pressure checked every 3-5 years. If you are 40 years of age or older, have your blood pressure checked every year.  Maintain a healthy weight. ? Reducing calorie intake and making food choices that are low in sodium, saturated fat, trans fat, and cholesterol are recommended to manage  weight.  Stop drug abuse.  Avoid taking birth control pills. ? Talk to your health care provider about the risks of taking birth control pills if you are over 35 years old, smoke, get migraines, or have ever had a blood clot.  Get evaluated for sleep disorders (sleep apnea). ? Talk to your health care provider about getting a sleep evaluation if you snore a lot or have excessive sleepiness.  Take medicines only as directed by your health care provider. ? For some people, aspirin or blood thinners (anticoagulants) are helpful in reducing the risk of forming abnormal blood clots that can lead to stroke. If you have the irregular heart rhythm of atrial fibrillation, you should be on a blood thinner unless there is a good reason you cannot take them. ? Understand all your medicine instructions.  Make sure that other conditions (such as anemia or atherosclerosis) are addressed. Get help right away if:  You have sudden weakness or numbness of the face, arm, or leg, especially on one side of the body.  Your face or eyelid droops to one side.  You have sudden confusion.  You have trouble speaking (aphasia) or understanding.  You have sudden trouble seeing in one or both eyes.  You have sudden trouble walking.  You have dizziness.  You have a loss of balance or coordination.  You have a sudden, severe headache with no known cause.  You have new chest pain or an irregular heartbeat. Any of these symptoms may represent a serious problem that is an emergency. Do not wait to see if the symptoms will go away.   Get medical help at once. Call your local emergency services (911 in [place] Do not drive yourself to the hospital. This information is not intended to replace advice given to you by your health care provider. Make sure you discuss any questions you have with your health care provider. Document Released: 05/19/2004 Document Revised: 09/17/2015 Document Reviewed: 10/12/2012 Elsevier  Interactive Patient Education  2017 Elsevier Inc.     Preventing Cerebrovascular Disease Arteries are blood vessels that carry blood that contains oxygen from the heart to all parts of the body. Cerebrovascular disease affects arteries that supply the brain. Any condition that blocks or disrupts blood flow to the brain can cause cerebrovascular disease. Brain cells that lose blood supply start to die within minutes (stroke). Stroke is the main danger of cerebrovascular disease. Atherosclerosis and high blood pressure are common causes of cerebrovascular disease. Atherosclerosis is narrowing and hardening of an artery that results when fat, cholesterol, calcium, or other substances (plaque) build up inside an artery. Plaque reduces blood flow through the artery. High blood pressure increases the risk of bleeding inside the brain. Making diet and lifestyle changes to prevent atherosclerosis and high blood pressure lowers your risk of cerebrovascular disease. What nutrition changes can be made?  Eat more fruits, vegetables, and whole grains.  Reduce how much saturated fat you eat. To do this, eat less red meat and fewer full-fat dairy products.  Eat healthy proteins instead of red meat. Healthy proteins include: ? Fish. Eat fish that contains heart-healthy omega-3 fatty acids, twice a week. Examples include salmon, albacore tuna, mackerel, and herring. ? Chicken. ? Nuts. ? Low-fat or nonfat yogurt.  Avoid processed meats, like bacon and lunchmeat.  Avoid foods that contain: ? A lot of sugar, such as sweets and drinks with added sugar. ? A lot of salt (sodium). Avoid adding extra salt to your food, as told by your health care provider. ? Trans fats, such as margarine and baked goods. Trans fats may be listed as "partially hydrogenated oils" on food labels.  Check food labels to see how much sodium, sugar, and trans fats are in foods.  Use vegetable oils that contain low amounts of  saturated fat, such as olive oil or canola oil. What lifestyle changes can be made?  Drink alcohol in moderation. This means no more than 1 drink a day for nonpregnant women and 2 drinks a day for men. One drink equals 12 oz of beer, 5 oz of wine, or 1 oz of hard liquor.  If you are overweight, ask your health care provider to recommend a weight-loss plan for you. Losing 5-10 lb (2.2-4.5 kg) can reduce your risk of diabetes, atherosclerosis, and high blood pressure.  Exercise for 30?60 minutes on most days, or as much as told by your health care provider. ? Do moderate-intensity exercise, such as brisk walking, bicycling, and water aerobics. Ask your health care provider which activities are safe for you.  Do not use any products that contain nicotine or tobacco, such as cigarettes and e-cigarettes. If you need help quitting, ask your health care provider. Why are these changes important? Making these changes lowers your risk of many diseases that can cause cerebrovascular disease and stroke. Stroke is a leading cause of death and disability. Making these changes also improves your overall health and quality of life. What can I do to lower my risk? The following factors make you more likely to develop cerebrovascular disease:  Being overweight.  Smoking.  Being physically inactive.    Eating a high-fat diet.  Having certain health conditions, such as: ? Diabetes. ? High blood pressure. ? Heart disease. ? Atherosclerosis. ? High cholesterol. ? Sickle cell disease.  Talk with your health care provider about your risk for cerebrovascular disease. Work with your health care provider to control diseases that you have that may contribute to cerebrovascular disease. Your health care provider may prescribe medicines to help prevent major causes of cerebrovascular disease. Where to find more information: Learn more about preventing cerebrovascular disease from:  National Heart, Lung, and  Blood Institute: ClassGossip.plwww.nhlbi.nih.gov/health/health-topics/topics/stroke  Centers for Disease Control and Prevention: http://www.gonzalez-chase.net/cdc.gov/stroke/about.htm  Summary  Cerebrovascular disease can lead to a stroke.  Atherosclerosis and high blood pressure are major causes of cerebrovascular disease.  Making diet and lifestyle changes can reduce your risk of cerebrovascular disease.  Work with your health care provider to get your risk factors under control to reduce your risk of cerebrovascular disease. This information is not intended to replace advice given to you by your health care provider. Make sure you discuss any questions you have with your health care provider. Document Released: 04/26/2015 Document Revised: 10/30/2015 Document Reviewed: 04/26/2015 Elsevier Interactive Patient Education  2018 [employer]     What You Need to Know About Smokeless Tobacco Use Tobacco use is one of the leading causes of cancer and other chronic health problems. Smokeless tobacco is tobacco that is put directly into the mouth instead of being smoked. It may also be called chewing tobacco or snuff. Smokeless tobacco is made from the leaves of tobacco plants and it comes in several forms:  Loose, dry leaves, plugs, or twists.  Moist pouches.  Dissolving lozenges or strips.  Chewing, sucking, or holding the tobacco in your mouth causes your mouth to make more saliva. The saliva mixes with the tobacco to make "tobacco juice" that is swallowed or spit out. How can smokeless tobacco affect me? Using smokeless tobacco:  Increases your risk of developing cancer. Smokeless tobacco contains at least 28 different types of cancer-causing chemicals (carcinogens).  Increases your chances of developing other long-term health problems, including high blood pressure, heart disease, stroke, and dental problems.  Can make you become addicted. Nicotine is one of the chemicals in tobacco. When you chew tobacco, you absorb  nicotine from the tobacco juice. This can make you feel more alert than usual.  Can cause problems with pregnancy. Pregnant women who use smokeless tobacco are more likely to miscarry or deliver a baby too early (premature delivery).  Can affect the appearance and health of your mouth. Using smokeless tobacco may cause bad breath, yellow-brown teeth, mouth sores, cracking and bleeding lips, gum recession, and lesions on the soft tissues of your mouth (leukoplakia).  What are the benefits of not using smokeless tobacco? The benefits of not using smokeless tobacco include:  A healthy mind because: ? You avoid addiction.  A healthy body because: ? You avoid dental problems. ? You promote healthy pregnancy. ? You avoid long-term health problems.  A healthy wallet because: ? You avoid costs of buying tobacco. ? You avoid health care costs in the future.  A healthy family because: ? You avoid accidental poisoning of children in your household.  What can happen if I continue to use smokeless tobacco? If you continue to use smokeless tobacco, you will increase your risk for developing certain cancers. These include:  Tongue.  Lips, mouth, and gums.  Throat (esophagus) and voice box (larynx).  Stomach.  Pancreas.  Bladder.  Colon.  Long-term use of smokeless tobacco can also lead to:  High blood pressure, heart disease, and stroke.  Gum disease, gum recession, and bone loss around the teeth.  Tooth decay.  How do I quit using smokeless tobacco? Quitting the use of smokeless tobacco can be hard, but it can be done. Follow these steps:  Pick a date to quit. Set a date within the next two weeks. This gives you time to prepare.  Write down the reasons why you are quitting. Keep this list in places where you will see it often, such as on your bathroom mirror or in your car or wallet.  Identify the people, places, things, and activities that make you want to use tobacco  (triggers) and avoid them.  Get rid of any tobacco you have and remove any tobacco smells. To do this: ? Throw away all containers of tobacco at home, at work, and in your car. ? Throw away any other items that you use regularly when you chew tobacco. ? Clean your car and make sure to remove all tobacco-related items. ? Clean your home, including curtains and carpets.  Tell your family, friends, and coworkers that you are quitting. This can make quitting easier.  Ask your health care provider for help quitting smokeless tobacco. This may involve treatment. Find out what treatment options are covered by your health insurance.  Keep track of how many days have passed since you quit. Remembering how long and hard you have worked to quit can help you avoid using tobacco again.  Where can I get support? Ask your health care provider if there is a local support group for quitting smokeless tobacco. Where can I get more information? You can learn more about the risks of using smokeless tobacco and the benefits of quitting from these sources:  National Cancer Institute: www.cancer.gov  American Cancer Society: www.cancer.org  When should I seek medical care? Seek medical care if you have:  White or other discolored patches in your mouth.  Difficulty swallowing.  A change in your voice.  Unexplained weight loss.  Stomach pain, nausea, or vomiting.  Summary  Smokeless tobacco contains at least 28 different chemicals that are known to cause cancer (carcinogen).  Nicotine is an addictive chemical in smokeless tobacco.  When you quit using smokeless tobacco, you lower your risk of developing cancer. This information is not intended to replace advice given to you by your health care provider. Make sure you discuss any questions you have with your health care provider. Document Released: 09/13/2010 Document Revised: 12/05/2015 Document Reviewed: 11/21/2014 Elsevier Interactive Patient  Education  2017 [employer]

## 2016-11-21 ENCOUNTER — Telehealth: Payer: Self-pay | Admitting: Family

## 2016-11-21 NOTE — Telephone Encounter (Signed)
-----   Message from Annye RuskSuzanne L Nickel, NP sent at 11/18/2016  8:13 PM EDT ----- Regarding: follow up 1 year instead of 3 years Dear schedulers, After reviewing his carotid duplex later today, he needs to return in 1 year instead of 3 years as I indicated on the blue sheet. I called pt and spoke with him about this. Thank you, Rosalita ChessmanSuzanne

## 2016-12-24 ENCOUNTER — Other Ambulatory Visit: Payer: Self-pay | Admitting: Cardiology

## 2017-03-01 ENCOUNTER — Other Ambulatory Visit: Payer: Self-pay | Admitting: Cardiology

## 2017-03-01 NOTE — Telephone Encounter (Signed)
Rx has been sent to the pharmacy electronically. ° °

## 2017-03-08 NOTE — Progress Notes (Signed)
HPI: FU coronary disease. He underwent cardiac catheterization in 2007 and had a bare-metal stent to his LAD. Patient had repeat catheterization November 2017 and was found to have an 80% posterior lateral, significant RCA disease and ejection fraction 55-65%. Patient had 3 drug-eluting stents placed in the right coronary artery at that time. Also with cerebrovascular disease followed by vascular surgery. Abdominal ultrasound May 2018 showed no aneurysm. Since last seen, the patient denies any dyspnea on exertion, orthopnea, PND, pedal edema, palpitations, syncope or chest pain.   Current Outpatient Medications  Medication Sig Dispense Refill  . aspirin EC 81 MG tablet Take 1 tablet (81 mg total) by mouth daily. 90 tablet 3  . BRILINTA 90 MG TABS tablet TAKE 1 TABLET BY MOUTH TWICE DAILY 180 tablet 1  . metoprolol tartrate (LOPRESSOR) 25 MG tablet TAKE 1 TABLET(25 MG) BY MOUTH TWICE DAILY 180 tablet 3  . Multiple Vitamin (MULTIVITAMIN WITH MINERALS) TABS tablet Take 1 tablet by mouth daily.    . nitroGLYCERIN (NITROSTAT) 0.4 MG SL tablet Place 1 tablet (0.4 mg total) under the tongue every 5 (five) minutes as needed for chest pain. 25 tablet 2  . pantoprazole (PROTONIX) 40 MG tablet TAKE 1 TABLET BY MOUTH EVERY DAY 90 tablet 1  . rosuvastatin (CRESTOR) 40 MG tablet TAKE 1 TABLET(40 MG) BY MOUTH DAILY 90 tablet 0  . ticagrelor (BRILINTA) 90 MG TABS tablet Take 1 tablet (90 mg total) by mouth 2 (two) times daily. 180 tablet 3   No current facility-administered medications for this visit.      Past Medical History:  Diagnosis Date  . BARRETTS ESOPHAGUS   . Carotid artery occlusion   . COLONIC POLYPS, HX OF   . COPD   . CORONARY ARTERY DISEASE    a. 2007: cath showing 95% stenosis proximal LAD (BMS placed), 40% D1, and 75% RCA stenosis. b. NST 08/2008: no evidence of ischemia c. NST 04/2012: no evidence of ischemia.  Marland Kitchen. GERD   . GLAUCOMA   . HYPERCHOLESTEROLEMIA   . HYPERLIPIDEMIA     . NSTEMI (non-ST elevated myocardial infarction) (HCC) 02/25/2016  . Other constipation   . OTITIS EXTERNA   . Peripheral vascular disease (HCC)   . Thoracic aorta atherosclerosis [facility](HCC)     Past Surgical History:  Procedure Laterality Date  . CARDIAC CATHETERIZATION N/A 02/26/2016   Procedure: Left Heart Cath and Coronary Angiography;  Surgeon: Iran OuchMuhammad A Arida, MD;  Location: [facility] INVASIVE CV LAB;  Service: Cardiovascular;  Laterality: N/A;  . CARDIAC CATHETERIZATION N/A 02/26/2016   Procedure: Coronary Stent Intervention;  Surgeon: Iran OuchMuhammad A Arida, MD;  Location: [facility] INVASIVE CV LAB;  Service: Cardiovascular;  Laterality: N/A;  . CORONARY STENT PLACEMENT    . ESOPHAGOGASTRODUODENOSCOPY    . UMBILICAL HERNIA REPAIR      Social History   Socioeconomic History  . Marital status: Married    Spouse name: Not on file  . Number of children: Not on file  . Years of education: Not on file  . Highest education level: Not on file  Social Needs  . Financial resource strain: Not on file  . Food insecurity - worry: Not on file  . Food insecurity - inability: Not on file  . Transportation needs - medical: Not on file  . Transportation needs - non-medical: Not on file  Occupational History  . Not on file  Tobacco Use  . Smoking status: Former Smoker    Packs/day: 0.10  Years: 47.00    Pack years: 4.70    Types: Cigarettes    Last attempt to quit: 09/24/2013    Years since quitting: 3.4  . Smokeless tobacco: Current User    Types: Chew  Substance and Sexual Activity  . Alcohol use: Yes    Alcohol/week: 1.2 oz    Types: 2 Cans of beer per week    Comment: occ  . Drug use: No  . Sexual activity: Not on file  Other Topics Concern  . Not on file  Social History Narrative   No living will    Would want wife, then son, would be health care POA   Would accept resuscitation attempts--but no prolonged artificial ventilation   Not sure about tube feeds    Family History  Problem  Relation Age of Onset  . Heart disease Father [id number]       Before age 65- Open Heart surgery  . Hyperlipidemia Mother     ROS: no fevers or chills, productive cough, hemoptysis, dysphasia, odynophagia, melena, hematochezia, dysuria, hematuria, rash, seizure activity, orthopnea, PND, pedal edema, claudication. Remaining systems are negative.  Physical Exam: Well-developed well-nourished in no acute distress.  Skin is warm and dry.  HEENT is normal.  Neck is supple.  Chest is clear to auscultation with normal expansion.  Cardiovascular exam is regular rate and rhythm.  Abdominal exam nontender or distended. No masses palpated. Extremities show no edema. neuro grossly intact    A/P  1 coronary artery disease-patient is doing well with no chest pain. Plan to continue medical therapy. Continue aspirin and statin. It has now been over one year since prior PCI. Discontinue Brilinta.  2 hypertension-pressure controlled. Continue present medications.  3 hyperlipidemia-continue statin. LDL June 2018 39. Liver functions normal.  4 carotid artery disease-this is followed by vascular surgery. Continue aspirin and statin.  Olga MillersBrian Imonie Tuch, MD

## 2017-03-10 DIAGNOSIS — H40003 Preglaucoma, unspecified, bilateral: Secondary | ICD-10-CM | POA: Diagnosis not present

## 2017-03-18 ENCOUNTER — Other Ambulatory Visit: Payer: Self-pay | Admitting: Cardiology

## 2017-03-18 DIAGNOSIS — I2510 Atherosclerotic heart disease of native coronary artery without angina pectoris: Secondary | ICD-10-CM

## 2017-03-21 ENCOUNTER — Ambulatory Visit: Payer: PPO | Admitting: Cardiology

## 2017-03-21 ENCOUNTER — Encounter: Payer: Self-pay | Admitting: Cardiology

## 2017-03-21 VITALS — BP 124/78 | HR 72 | Ht 71.0 in | Wt 184.0 lb

## 2017-03-21 DIAGNOSIS — E7800 Pure hypercholesterolemia, unspecified: Secondary | ICD-10-CM

## 2017-03-21 DIAGNOSIS — I6523 Occlusion and stenosis of bilateral carotid arteries: Secondary | ICD-10-CM | POA: Diagnosis not present

## 2017-03-21 DIAGNOSIS — I2510 Atherosclerotic heart disease of native coronary artery without angina pectoris: Secondary | ICD-10-CM | POA: Diagnosis not present

## 2017-03-21 DIAGNOSIS — I10 Essential (primary) hypertension: Secondary | ICD-10-CM | POA: Diagnosis not present

## 2017-03-21 NOTE — Patient Instructions (Signed)
Medication Instructions:   STOP BRILINTA WHEN CURRENT BOTTLE EMPTY  Follow-Up:  Your physician wants you to follow-up in: ONE YEAR WITH DR Shelda PalRENSHAW You will receive a reminder letter in the mail two months in advance. If you don't receive a letter, please call our office to schedule the follow-up appointment.   If you need a refill on your cardiac medications before your next appointment, please call your pharmacy.

## 2017-05-01 DIAGNOSIS — H401113 Primary open-angle glaucoma, right eye, severe stage: Secondary | ICD-10-CM | POA: Diagnosis not present

## 2017-06-17 ENCOUNTER — Other Ambulatory Visit: Payer: Self-pay | Admitting: Cardiology

## 2017-06-17 DIAGNOSIS — I2510 Atherosclerotic heart disease of native coronary artery without angina pectoris: Secondary | ICD-10-CM

## 2017-06-19 NOTE — Telephone Encounter (Signed)
REFILL 

## 2017-07-10 DIAGNOSIS — H401113 Primary open-angle glaucoma, right eye, severe stage: Secondary | ICD-10-CM | POA: Diagnosis not present

## 2017-08-13 ENCOUNTER — Other Ambulatory Visit: Payer: Self-pay | Admitting: Cardiology

## 2017-08-14 NOTE — Telephone Encounter (Signed)
Rx request sent to pharmacy.  

## 2017-10-30 ENCOUNTER — Ambulatory Visit (INDEPENDENT_AMBULATORY_CARE_PROVIDER_SITE_OTHER): Payer: PPO | Admitting: Family Medicine

## 2017-10-30 ENCOUNTER — Encounter: Payer: Self-pay | Admitting: Family Medicine

## 2017-10-30 VITALS — BP 118/62 | HR 68 | Temp 98.0°F | Ht 69.0 in | Wt 180.8 lb

## 2017-10-30 DIAGNOSIS — N50819 Testicular pain, unspecified: Secondary | ICD-10-CM | POA: Diagnosis not present

## 2017-10-30 LAB — POC URINALSYSI DIPSTICK (AUTOMATED)
Bilirubin, UA: NEGATIVE
Blood, UA: NEGATIVE
Glucose, UA: NEGATIVE
Ketones, UA: NEGATIVE
Leukocytes, UA: NEGATIVE
Nitrite, UA: NEGATIVE
Protein, UA: NEGATIVE
Spec Grav, UA: 1.015
Urobilinogen, UA: 0.2 U/dL
pH, UA: 6

## 2017-10-30 NOTE — Progress Notes (Addendum)
BP 118/62 (BP Location: Left Arm, Patient Position: Sitting, Cuff Size: Normal)   Pulse 68   Temp 98 F (36.7 C) (Oral)   Ht 5\' 9"  (1.753 m)   Wt 180 lb 12 oz (82 kg)   SpO2 95%   BMI 26.69 kg/m    CC: testicular pain Subjective:    Patient ID: [name] [name], male    DOB: April 07, 1951, 67 y.o.   MRN: [id number]  HPI: [name] [name] is a 67 y.o. male presenting on 10/30/2017 for Testicle Pain (C/o intermittent testicle pain for about 2 mos. Denies any swelling but has some nausea when pain occurs. )   2-3 mo h/o episodes of severe scrotal pain "feels like a hit in the" scrotum - marked pain lasts 5-10 minutes then slowly resolves on its own. Pain associated with nausea. Hasn't tried anything for this yet. Last episode was last week.   Denies abdominal pain, bowel changes, vomiting, diarrhea/constipation, blood in stool, fevers/chills. Denies dysuria, urethral discharge. Denies testicular or scrotal swelling.  Denies inciting trauma/injury.  Relevant past medical, surgical, family and social history reviewed and updated as indicated. Interim medical history since our last visit reviewed. Allergies and medications reviewed and updated. Outpatient Medications Prior to Visit  Medication Sig Dispense Refill  . aspirin EC 81 MG tablet Take 1 tablet (81 mg total) by mouth daily. 90 tablet 3  . metoprolol tartrate (LOPRESSOR) 25 MG tablet Take 1 tablet (25 mg total) by mouth 2 (two) times daily. 180 tablet 0  . Multiple Vitamin (MULTIVITAMIN WITH MINERALS) TABS tablet Take 1 tablet by mouth daily.    . nitroGLYCERIN (NITROSTAT) 0.4 MG SL tablet Place 1 tablet (0.4 mg total) under the tongue every 5 (five) minutes as needed for chest pain. 25 tablet 2  . pantoprazole (PROTONIX) 40 MG tablet Take 1 tablet (40 mg total) by mouth daily. 90 tablet 0  . rosuvastatin (CRESTOR) 40 MG tablet TAKE 1 TABLET(40 MG) BY MOUTH DAILY 90 tablet 3   No facility-administered medications prior to visit.       Per HPI unless specifically indicated in ROS section below Review of Systems     Objective:    BP 118/62 (BP Location: Left Arm, Patient Position: Sitting, Cuff Size: Normal)   Pulse 68   Temp 98 F (36.7 C) (Oral)   Ht 5\' 9"  (1.753 m)   Wt 180 lb 12 oz (82 kg)   SpO2 95%   BMI 26.69 kg/m   Wt Readings from Last 3 Encounters:  10/30/17 180 lb 12 oz (82 kg)  03/21/17 184 lb (83.5 kg)  11/18/16 183 lb (83 kg)    Physical Exam  Constitutional: He appears well-developed and well-nourished. No distress.  HENT:  Mouth/Throat: Oropharynx is clear and moist. No oropharyngeal exudate.  Abdominal: Soft. Bowel sounds are normal. He exhibits no distension and no mass. There is no hepatosplenomegaly. There is no tenderness. There is no rigidity, no rebound, no guarding, no CVA tenderness and negative Murphy's sign. No hernia. Hernia confirmed negative in the right inguinal area and confirmed negative in the left inguinal area.  Genitourinary: Testes normal and penis normal. Right testis shows no mass, no swelling and no tenderness. Right testis is descended. Left testis shows no mass, no swelling and no tenderness. Left testis is descended.  Genitourinary Comments: R testicle decreased size  Musculoskeletal: Normal range of motion.  Lymphadenopathy: No inguinal adenopathy noted on the right or left side.  Nursing note and  vitals reviewed.  Results for orders placed or performed in visit on 10/30/17  POCT Urinalysis Dipstick (Automated)  Result Value Ref Range   Color, UA yellow    Clarity, UA clear    Glucose, UA Negative Negative   Bilirubin, UA negative    Ketones, UA negative    Spec Grav, UA 1.015 1.010 - 1.025   Blood, UA negative    pH, UA 6.0 5.0 - 8.0   Protein, UA Negative Negative   Urobilinogen, UA 0.2 0.2 or 1.0 E.U./dL   Nitrite, UA negative    Leukocytes, UA Negative Negative      Assessment & Plan:   Problem List Items Addressed This Visit    Testicle pain -  Primary    29mo h/o recurrent episodes of testicular/scrotal pain without obvious hernia or masses. Possible R testicular atrophy. Unclear cause - ddx includes inguinal hernia, congestive epididymitis, nephrolithiasis, referred pain from prostate or lumbar spine. UA normal. Check testicular US r/o torsion. Update if persistent/recurrent for urology eval.       Relevant Orders   POCT Urinalysis Dipstick (Automated) (Completed)   US SCROTUM W/DOPPLER       No orders of the defined types were placed in this encounter.  Orders Placed This Encounter  Procedures  . US SCROTUM W/DOPPLER    Standing Status:   Future    Standing Expiration Date:   01/01/2019    Order Specific Question:   Reason for Exam (SYMPTOM  OR DIAGNOSIS REQUIRED)    Answer:   eval intermittent testicular pain    Order Specific Question:   Preferred imaging location?    Answer:   [facility]  . POCT Urinalysis Dipstick (Automated)    Follow up plan: Return if symptoms worsen or fail to improve.  Eustaquio BoydenJavier Efrat Zuidema, MD

## 2017-10-30 NOTE — Patient Instructions (Addendum)
Let's order ultrasound - see our referral coordinators to get this scheduled. We will be in touch with results.  If persistent pain that isn't getting better, seek urgent care.

## 2017-10-30 NOTE — Assessment & Plan Note (Addendum)
28mo h/o recurrent episodes of testicular/scrotal pain without obvious hernia or masses. Possible R testicular atrophy. Unclear cause - ddx includes inguinal hernia, congestive epididymitis, nephrolithiasis, referred pain from prostate or lumbar spine. UA normal. Check testicular US r/o torsion. Update if persistent/recurrent for urology eval.

## 2017-11-01 ENCOUNTER — Ambulatory Visit
Admission: RE | Admit: 2017-11-01 | Discharge: 2017-11-01 | Disposition: A | Discharge status: Home / Self Care | Payer: PPO | Source: Ambulatory Visit | Attending: Family Medicine | Admitting: Family Medicine

## 2017-11-01 DIAGNOSIS — N433 Hydrocele, unspecified: Secondary | ICD-10-CM | POA: Diagnosis not present

## 2017-11-01 DIAGNOSIS — N50819 Testicular pain, unspecified: Secondary | ICD-10-CM | POA: Diagnosis not present

## 2017-11-19 ENCOUNTER — Other Ambulatory Visit: Payer: Self-pay | Admitting: Cardiology

## 2017-11-20 ENCOUNTER — Other Ambulatory Visit: Payer: Self-pay

## 2017-11-20 DIAGNOSIS — I6523 Occlusion and stenosis of bilateral carotid arteries: Secondary | ICD-10-CM

## 2017-12-06 ENCOUNTER — Other Ambulatory Visit: Payer: Self-pay | Admitting: Cardiology

## 2017-12-07 NOTE — Telephone Encounter (Signed)
Rx request sent to pharmacy.  

## 2017-12-19 ENCOUNTER — Ambulatory Visit (HOSPITAL_COMMUNITY): Payer: PPO

## 2017-12-19 ENCOUNTER — Ambulatory Visit: Payer: PPO | Admitting: Family

## 2018-01-08 ENCOUNTER — Ambulatory Visit (HOSPITAL_COMMUNITY)
Admission: RE | Admit: 2018-01-08 | Discharge: 2018-01-08 | Disposition: A | Discharge status: Home / Self Care | Payer: PPO | Source: Ambulatory Visit | Attending: Family | Admitting: Family

## 2018-01-08 ENCOUNTER — Encounter: Payer: Self-pay | Admitting: Family

## 2018-01-08 ENCOUNTER — Ambulatory Visit: Payer: PPO | Admitting: Family

## 2018-01-08 ENCOUNTER — Other Ambulatory Visit: Payer: Self-pay

## 2018-01-08 VITALS — BP 117/70 | HR 64 | Temp 97.0°F | Resp 16 | Ht 71.0 in | Wt 177.0 lb

## 2018-01-08 DIAGNOSIS — Z87891 Personal history of nicotine dependence: Secondary | ICD-10-CM | POA: Diagnosis not present

## 2018-01-08 DIAGNOSIS — E785 Hyperlipidemia, unspecified: Secondary | ICD-10-CM | POA: Insufficient documentation

## 2018-01-08 DIAGNOSIS — Z720 Tobacco use: Secondary | ICD-10-CM

## 2018-01-08 DIAGNOSIS — I10 Essential (primary) hypertension: Secondary | ICD-10-CM | POA: Insufficient documentation

## 2018-01-08 DIAGNOSIS — I6523 Occlusion and stenosis of bilateral carotid arteries: Secondary | ICD-10-CM | POA: Diagnosis not present

## 2018-01-08 NOTE — Progress Notes (Signed)
Chief Complaint: Follow up Extracranial Carotid Artery Stenosis   History of Present Illness  [name] [name] is a 67 y.o. male patient whom Dr. Edilia Boickson has been monitoring for carotid artery stenosis.  Patient has not had previous carotid artery intervention.  Pt denies anyhistory of TIA or stroke symptoms.specifically he denies a history ofamaurosis fugax or monocular blindness, unilateralfacial drooping,hemiplegia, or receptive or expressive aphasia.   He denies claudication symptoms with walking, denies history of MI, but had 4 cardiac stents placed.  Diabetic: No Tobacco use: formersmoker (quit about May 2015). "I chew a little tobacco"  Pt meds include: Statin :yes ASA: Yes Other anticoagulants/antiplatelets: no    Past Medical History:  Diagnosis Date  . BARRETTS ESOPHAGUS   . Carotid artery occlusion   . COLONIC POLYPS, HX OF   . COPD   . CORONARY ARTERY DISEASE    a. 2007: cath showing 95% stenosis proximal LAD (BMS placed), 40% D1, and 75% RCA stenosis. b. NST 08/2008: no evidence of ischemia c. NST 04/2012: no evidence of ischemia.  Marland Kitchen. GERD   . GLAUCOMA   . HYPERCHOLESTEROLEMIA   . HYPERLIPIDEMIA   . NSTEMI (non-ST elevated myocardial infarction) (HCC) 02/25/2016  . Other constipation   . OTITIS EXTERNA   . Peripheral vascular disease (HCC)   . Thoracic aorta atherosclerosis [facility](HCC)     Social History Social History   Tobacco Use  . Smoking status: Former Smoker    Packs/day: 0.10    Years: 47.00    Pack years: 4.70    Types: Cigarettes    Last attempt to quit: 09/24/2013    Years since quitting: 4.2  . Smokeless tobacco: Current User    Types: Chew  Substance Use Topics  . Alcohol use: Yes    Alcohol/week: 2.0 standard drinks    Types: 2 Cans of beer per week    Comment: occ  . Drug use: No    Family History Family History  Problem Relation Age of Onset  . Heart disease Father [id number]       Before age 66- Open Heart surgery  .  Hyperlipidemia Mother     Surgical History Past Surgical History:  Procedure Laterality Date  . CARDIAC CATHETERIZATION N/A 02/26/2016   Procedure: Left Heart Cath and Coronary Angiography;  Surgeon: Iran OuchMuhammad A Arida, MD;  Location: [facility] INVASIVE CV LAB;  Service: Cardiovascular;  Laterality: N/A;  . CARDIAC CATHETERIZATION N/A 02/26/2016   Procedure: Coronary Stent Intervention;  Surgeon: Iran OuchMuhammad A Arida, MD;  Location: [facility] INVASIVE CV LAB;  Service: Cardiovascular;  Laterality: N/A;  . CORONARY STENT PLACEMENT    . ESOPHAGOGASTRODUODENOSCOPY    . UMBILICAL HERNIA REPAIR      No Known Allergies  Current Outpatient Medications  Medication Sig Dispense Refill  . aspirin EC 81 MG tablet Take 1 tablet (81 mg total) by mouth daily. 90 tablet 3  . metoprolol tartrate (LOPRESSOR) 25 MG tablet TAKE 1 TABLET(25 MG) BY MOUTH TWICE DAILY 180 tablet 1  . Multiple Vitamin (MULTIVITAMIN WITH MINERALS) TABS tablet Take 1 tablet by mouth daily.    . nitroGLYCERIN (NITROSTAT) 0.4 MG SL tablet Place 1 tablet (0.4 mg total) under the tongue every 5 (five) minutes as needed for chest pain. 25 tablet 2  . pantoprazole (PROTONIX) 40 MG tablet TAKE 1 TABLET(40 MG) BY MOUTH DAILY 90 tablet 0  . rosuvastatin (CRESTOR) 40 MG tablet TAKE 1 TABLET(40 MG) BY MOUTH DAILY 90 tablet 3   No current facility-administered  medications for this visit.     Review of Systems : See HPI for pertinent positives and negatives.  Physical Examination  Vitals:   01/08/18 1330 01/08/18 1334  BP: 125/78 117/70  Pulse: 64 64  Resp: 16   Temp: (!) 97 F (36.1 C)   TempSrc: Oral   SpO2: 98%   Weight: 177 lb (80.3 kg)   Height: 5\' 11"  (1.803 m)    Body mass index is 24.69 kg/m.  General:  WDWN male in NAD GAIT:normal HENT: no gross abnormalities  Eyes: PERRLA Pulmonary: Respirations are non-labored, CTAB Cardiac: regular rhythm and rate, no detected murmur  VASCULAR EXAM Carotid Bruits Left Right    Positive Negative   Abdominal aortic pusle is not palpable Radial pulses are 2+ palpable and equal.      LE Pulses LEFT RIGHT   POPLITEAL not palpable  not palpable   POSTERIOR TIBIAL  palpable   palpable    DORSALIS PEDIS  ANTERIOR TIBIAL palpable  palpable     Gastrointestinal: soft, nontender, BS WNL, no r/g, no palpated masses. Musculoskeletal: No muscle atrophy/wasting. M/S 5/5 throughout, Extremities without ischemic changes. Neurologic: A&O X 3; appropriate affect, speech is normal, CN 2-12 intact, pain and light touch intact in extremities, motor exam as listed above. Skin: No rashes, no ulcers, no cellulitis.   Psychiatric: Normal thought content, mood appropriate to clinical situation.    Assessment: [name] [name] is a 67 y.o. male who has no history of stroke or TIA.  Fortunately he does not have DM and he quit smoking in May of 2015. He admits to using using smokeless tobacco and I counseled him against the use of this. I gave himseveral free resources re tobaccocessation.   DATA Carotid Duplex (01-08-18): Right ICA: 40-59% stenosis Left ICA: 1-39% stenosis Left CCA and ECA with >50% stenosis Increased stenosis in the right ICA compared to the exam on 11-12-16.   Abdominal aorta duplex in May 2018 for abdominal bruit showed normal caliber abdominal aorta and iliac arteries.    Plan: Follow-up in 1year with Carotid Duplex scan.   Over 3 minutes was spent counseling patient re smokeless tobacco cessation, and patient was given several free resources re this.   I discussed in depth with the patient the nature of atherosclerosis, and emphasized the importance of maximal medical management including strict control of blood pressure, blood glucose, and lipid levels,  obtaining regular exercise, and cessation of tobaccos use.  The patient is aware that without maximal medical management the underlying atherosclerotic disease process will progress, limiting the benefit of any interventions. The patient was given information about stroke prevention and what symptoms should prompt the patient to seek immediate medical care. Thank you for allowing [place] to participate in this patient's care.  Charisse March, RN, MSN, FNP-C [facility] Office: [phone]  Clinic Physician: Myra Gianotti  01/08/18 1:37 PM

## 2018-01-08 NOTE — Patient Instructions (Addendum)
What You Need to Know About Smokeless Tobacco Use Tobacco use is one of the leading causes of cancer and other chronic health problems. Smokeless tobacco is tobacco that is put directly into the mouth instead of being smoked. It may also be called chewing tobacco or snuff. Smokeless tobacco is made from the leaves of tobacco plants and it comes in several forms:  Loose, dry leaves, plugs, or twists.  Moist pouches.  Dissolving lozenges or strips.  Chewing, sucking, or holding the tobacco in your mouth causes your mouth to make more saliva. The saliva mixes with the tobacco to make "tobacco juice" that is swallowed or spit out. How can smokeless tobacco affect me? Using smokeless tobacco:  Increases your risk of developing cancer. Smokeless tobacco contains at least 28 different types of cancer-causing chemicals (carcinogens).  Increases your chances of developing other long-term health problems, including high blood pressure, heart disease, stroke, and dental problems.  Can make you become addicted. Nicotine is one of the chemicals in tobacco. When you chew tobacco, you absorb nicotine from the tobacco juice. This can make you feel more alert than usual.  Can cause problems with pregnancy. Pregnant women who use smokeless tobacco are more likely to miscarry or deliver a baby too early (premature delivery).  Can affect the appearance and health of your mouth. Using smokeless tobacco may cause bad breath, yellow-brown teeth, mouth sores, cracking and bleeding lips, gum recession, and lesions on the soft tissues of your mouth (leukoplakia).  What are the benefits of not using smokeless tobacco? The benefits of not using smokeless tobacco include:  A healthy mind because: ? You avoid addiction.  A healthy body because: ? You avoid dental problems. ? You promote healthy pregnancy. ? You avoid long-term health problems.  A healthy wallet because: ? You avoid costs of buying  tobacco. ? You avoid health care costs in the future.  A healthy family because: ? You avoid accidental poisoning of children in your household.  What can happen if I continue to use smokeless tobacco? If you continue to use smokeless tobacco, you will increase your risk for developing certain cancers. These include:  Tongue.  Lips, mouth, and gums.  Throat (esophagus) and voice box (larynx).  Stomach.  Pancreas.  Bladder.  Colon.  Long-term use of smokeless tobacco can also lead to:  High blood pressure, heart disease, and stroke.  Gum disease, gum recession, and bone loss around the teeth.  Tooth decay.  How do I quit using smokeless tobacco? Quitting the use of smokeless tobacco can be hard, but it can be done. Follow these steps:  Pick a date to quit. Set a date within the next two weeks. This gives you time to prepare.  Write down the reasons why you are quitting. Keep this list in places where you will see it often, such as on your bathroom mirror or in your car or wallet.  Identify the people, places, things, and activities that make you want to use tobacco (triggers) and avoid them.  Get rid of any tobacco you have and remove any tobacco smells. To do this: ? Throw away all containers of tobacco at home, at work, and in your car. ? Throw away any other items that you use regularly when you chew tobacco. ? Clean your car and make sure to remove all tobacco-related items. ? Clean your home, including curtains and carpets.  Tell your family, friends, and coworkers that you are quitting. This can make quitting   easier.  Ask your health care provider for help quitting smokeless tobacco. This may involve treatment. Find out what treatment options are covered by your health insurance.  Keep track of how many days have passed since you quit. Remembering how long and hard you have worked to quit can help you avoid using tobacco again.  Where can I get support? Ask  your health care provider if there is a local support group for quitting smokeless tobacco. Where can I get more information? You can learn more about the risks of using smokeless tobacco and the benefits of quitting from these sources:  National Cancer Institute: www.cancer.gov  American Cancer Society: www.cancer.org  When should I seek medical care? Seek medical care if you have:  White or other discolored patches in your mouth.  Difficulty swallowing.  A change in your voice.  Unexplained weight loss.  Stomach pain, nausea, or vomiting.  Summary  Smokeless tobacco contains at least 28 different chemicals that are known to cause cancer (carcinogen).  Nicotine is an addictive chemical in smokeless tobacco.  When you quit using smokeless tobacco, you lower your risk of developing cancer. This information is not intended to replace advice given to you by your health care provider. Make sure you discuss any questions you have with your health care provider. Document Released: 09/13/2010 Document Revised: 12/05/2015 Document Reviewed: 11/21/2014 Elsevier Interactive Patient Education  2018 [employer] Inc[employer]     Stroke Prevention Some health problems and behaviors may make it more likely for you to have a stroke. Below are ways to lessen your risk of having a stroke.  Be active for at least 30 minutes on most or all days.  Do not smoke. Try not to be around others who smoke.  Do not drink too much alcohol. ? Do not have more than 2 drinks a day if you are a man. ? Do not have more than 1 drink a day if you are a woman and are not pregnant.  Eat healthy foods, such as fruits and vegetables. If you were put on a specific diet, follow the diet as told.  Keep your cholesterol levels under control through diet and medicines. Look for foods that are low in saturated fat, trans fat, cholesterol, and are high in fiber.  If you have diabetes, follow all diet plans and take your  medicine as told.  Ask your doctor if you need treatment to lower your blood pressure. If you have high blood pressure (hypertension), follow all diet plans and take your medicine as told by your doctor.  If you are 18-39 years old, have your blood pressure checked every 3-5 years. If you are age 40 or older, have your blood pressure checked every year.  Keep a healthy weight. Eat foods that are low in calories, salt, saturated fat, trans fat, and cholesterol.  Do not take drugs.  Avoid birth control pills, if this applies. Talk to your doctor about the risks of taking birth control pills.  Talk to your doctor if you have sleep problems (sleep apnea).  Take all medicine as told by your doctor. ? You may be told to take aspirin or blood thinner medicine. Take this medicine as told by your doctor. ? Understand your medicine instructions.  Make sure any other conditions you have are being taken care of.  Get help right away if:  You suddenly lose feeling (you feel numb) or have weakness in your face, arm, or leg.  Your face or eyelid   hangs down to one side.  You suddenly feel confused.  You have trouble talking (aphasia) or understanding what people are saying.  You suddenly have trouble seeing in one or both eyes.  You suddenly have trouble walking.  You are dizzy.  You lose your balance or your movements are clumsy (uncoordinated).  You suddenly have a very bad headache and you do not know the cause.  You have new chest pain.  Your heart feels like it is fluttering or skipping a beat (irregular heartbeat). Do not wait to see if the symptoms above go away. Get help right away. Call your local emergency services (911 in [place] Do not drive yourself to the hospital. This information is not intended to replace advice given to you by your health care provider. Make sure you discuss any questions you have with your health care provider. Document Released: 10/11/2011 Document  Revised: 09/17/2015 Document Reviewed: 10/12/2012 Elsevier Interactive Patient Education  2018 Elsevier Inc.  

## 2018-02-15 ENCOUNTER — Other Ambulatory Visit: Payer: Self-pay | Admitting: Cardiology

## 2018-04-19 ENCOUNTER — Encounter: Payer: Self-pay | Admitting: Cardiology

## 2018-05-14 NOTE — Progress Notes (Signed)
HPI: FU coronary disease. He underwent cardiac catheterization in 2007 and had a bare-metal stent to his LAD.Patient had repeat catheterization November 2017 and was found to have an 80% posterior lateral,significant RCA disease and ejection fraction 55-65%. Patient had 3 drug-eluting stents placed in the right coronary artery at that time.Also with cerebrovascular disease followed by vascular surgery. Abdominal ultrasound May 2018 showed no aneurysm. Since last seen, patient denies dyspnea.  He has occasional "indigestion".  It is improved with Tums.  Question similar to previous cardiac pain.  He does not have exertional chest pain.  No syncope.  Current Outpatient Medications  Medication Sig Dispense Refill  . aspirin EC 81 MG tablet Take 1 tablet (81 mg total) by mouth daily. 90 tablet 3  . metoprolol tartrate (LOPRESSOR) 25 MG tablet TAKE 1 TABLET(25 MG) BY MOUTH TWICE DAILY 180 tablet 0  . Multiple Vitamin (MULTIVITAMIN WITH MINERALS) TABS tablet Take 1 tablet by mouth daily.    . nitroGLYCERIN (NITROSTAT) 0.4 MG SL tablet Place 1 tablet (0.4 mg total) under the tongue every 5 (five) minutes as needed for chest pain. 25 tablet 2  . pantoprazole (PROTONIX) 40 MG tablet TAKE 1 TABLET(40 MG) BY MOUTH DAILY 30 tablet 0  . rosuvastatin (CRESTOR) 40 MG tablet TAKE 1 TABLET(40 MG) BY MOUTH DAILY 90 tablet 3   No current facility-administered medications for this visit.      Past Medical History:  Diagnosis Date  . BARRETTS ESOPHAGUS   . Carotid artery occlusion   . COLONIC POLYPS, HX OF   . COPD   . CORONARY ARTERY DISEASE    a. 2007: cath showing 95% stenosis proximal LAD (BMS placed), 40% D1, and 75% RCA stenosis. b. NST 08/2008: no evidence of ischemia c. NST 04/2012: no evidence of ischemia.  Marland Kitchen GERD   . GLAUCOMA   . HYPERCHOLESTEROLEMIA   . HYPERLIPIDEMIA   . NSTEMI (non-ST elevated myocardial infarction) (HCC) 02/25/2016  . Other constipation   . OTITIS EXTERNA   .  Peripheral vascular disease (HCC)   . Thoracic aorta atherosclerosis [facility])     Past Surgical History:  Procedure Laterality Date  . CARDIAC CATHETERIZATION N/A 02/26/2016   Procedure: Left Heart Cath and Coronary Angiography;  Surgeon: Iran Ouch, MD;  Location: [facility] INVASIVE CV LAB;  Service: Cardiovascular;  Laterality: N/A;  . CARDIAC CATHETERIZATION N/A 02/26/2016   Procedure: Coronary Stent Intervention;  Surgeon: Iran Ouch, MD;  Location: [facility] INVASIVE CV LAB;  Service: Cardiovascular;  Laterality: N/A;  . CORONARY STENT PLACEMENT    . ESOPHAGOGASTRODUODENOSCOPY    . UMBILICAL HERNIA REPAIR      Social History   Socioeconomic History  . Marital status: Married    Spouse name: Not on file  . Number of children: Not on file  . Years of education: Not on file  . Highest education level: Not on file  Occupational History  . Not on file  Social Needs  . Financial resource strain: Not on file  . Food insecurity:    Worry: Not on file    Inability: Not on file  . Transportation needs:    Medical: Not on file    Non-medical: Not on file  Tobacco Use  . Smoking status: Former Smoker    Packs/day: 0.10    Years: 47.00    Pack years: 4.70    Types: Cigarettes    Last attempt to quit: 09/24/2013    Years since quitting: 4.6  .  Smokeless tobacco: Current User    Types: Chew  Substance and Sexual Activity  . Alcohol use: Yes    Alcohol/week: 2.0 standard drinks    Types: 2 Cans of beer per week    Comment: occ  . Drug use: No  . Sexual activity: Not on file  Lifestyle  . Physical activity:    Days per week: Not on file    Minutes per session: Not on file  . Stress: Not on file  Relationships  . Social connections:    Talks on phone: Not on file    Gets together: Not on file    Attends religious service: Not on file    Active member of club or organization: Not on file    Attends meetings of clubs or organizations: Not on file    Relationship status: Not  on file  . Intimate partner violence:    Fear of current or ex partner: Not on file    Emotionally abused: Not on file    Physically abused: Not on file    Forced sexual activity: Not on file  Other Topics Concern  . Not on file  Social History Narrative   No living will    Would want wife, then son, would be health care POA   Would accept resuscitation attempts--but no prolonged artificial ventilation   Not sure about tube feeds    Family History  Problem Relation Age of Onset  . Heart disease Father [id number]       Before age 860- Open Heart surgery  . Hyperlipidemia Mother     ROS: no fevers or chills, productive cough, hemoptysis, dysphasia, odynophagia, melena, hematochezia, dysuria, hematuria, rash, seizure activity, orthopnea, PND, pedal edema, claudication. Remaining systems are negative.  Physical Exam: Well-developed well-nourished in no acute distress.  Skin is warm and dry.  HEENT is normal.  Neck is supple.  Chest is clear to auscultation with normal expansion.  Cardiovascular exam is regular rate and rhythm.  Abdominal exam nontender or distended. No masses palpated. Extremities show no edema. neuro grossly intact  ECG-normal sinus rhythm at a rate of 60, RV conduction delay.  Personally reviewed  A/P  1 coronary artery disease-patient denies chest pain.  Plan to continue medical therapy with aspirin and statin.  2 carotid artery disease-followed by vascular surgery.  Continue aspirin and statin.  3 hypertension-patient's blood pressure is controlled.  Continue present medications and follow.  Check potassium and renal function.  4 hyperlipidemia-continue statin.  Check lipids and liver.  5 chest discomfort-may be secondary to indigestion but question similar to previous cardiac pain.  I will arrange a stress nuclear study for risk stratification.  Olga MillersBrian Kadrian Partch, MD

## 2018-05-15 ENCOUNTER — Other Ambulatory Visit: Payer: Self-pay | Admitting: Cardiology

## 2018-05-16 ENCOUNTER — Other Ambulatory Visit: Payer: Self-pay | Admitting: Cardiology

## 2018-05-17 ENCOUNTER — Encounter: Payer: Self-pay | Admitting: Cardiology

## 2018-05-17 ENCOUNTER — Encounter

## 2018-05-17 ENCOUNTER — Ambulatory Visit: Payer: PPO | Admitting: Cardiology

## 2018-05-17 ENCOUNTER — Encounter (INDEPENDENT_AMBULATORY_CARE_PROVIDER_SITE_OTHER): Payer: Self-pay

## 2018-05-17 VITALS — BP 102/64 | HR 60 | Ht 71.0 in | Wt 183.0 lb

## 2018-05-17 DIAGNOSIS — I2510 Atherosclerotic heart disease of native coronary artery without angina pectoris: Secondary | ICD-10-CM | POA: Diagnosis not present

## 2018-05-17 DIAGNOSIS — I10 Essential (primary) hypertension: Secondary | ICD-10-CM | POA: Diagnosis not present

## 2018-05-17 DIAGNOSIS — Z9861 Coronary angioplasty status: Secondary | ICD-10-CM

## 2018-05-17 DIAGNOSIS — R072 Precordial pain: Secondary | ICD-10-CM

## 2018-05-17 DIAGNOSIS — E785 Hyperlipidemia, unspecified: Secondary | ICD-10-CM | POA: Diagnosis not present

## 2018-05-17 LAB — BASIC METABOLIC PANEL
BUN/Creatinine Ratio: 14 (ref 10–24)
BUN: 16 mg/dL (ref 8–27)
CALCIUM: 9.3 mg/dL (ref 8.6–10.2)
CO2: 24 mmol/L (ref 20–29)
CREATININE: 1.17 mg/dL (ref 0.76–1.27)
Chloride: 101 mmol/L (ref 96–106)
GFR, EST AFRICAN AMERICAN: 75 mL/min/{1.73_m2} (ref >59)
GFR, EST NON AFRICAN AMERICAN: 65 mL/min/{1.73_m2} (ref >59)
Glucose: 97 mg/dL (ref 65–99)
Potassium: 5.4 mmol/L — ABNORMAL HIGH (ref 3.5–5.2)
Sodium: 139 mmol/L (ref 134–144)

## 2018-05-17 LAB — HEPATIC FUNCTION PANEL
ALT: 13 IU/L (ref 0–44)
AST: 22 IU/L (ref 0–40)
Albumin: 4.5 g/dL (ref 3.8–4.8)
Alkaline Phosphatase: 67 IU/L (ref 39–117)
BILIRUBIN TOTAL: 0.6 mg/dL (ref 0.0–1.2)
Bilirubin, Direct: 0.18 mg/dL (ref 0.00–0.40)
TOTAL PROTEIN: 6.9 g/dL (ref 6.0–8.5)

## 2018-05-17 LAB — LIPID PANEL
Chol/HDL Ratio: 2.8 ratio (ref 0.0–5.0)
Cholesterol, Total: 128 mg/dL (ref 100–199)
HDL: 45 mg/dL (ref >39)
LDL CALC: 64 mg/dL (ref 0–99)
Triglycerides: 96 mg/dL (ref 0–149)
VLDL CHOLESTEROL CAL: 19 mg/dL (ref 5–40)

## 2018-05-17 NOTE — Patient Instructions (Signed)
Medication Instructions:  Continue same medications If you need a refill on your cardiac medications before your next appointment, please call your pharmacy.   Lab work: Lipid and Hepatic Panels and bmet today If you have labs (blood work) drawn today and your tests are completely normal, you will receive your results only by: Marland Kitchen MyChart Message (if you have MyChart) OR . A paper copy in the mail If you have any lab test that is abnormal or we need to change your treatment, we will call you to review the results.  Testing/Procedures: Schedule Stress Myoview today  Follow-Up: At [facility], you and your health needs are our priority.  As part of our continuing mission to provide you with exceptional heart care, we have created designated Provider Care Teams.  These Care Teams include your primary Cardiologist (physician) and Advanced Practice Providers (APPs -  Physician Assistants and Nurse Practitioners) who all work together to provide you with the care you need, when you need it. Follow up with Dr.Crenshaw in 1 year  Call 2 months before to schedule

## 2018-05-18 ENCOUNTER — Encounter: Payer: Self-pay | Admitting: *Deleted

## 2018-05-23 ENCOUNTER — Telehealth (HOSPITAL_COMMUNITY): Payer: Self-pay

## 2018-05-23 NOTE — Telephone Encounter (Signed)
Encounter complete. 

## 2018-05-24 ENCOUNTER — Telehealth (HOSPITAL_COMMUNITY): Payer: Self-pay

## 2018-05-24 NOTE — Telephone Encounter (Signed)
Encounter complete. 

## 2018-05-25 ENCOUNTER — Telehealth: Payer: Self-pay | Admitting: Cardiology

## 2018-05-25 ENCOUNTER — Ambulatory Visit (HOSPITAL_COMMUNITY)
Admission: RE | Admit: 2018-05-25 | Discharge: 2018-05-25 | Disposition: A | Discharge status: Home / Self Care | Payer: PPO | Source: Ambulatory Visit | Attending: Cardiology | Admitting: Cardiology

## 2018-05-25 DIAGNOSIS — E785 Hyperlipidemia, unspecified: Secondary | ICD-10-CM | POA: Diagnosis not present

## 2018-05-25 DIAGNOSIS — Z9861 Coronary angioplasty status: Secondary | ICD-10-CM | POA: Diagnosis not present

## 2018-05-25 DIAGNOSIS — I2510 Atherosclerotic heart disease of native coronary artery without angina pectoris: Secondary | ICD-10-CM | POA: Diagnosis not present

## 2018-05-25 DIAGNOSIS — I10 Essential (primary) hypertension: Secondary | ICD-10-CM | POA: Diagnosis not present

## 2018-05-25 LAB — MYOCARDIAL PERFUSION IMAGING
CHL CUP NUCLEAR SDS: 4
CHL CUP RESTING HR STRESS: 61 {beats}/min
Estimated workload: 7.7 METS
Exercise duration (min): 6 min
Exercise duration (sec): 31 s
LVDIAVOL: 106 mL (ref 62–150)
LVSYSVOL: 40 mL
MPHR: 154 {beats}/min
NUC STRESS TID: 0.93
Peak HR: 153 {beats}/min
Percent HR: 99 %
RPE: 17
SRS: 2
SSS: 6

## 2018-05-25 MED ORDER — TECHNETIUM TC 99M TETROFOSMIN IV KIT
9.7000 | PACK | Freq: Once | INTRAVENOUS | Status: AC | PRN
  Administered 2018-05-25: 9.7 via INTRAVENOUS
  Filled 2018-05-25: qty 10

## 2018-05-25 MED ORDER — TECHNETIUM TC 99M TETROFOSMIN IV KIT
28.6000 | PACK | Freq: Once | INTRAVENOUS | Status: AC | PRN
  Administered 2018-05-25: 28.6 via INTRAVENOUS
  Filled 2018-05-25: qty 29

## 2018-05-25 NOTE — Telephone Encounter (Signed)
Follow up:    Patient returning call from yesterday concerning some results. Please call patient.

## 2018-05-25 NOTE — Telephone Encounter (Signed)
Notes recorded by Lewayne Buntingrenshaw, [name] S, MD on 05/25/2018 at 3:25 PM EST Normal study [name] MillersBrian [name]  Patient notified of results

## 2018-06-18 ENCOUNTER — Other Ambulatory Visit: Payer: Self-pay | Admitting: Cardiology

## 2018-06-18 MED ORDER — PANTOPRAZOLE SODIUM 40 MG PO TBEC: DELAYED_RELEASE_TABLET | ORAL | 0 refills | Status: DC

## 2018-06-18 NOTE — Telephone Encounter (Signed)
New Message    *STAT* If patient is at the pharmacy, call can be transferred to refill team.   1. Which medications need to be refilled? (please list name of each medication and dose if known) Pantoprazole 40mg   2. Which pharmacy/location (including street and city if local pharmacy) is medication to be sent to? [facility] on [address] in [place]   3. Do they need a 30 day or 90 day supply? 90 Days  PT went to pharmacy and they gave him 30 day supply but he is needing a 90 day supply

## 2018-06-19 ENCOUNTER — Other Ambulatory Visit: Payer: Self-pay | Admitting: Cardiology

## 2018-06-20 ENCOUNTER — Telehealth: Payer: Self-pay | Admitting: Cardiology

## 2018-06-20 NOTE — Telephone Encounter (Signed)
Spoke to patient and wife. Informed both prescription was sent on 06/18/18 with 90 day supply

## 2018-06-20 NOTE — Telephone Encounter (Signed)
New message:   Patient wife calling concerning medication refill. Please call back

## 2018-06-26 MED ORDER — PANTOPRAZOLE SODIUM 40 MG PO TBEC: DELAYED_RELEASE_TABLET | ORAL | 3 refills | Status: DC

## 2018-06-26 NOTE — Telephone Encounter (Signed)
Returned call to patient's wife.She stated protonix refill needs to be sent to [facility] on [address] in [place].Refill sent to pharmacy.

## 2018-06-26 NOTE — Telephone Encounter (Addendum)
Patient's wife called stating it needs to be sent to [facility] [phone] at [facility] in [place]

## 2018-06-26 NOTE — Addendum Note (Signed)
Addended by: Neoma Laming on: 06/26/2018 03:49 PM   Modules accepted: Orders

## 2018-07-04 ENCOUNTER — Other Ambulatory Visit: Payer: Self-pay | Admitting: Cardiology

## 2018-07-04 DIAGNOSIS — I2510 Atherosclerotic heart disease of native coronary artery without angina pectoris: Secondary | ICD-10-CM

## 2018-08-18 ENCOUNTER — Other Ambulatory Visit: Payer: Self-pay | Admitting: Cardiology

## 2018-08-20 NOTE — Telephone Encounter (Signed)
Lopressor 25 mg refilled 

## 2018-11-16 ENCOUNTER — Other Ambulatory Visit: Payer: Self-pay | Admitting: Cardiology

## 2018-11-19 ENCOUNTER — Other Ambulatory Visit: Payer: Self-pay | Admitting: *Deleted

## 2018-11-19 MED ORDER — METOPROLOL TARTRATE 25 MG PO TABS: ORAL_TABLET | ORAL | 2 refills | Status: DC

## 2018-11-22 ENCOUNTER — Other Ambulatory Visit: Payer: Self-pay

## 2019-03-19 ENCOUNTER — Encounter: Payer: Self-pay | Admitting: Family Medicine

## 2019-03-19 ENCOUNTER — Ambulatory Visit (INDEPENDENT_AMBULATORY_CARE_PROVIDER_SITE_OTHER): Payer: PPO | Admitting: Family Medicine

## 2019-03-19 ENCOUNTER — Telehealth: Payer: Self-pay

## 2019-03-19 ENCOUNTER — Other Ambulatory Visit: Payer: Self-pay

## 2019-03-19 ENCOUNTER — Ambulatory Visit: Payer: PPO | Admitting: Family Medicine

## 2019-03-19 VITALS — BP 122/70 | HR 69 | Temp 98.3°F | Ht 71.0 in | Wt 183.2 lb

## 2019-03-19 DIAGNOSIS — I6523 Occlusion and stenosis of bilateral carotid arteries: Secondary | ICD-10-CM | POA: Diagnosis not present

## 2019-03-19 DIAGNOSIS — R42 Dizziness and giddiness: Secondary | ICD-10-CM | POA: Diagnosis not present

## 2019-03-19 LAB — CBC WITH DIFFERENTIAL/PLATELET
Basophils Absolute: 0.1 10*3/uL (ref 0.0–0.1)
Basophils Relative: 0.7 % (ref 0.0–3.0)
Eosinophils Absolute: 0.2 10*3/uL (ref 0.0–0.7)
Eosinophils Relative: 2.5 % (ref 0.0–5.0)
HCT: 46.3 % (ref 39.0–52.0)
Hemoglobin: 15.5 g/dL (ref 13.0–17.0)
Lymphocytes Relative: 20.4 % (ref 12.0–46.0)
Lymphs Abs: 1.8 10*3/uL (ref 0.7–4.0)
MCHC: 33.4 g/dL (ref 30.0–36.0)
MCV: 91.8 fl (ref 78.0–100.0)
Monocytes Absolute: 0.7 10*3/uL (ref 0.1–1.0)
Monocytes Relative: 8.5 % (ref 3.0–12.0)
Neutro Abs: 5.8 10*3/uL (ref 1.4–7.7)
Neutrophils Relative %: 67.9 % (ref 43.0–77.0)
Platelets: 258 10*3/uL (ref 150.0–400.0)
RBC: 5.04 Mil/uL (ref 4.22–5.81)
RDW: 12.6 % (ref 11.5–15.5)
WBC: 8.6 10*3/uL (ref 4.0–10.5)

## 2019-03-19 LAB — BASIC METABOLIC PANEL
BUN: 12 mg/dL (ref 6–23)
CO2: 31 mEq/L (ref 19–32)
Calcium: 9.8 mg/dL (ref 8.4–10.5)
Chloride: 102 mEq/L (ref 96–112)
Creatinine, Ser: 1.11 mg/dL (ref 0.40–1.50)
GFR: 65.95 mL/min (ref >60.00)
Glucose, Bld: 91 mg/dL (ref 70–99)
Potassium: 4.7 mEq/L (ref 3.5–5.1)
Sodium: 139 mEq/L (ref 135–145)

## 2019-03-19 LAB — TSH: TSH: 1.57 u[IU]/mL (ref 0.35–4.50)

## 2019-03-19 NOTE — Telephone Encounter (Signed)
Seen today. 

## 2019-03-19 NOTE — Assessment & Plan Note (Signed)
Newly noted. Not consistent with vertigo. Anticipate dehydration related with contribution of antihypertensives. Encouraged good hydration status. Update carotid US as overdue. Will check labs (CBC, BMP, TSH) and decrease metoprolol to 12.5mg  bid. Update if worsening symptoms otherwise f/u with cards as scheduled 04/2019. Pt agrees with plan.

## 2019-03-19 NOTE — Assessment & Plan Note (Signed)
Update carotid US with new dizziness.

## 2019-03-19 NOTE — Patient Instructions (Addendum)
Your blood pressures did drop with standing - work on increasing water intake.  Labs today. I will order carotid artery ultrasound as you're due.  Decrease metoprolol to 1/2 tablet twice a day (12.5mg  twice daily).  Monitor blood pressures, let us know if consistently >140/90.   Orthostatic Hypotension Blood pressure is a measurement of how strongly, or weakly, your blood is pressing against the walls of your arteries. Orthostatic hypotension is a sudden drop in blood pressure that happens when you quickly change positions, such as when you get up from sitting or lying down. Arteries are blood vessels that carry blood from your heart throughout your body. When blood pressure is too low, you may not get enough blood to your brain or to the rest of your organs. This can cause weakness, light-headedness, rapid heartbeat, and fainting. This can last for just a few seconds or for up to a few minutes. Orthostatic hypotension is usually not a serious problem. However, if it happens frequently or gets worse, it may be a sign of something more serious. What are the causes? This condition may be caused by:  Sudden changes in posture, such as standing up quickly after you have been sitting or lying down.  Blood loss.  Loss of body fluids (dehydration).  Heart problems.  Hormone (endocrine) problems.  Pregnancy.  Severe infection.  Lack of certain nutrients.  Severe allergic reactions (anaphylaxis).  Certain medicines, such as blood pressure medicine or medicines that make the body lose excess fluids (diuretics). Sometimes, this condition can be caused by not taking medicine as directed, such as taking too much of a certain medicine. What increases the risk? The following factors may make you more likely to develop this condition:  Age. Risk increases as you get older.  Conditions that affect the heart or the central nervous system.  Taking certain medicines, such as blood pressure medicine  or diuretics.  Being pregnant. What are the signs or symptoms? Symptoms of this condition may include:  Weakness.  Light-headedness.  Dizziness.  Blurred vision.  Fatigue.  Rapid heartbeat.  Fainting, in severe cases. How is this diagnosed? This condition is diagnosed based on:  Your medical history.  Your symptoms.  Your blood pressure measurement. Your health care provider will check your blood pressure when you are: ? Lying down. ? Sitting. ? Standing. A blood pressure reading is recorded as two numbers, such as "120 over 80" (or 120/80). The first ("top") number is called the systolic pressure. It is a measure of the pressure in your arteries as your heart beats. The second ("bottom") number is called the diastolic pressure. It is a measure of the pressure in your arteries when your heart relaxes between beats. Blood pressure is measured in a unit called mm Hg. Healthy blood pressure for most adults is 120/80. If your blood pressure is below 90/60, you may be diagnosed with hypotension. Other information or tests that may be used to diagnose orthostatic hypotension include:  Your other vital signs, such as your heart rate and temperature.  Blood tests.  Tilt table test. For this test, you will be safely secured to a table that moves you from a lying position to an upright position. Your heart rhythm and blood pressure will be monitored during the test. How is this treated? This condition may be treated by:  Changing your diet. This may involve eating more salt (sodium) or drinking more water.  Taking medicines to raise your blood pressure.  Changing the dosage  of certain medicines you are taking that might be lowering your blood pressure.  Wearing compression stockings. These stockings help to prevent blood clots and reduce swelling in your legs. In some cases, you may need to go to the hospital for:  Fluid replacement. This means you will receive fluids through  an IV.  Blood replacement. This means you will receive donated blood through an IV (transfusion).  Treating an infection or heart problems, if this applies.  Monitoring. You may need to be monitored while medicines that you are taking wear off. Follow these instructions at home: Eating and drinking   Drink enough fluid to keep your urine pale yellow.  Eat a healthy diet, and follow instructions from your health care provider about eating or drinking restrictions. A healthy diet includes: ? Fresh fruits and vegetables. ? Whole grains. ? Lean meats. ? Low-fat dairy products.  Eat extra salt only as directed. Do not add extra salt to your diet unless your health care provider told you to do that.  Eat frequent, small meals.  Avoid standing up suddenly after eating. Medicines  Take over-the-counter and prescription medicines only as told by your health care provider. ? Follow instructions from your health care provider about changing the dosage of your current medicines, if this applies. ? Do not stop or adjust any of your medicines on your own. General instructions   Wear compression stockings as told by your health care provider.  Get up slowly from lying down or sitting positions. This gives your blood pressure a chance to adjust.  Avoid hot showers and excessive heat as directed by your health care provider.  Return to your normal activities as told by your health care provider. Ask your health care provider what activities are safe for you.  Do not use any products that contain nicotine or tobacco, such as cigarettes, e-cigarettes, and chewing tobacco. If you need help quitting, ask your health care provider.  Keep all follow-up visits as told by your health care provider. This is important. Contact a health care provider if you:  Vomit.  Have diarrhea.  Have a fever for more than 2-3 days.  Feel more thirsty than usual.  Feel weak and tired. Get help right away  if you:  Have chest pain.  Have a fast or irregular heartbeat.  Develop numbness in any part of your body.  Cannot move your arms or your legs.  Have trouble speaking.  Become sweaty or feel light-headed.  Faint.  Feel short of breath.  Have trouble staying awake.  Feel confused. Summary  Orthostatic hypotension is a sudden drop in blood pressure that happens when you quickly change positions.  Orthostatic hypotension is usually not a serious problem.  It is diagnosed by having your blood pressure taken lying down, sitting, and then standing.  It may be treated by changing your diet or adjusting your medicines. This information is not intended to replace advice given to you by your health care provider. Make sure you discuss any questions you have with your health care provider. Document Released: 04/01/2002 Document Revised: 10/05/2017 Document Reviewed: 10/05/2017 Elsevier Patient Education  2020 [employer].

## 2019-03-19 NOTE — Progress Notes (Signed)
This visit was conducted in person.  BP 122/70 (BP Location: Left Arm, Patient Position: Sitting, Cuff Size: Normal)   Pulse 69   Temp 98.3 F (36.8 C) (Temporal)   Ht 5\' 11"  (1.803 m)   Wt 183 lb 4 oz (83.1 kg)   SpO2 98%   BMI 25.56 kg/m   Orthostatic VS for the past 24 hrs (Last 3 readings):  BP- Lying BP- Standing at 0 minutes BP- Standing at 3 minutes  03/19/19 1145 150/80 106/70 120/80  03/19/19 1125 - 102/64 -  03/19/19 1123 132/72 - -    CC: dizzness Subjective:    Patient ID: [name] [name], male    DOB: 09-14-1950, 68 y.o.   MRN: [id number]  HPI: [name] [name] is a 68 y.o. male presenting on 03/19/2019 for Dizziness (C/o dizziness that started about 2 days ago.  Feels "swimmy headed" when lying down. )   Yesterday noted dizziness more when laying down. 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 Dizziness described as "swimmy headed", worse with head movements. He hadn't eaten anything that morning so felt better after eating. Some "room spinning" vertigo.   No presyncope, syncope. No headaches, vision changes, unilateral numbness, slurred speech. No chest pain, dyspnea.   "Heart fluttering" for the past month, improved after he stopped taking fish oil. He increased protonix to BID for worsening GERD with benefit.   Known CAD s/p 4 stents, known carotid stenosis. On aspirin and crestor daily. Last carotid US 12/2017 - overdue.      Relevant past medical, surgical, family and social history reviewed and updated as indicated. Interim medical history since our last visit reviewed. Allergies and medications reviewed and updated. Outpatient Medications Prior to Visit  Medication Sig Dispense Refill  . aspirin EC 81 MG tablet Take 1 tablet (81 mg total) by mouth daily. 90 tablet 3  . metoprolol tartrate (LOPRESSOR) 25 MG tablet Take 0.5 tablets (12.5 mg total) by mouth 2 (two) times daily.    . Multiple Vitamin  (MULTIVITAMIN WITH MINERALS) TABS tablet Take 1 tablet by mouth daily.    . nitroGLYCERIN (NITROSTAT) 0.4 MG SL tablet Place 1 tablet (0.4 mg total) under the tongue every 5 (five) minutes as needed for chest pain. 25 tablet 2  . pantoprazole (PROTONIX) 40 MG tablet TAKE 1 TABLET(40 MG) BY MOUTH DAILY 90 tablet 3  . rosuvastatin (CRESTOR) 40 MG tablet TAKE 1 TABLET(40 MG) BY MOUTH DAILY 90 tablet 3  . metoprolol tartrate (LOPRESSOR) 25 MG tablet TAKE 1 TABLET(25 MG) BY MOUTH TWICE DAILY 180 tablet 2   No facility-administered medications prior to visit.      Per HPI unless specifically indicated in ROS section below Review of Systems Objective:    BP 122/70 (BP Location: Left Arm, Patient Position: Sitting, Cuff Size: Normal)   Pulse 69   Temp 98.3 F (36.8 C) (Temporal)   Ht 5\' 11"  (1.803 m)   Wt 183 lb 4 oz (83.1 kg)   SpO2 98%   BMI 25.56 kg/m   Wt Readings from Last 3 Encounters:  03/19/19 183 lb 4 oz (83.1 kg)  05/25/18 183 lb (83 kg)  05/17/18 183 lb (83 kg)    Physical Exam Vitals signs and nursing note reviewed.  Constitutional:      General: He is not in acute distress.    Appearance: Normal appearance. He is not ill-appearing.  HENT:     Head: Normocephalic  and atraumatic.     Mouth/Throat:     Mouth: Mucous membranes are moist.     Pharynx: Oropharynx is clear. No posterior oropharyngeal erythema.  Eyes:     Extraocular Movements: Extraocular movements intact.     Conjunctiva/sclera: Conjunctivae normal.     Pupils: Pupils are equal, round, and reactive to light.  Neck:     Musculoskeletal: Normal range of motion and neck supple.     Vascular: Carotid bruit (R>L) present.  Cardiovascular:     Rate and Rhythm: Normal rate and regular rhythm.     Pulses: Normal pulses.     Heart sounds: Normal heart sounds. No murmur.  Pulmonary:     Effort: Pulmonary effort is normal. No respiratory distress.     Breath sounds: Normal breath sounds. No wheezing, rhonchi or  rales.  Lymphadenopathy:     Cervical: No cervical adenopathy.  Neurological:     Mental Status: He is alert.     Comments:  CN 2-12 intact FTN intact Dix hallpike negative bilaterally, did feel lightheaded when sitting up  Psychiatric:        Mood and Affect: Mood normal.        Behavior: Behavior normal.       Results for orders placed or performed in visit on 05/17/18  Lipid panel  Result Value Ref Range   Cholesterol, Total 128 100 - 199 mg/dL   Triglycerides 96 0 - 149 mg/dL   HDL 45 >16>39 mg/dL   VLDL Cholesterol Cal 19 5 - 40 mg/dL   LDL Calculated 64 0 - 99 mg/dL   Chol/HDL Ratio 2.8 0.0 - 5.0 ratio  Hepatic function panel  Result Value Ref Range   Total Protein 6.9 6.0 - 8.5 g/dL   Albumin 4.5 3.8 - 4.8 g/dL   Bilirubin Total 0.6 0.0 - 1.2 mg/dL   Bilirubin, Direct 1.090.18 0.00 - 0.40 mg/dL   Alkaline Phosphatase 67 39 - 117 IU/L   AST 22 0 - 40 IU/L   ALT 13 0 - 44 IU/L  Basic metabolic panel  Result Value Ref Range   Glucose 97 65 - 99 mg/dL   BUN 16 8 - 27 mg/dL   Creatinine, Ser 6.041.17 0.76 - 1.27 mg/dL   GFR calc non Af Amer 65 >59 mL/min/1.73   GFR calc Af Amer 75 >59 mL/min/1.73   BUN/Creatinine Ratio 14 10 - 24   Sodium 139 134 - 144 mmol/L   Potassium 5.4 (H) 3.5 - 5.2 mmol/L   Chloride 101 96 - 106 mmol/L   CO2 24 20 - 29 mmol/L   Calcium 9.3 8.6 - 10.2 mg/dL  Myocardial Perfusion Imaging  Result Value Ref Range   Rest HR 61 bpm   Rest BP 142/75 mmHg   RPE 17    Exercise duration (sec) 31 sec   Percent HR 99 %   Exercise duration (min) 6 min   Estimated workload 7.7 METS   Peak HR 153 bpm   Peak BP 203/70 mmHg   MPHR 154 bpm   SSS 6    SRS 2    SDS 4    TID 0.93    LV sys vol 40 mL   LV dias vol 106 62 - 150 mL   Assessment & Plan:  This visit occurred during the SARS-CoV-2 public health emergency.  Safety protocols were in place, including screening questions prior to the visit, additional usage of staff PPE, and extensive cleaning of  exam  room while observing appropriate contact time as indicated for disinfecting solutions.   Problem List Items Addressed This Visit    Orthostatic dizziness - Primary    Newly noted. Not consistent with vertigo. Anticipate dehydration related with contribution of antihypertensives. Encouraged good hydration status. Update carotid US as overdue. Will check labs (CBC, BMP, TSH) and decrease metoprolol to 12.5mg  bid. Update if worsening symptoms otherwise f/u with cards as scheduled 04/2019. Pt agrees with plan.       Relevant Orders   Basic metabolic panel   CBC with Differential   TSH   Carotid artery disease (HCC)    Update carotid US with new dizziness.      Relevant Medications   metoprolol tartrate (LOPRESSOR) 25 MG tablet   Other Relevant Orders   VAS US CAROTID       No orders of the defined types were placed in this encounter.  Orders Placed This Encounter  Procedures  . Basic metabolic panel  . CBC with Differential  . TSH    Patient instructions: Your blood pressures did drop with standing - work on increasing water intake.  Labs today. I will order carotid artery ultrasound as you're due.  Decrease metoprolol to 1/2 tablet twice a day (12.5mg  twice daily).  Monitor blood pressures, let us know if consistently >140/90.   Follow up plan: No follow-ups on file.  Eustaquio Boyden, MD

## 2019-03-19 NOTE — Telephone Encounter (Signed)
On 03/17/19 started with dizziness on and off; when bends over or gets up from laying down position; room spins around.  Had sharp pain in rt ear for few seconds only.  Had heart flutter 2 days ago. Pt has no covid symptoms, no travel and no known exposure to + covid. Pt s wife will drive pt to office has in office appt today at 11:15 with Dr Darnell Level.

## 2019-03-20 ENCOUNTER — Ambulatory Visit (HOSPITAL_COMMUNITY)
Admission: RE | Admit: 2019-03-20 | Discharge: 2019-03-20 | Disposition: A | Discharge status: Home / Self Care | Payer: PPO | Source: Ambulatory Visit | Attending: Cardiovascular Disease | Admitting: Cardiovascular Disease

## 2019-03-20 ENCOUNTER — Other Ambulatory Visit: Payer: Self-pay

## 2019-03-20 DIAGNOSIS — I6523 Occlusion and stenosis of bilateral carotid arteries: Secondary | ICD-10-CM | POA: Diagnosis not present

## 2019-03-20 NOTE — Telephone Encounter (Signed)
Noted. Thanks.

## 2019-03-20 NOTE — Telephone Encounter (Signed)
Spoke with pt's wife, Pamala Hurry, asking about pt's sxs. States he's has dizziness when turning head to right and when sitting up from lying position or when he straightens up from bending over.  Says they thought pt was going to see cards after [place] but he did not.  I relayed Dr. Synthia Innocent message.  They verbalize understanding and express their thanks.

## 2019-03-20 NOTE — Telephone Encounter (Signed)
Returned pt's wife's, Pamala Hurry (on dpr), call.  States pt will be seen today at 2:00 [facility].  Fyi to Dr. Darnell Level.

## 2019-03-20 NOTE — Telephone Encounter (Addendum)
I misunderstood and thought he had cardiology appointment scheduled for today, not just carotid US scheduled.  plz call patient to get details on latest symptoms. If standing BP remains low, rec hold metoprolol fully.  If worsening symptoms over weekend, recommend UCC eval.  If no better with holding metoprolol, will recommend sooner cards eval - have them call us with an update next week.  Preliminary carotid US report is reassuring with mild plaque buildup in both carotid arteries, no worsening from prior.

## 2019-03-20 NOTE — Telephone Encounter (Signed)
Pt's wife called and said she needs for you to call her back. He had 2 more episodes for dizziness and they scheduled him an appt for 12/11 with [facility]. I gave her the numbers she could call to get him a sooner appointment with [facility] and the [place] office. She is really worried about her husband and said this is not like him.   CB [phone]

## 2019-03-22 ENCOUNTER — Emergency Department
Admission: EM | Admit: 2019-03-22 | Discharge: 2019-03-22 | Disposition: A | Discharge status: Home / Self Care | Payer: PPO | Attending: Emergency Medicine | Admitting: Emergency Medicine

## 2019-03-22 ENCOUNTER — Other Ambulatory Visit: Payer: Self-pay

## 2019-03-22 DIAGNOSIS — J449 Chronic obstructive pulmonary disease, unspecified: Secondary | ICD-10-CM | POA: Diagnosis not present

## 2019-03-22 DIAGNOSIS — Z79899 Other long term (current) drug therapy: Secondary | ICD-10-CM | POA: Insufficient documentation

## 2019-03-22 DIAGNOSIS — R42 Dizziness and giddiness: Secondary | ICD-10-CM | POA: Diagnosis not present

## 2019-03-22 DIAGNOSIS — Z7982 Long term (current) use of aspirin: Secondary | ICD-10-CM | POA: Insufficient documentation

## 2019-03-22 DIAGNOSIS — I252 Old myocardial infarction: Secondary | ICD-10-CM | POA: Insufficient documentation

## 2019-03-22 DIAGNOSIS — I2510 Atherosclerotic heart disease of native coronary artery without angina pectoris: Secondary | ICD-10-CM | POA: Insufficient documentation

## 2019-03-22 DIAGNOSIS — F17220 Nicotine dependence, chewing tobacco, uncomplicated: Secondary | ICD-10-CM | POA: Diagnosis not present

## 2019-03-22 LAB — CBC
HCT: 43.4 % (ref 39.0–52.0)
Hemoglobin: 15 g/dL (ref 13.0–17.0)
MCH: 30.9 pg (ref 26.0–34.0)
MCHC: 34.6 g/dL (ref 30.0–36.0)
MCV: 89.5 fL (ref 80.0–100.0)
Platelets: 229 10*3/uL (ref 150–400)
RBC: 4.85 MIL/uL (ref 4.22–5.81)
RDW: 12.1 % (ref 11.5–15.5)
WBC: 8.1 10*3/uL (ref 4.0–10.5)
nRBC: 0 % (ref 0.0–0.2)

## 2019-03-22 LAB — BASIC METABOLIC PANEL
Anion gap: 7 (ref 5–15)
BUN: 16 mg/dL (ref 8–23)
CO2: 28 mmol/L (ref 22–32)
Calcium: 9.1 mg/dL (ref 8.9–10.3)
Chloride: 106 mmol/L (ref 98–111)
Creatinine, Ser: 1.08 mg/dL (ref 0.61–1.24)
GFR calc Af Amer: >60 mL/min (ref >60)
GFR calc non Af Amer: >60 mL/min (ref >60)
Glucose, Bld: 111 mg/dL — ABNORMAL HIGH (ref 70–99)
Potassium: 4.4 mmol/L (ref 3.5–5.1)
Sodium: 141 mmol/L (ref 135–145)

## 2019-03-22 LAB — URINALYSIS, COMPLETE (UACMP) WITH MICROSCOPIC
Bacteria, UA: NONE SEEN
Bilirubin Urine: NEGATIVE
Glucose, UA: NEGATIVE mg/dL
Hgb urine dipstick: NEGATIVE
Ketones, ur: NEGATIVE mg/dL
Leukocytes,Ua: NEGATIVE
Nitrite: NEGATIVE
Protein, ur: NEGATIVE mg/dL
Specific Gravity, Urine: 1.015 (ref 1.005–1.030)
Squamous Epithelial / LPF: NONE SEEN (ref 0–5)
pH: 5 (ref 5.0–8.0)

## 2019-03-22 MED ORDER — SODIUM CHLORIDE 0.9% FLUSH: 3.0000 mL | Freq: Once | INTRAVENOUS | Status: DC

## 2019-03-22 MED ORDER — MECLIZINE HCL 25 MG PO TABS: 25.0000 mg | ORAL_TABLET | Freq: Three times a day (TID) | ORAL | 0 refills | Status: DC | PRN

## 2019-03-22 NOTE — ED Triage Notes (Signed)
Pt c/o dizziness with any movement, states worse with standing since last weekend, states he saw his PCP on tuesday and had his carotids checked Wednesday and states he has unchanged blockages. Pt is a/ox4 at present. Pt has a steady gait.

## 2019-03-22 NOTE — ED Notes (Signed)
EDP at bedside  

## 2019-03-22 NOTE — ED Provider Notes (Signed)
[facility] Emergency Department Provider Note  ____________________________________________   I have reviewed the triage vital signs and the nursing notes.   HISTORY  Chief Complaint Dizziness   History limited by: Not Limited   HPI [name] [name] is a 68 y.o. male who presents to the emergency department today because of concern for dizziness. The symptoms started roughly 1 week ago. He says that it is the sensation of the room spinning around and some lightheadedness. The symptoms occur when the patient rolls over in bed or stands up from bending over. They only last for a few seconds. He denies any recent illness or fever. Has not had any chest pain. The patient did see his PCP doctor who ordered a [place] of his carotids.  Records reviewed. Per medical record review patient has a history of HLD, COPD, CAD.   Past Medical History:  Diagnosis Date  . BARRETTS ESOPHAGUS   . Carotid artery occlusion   . COLONIC POLYPS, HX OF   . COPD   . CORONARY ARTERY DISEASE    a. 2007: cath showing 95% stenosis proximal LAD (BMS placed), 40% D1, and 75% RCA stenosis. b. NST 08/2008: no evidence of ischemia c. NST 04/2012: no evidence of ischemia.  Marland Kitchen GERD   . GLAUCOMA   . HYPERCHOLESTEROLEMIA   . HYPERLIPIDEMIA   . NSTEMI (non-ST elevated myocardial infarction) ([place]) 02/25/2016  . Other constipation   . OTITIS EXTERNA   . Peripheral vascular disease ([place])   . Thoracic aorta atherosclerosis [facility])     Patient Active Problem List   Diagnosis Date Noted  . Orthostatic dizziness 03/19/2019  . Testicle pain 10/30/2017  . Thoracic aorta atherosclerosis ([place])   . Preventative health care 10/17/2016  . Hemoptysis 10/17/2016  . Carotid artery disease ([place]) 03/14/2012  . Cerebrovascular disease 01/27/2011  . Hypertension 01/27/2011  . CAROTID BRUIT 01/26/2010  . TOBACCO ABUSE 08/27/2008  . Hyperlipidemia LDL goal <70 04/15/2007  . HYPERCHOLESTEROLEMIA 04/11/2007  .  GLAUCOMA 04/11/2007  . CAD S/P percutaneous coronary angioplasty 04/11/2007  . COPD (chronic obstructive pulmonary disease) ([place]) 04/11/2007  . GERD 04/11/2007  . BARRETTS ESOPHAGUS 04/11/2007  . COLONIC POLYPS, HX OF 04/11/2007    Past Surgical History:  Procedure Laterality Date  . CARDIAC CATHETERIZATION N/A 02/26/2016   Procedure: Left Heart Cath and Coronary Angiography;  Surgeon: Wellington Hampshire, MD;  Location: [place] [facility];  Service: Cardiovascular;  Laterality: N/A;  . CARDIAC CATHETERIZATION N/A 02/26/2016   Procedure: Coronary Stent Intervention;  Surgeon: Wellington Hampshire, MD;  Location: [place] [facility];  Service: Cardiovascular;  Laterality: N/A;  . CORONARY STENT PLACEMENT    . ESOPHAGOGASTRODUODENOSCOPY    . UMBILICAL HERNIA REPAIR      Prior to Admission medications   Medication Sig Start Date End Date Taking? Authorizing Provider  aspirin EC 81 MG tablet Take 1 tablet (81 mg total) by mouth daily. 02/24/16   Strader, Fransisco Hertz, PA-C  metoprolol tartrate (LOPRESSOR) 25 MG tablet Take 0.5 tablets (12.5 mg total) by mouth 2 (two) times daily. 03/19/19   Ria Bush, MD  Multiple Vitamin (MULTIVITAMIN WITH MINERALS) TABS tablet Take 1 tablet by mouth daily.    [provider]  nitroGLYCERIN (NITROSTAT) 0.4 MG SL tablet Place 1 tablet (0.4 mg total) under the tongue every 5 (five) minutes as needed for chest pain. 03/31/14   Lyda Jester M, PA-C  pantoprazole (PROTONIX) 40 MG tablet TAKE 1 TABLET(40 MG) BY MOUTH DAILY  06/26/18   Lewayne Bunting, MD  rosuvastatin (CRESTOR) 40 MG tablet TAKE 1 TABLET(40 MG) BY MOUTH DAILY 07/04/18   Lewayne Bunting, MD    Allergies Patient has no known allergies.  Family History  Problem Relation Age of Onset  . Heart disease Father 67       Before age 68- Open Heart surgery  . Hyperlipidemia Mother     Social History Social History   Tobacco Use  . Smoking status: Former Smoker    Packs/day: 0.10     Years: 47.00    Pack years: 4.70    Types: Cigarettes    Quit date: 09/24/2013    Years since quitting: 5.4  . Smokeless tobacco: Current User    Types: Chew  Substance Use Topics  . Alcohol use: Yes    Alcohol/week: 2.0 standard drinks    Types: 2 Cans of beer per week    Comment: occ  . Drug use: No    Review of Systems Constitutional: No fever/chills Eyes: No visual changes. ENT: No sore throat. Cardiovascular: Denies chest pain. Respiratory: Denies shortness of breath. Gastrointestinal: No abdominal pain.  No nausea, no vomiting.  No diarrhea.   Genitourinary: Negative for dysuria. Musculoskeletal: Negative for back pain. Skin: Negative for rash. Neurological: Positive for dizziness.  ____________________________________________   PHYSICAL EXAM:  VITAL SIGNS: ED Triage Vitals  Enc Vitals Group     BP 03/22/19 0952 (!) 144/84     Pulse Rate 03/22/19 0952 73     Resp 03/22/19 0952 18     Temp 03/22/19 0952 97.9 F (36.6 C)     Temp Source 03/22/19 0952 Oral     SpO2 03/22/19 0952 100 %     Weight 03/22/19 0953 184 lb (83.5 kg)     Height 03/22/19 0953 5\' 11"  (1.803 m)     Head Circumference --      Peak Flow --      Pain Score 03/22/19 0953 0   Constitutional: Alert and oriented.  Eyes: Conjunctivae are normal.  ENT      Head: Normocephalic and atraumatic.      Nose: No congestion/rhinnorhea.      Mouth/Throat: Mucous membranes are moist.      Neck: No stridor. Hematological/Lymphatic/Immunilogical: No cervical lymphadenopathy. Cardiovascular: Normal rate, regular rhythm.  No murmurs, rubs, or gallops.  Respiratory: Normal respiratory effort without tachypnea nor retractions. Breath sounds are clear and equal bilaterally. No wheezes/rales/rhonchi. Gastrointestinal: Soft and non tender. No rebound. No guarding.  Genitourinary: Deferred Musculoskeletal: Normal range of motion in all extremities. No lower extremity edema. Neurologic:  Normal speech and  language. No gross focal neurologic deficits are appreciated. Dix Hallpike positive bilaterally. Worse with head leaned to the right Skin:  Skin is warm, dry and intact. No rash noted. Psychiatric: Mood and affect are normal. Speech and behavior are normal. Patient exhibits appropriate insight and judgment.  ____________________________________________    LABS (pertinent positives/negatives)  BMP wnl except glu 111 CBC wbc 8.1, hgb 15.0, plt 229 UA clear, not concerning for infection  ____________________________________________   EKG  I11/29/20, attending physician, personally viewed and interpreted this EKG  EKG Time: 0958 Rate: 59 Rhythm: sinus bradycardia Axis: normal Intervals: qtc 411 QRS: narrow ST changes: no st elevation Impression: normal ekg ____________________________________________    RADIOLOGY  None  ____________________________________________   PROCEDURES  Procedures  ____________________________________________   INITIAL IMPRESSION / ASSESSMENT AND PLAN / ED COURSE  Pertinent labs & imaging  results that were available during my care of the patient were reviewed by me and considered in my medical decision making (see chart for details).   Patient presents to the emergency department today with symptoms classic for BPPV. Doubt central lesion given brief episodes of symptoms, and symptoms being elicited by head movement. Did perform epley maneuver. Discussed vertigo with patient. Will give prescription for meclizine and ENT follow up.   ____________________________________________   FINAL CLINICAL IMPRESSION(S) / ED DIAGNOSES  Final diagnoses:  Vertigo     Note: This dictation was prepared with Dragon dictation. Any transcriptional errors that result from this process are unintentional     Phineas SemenGoodman, Jamaira Sherk, MD 03/22/19 1354

## 2019-03-22 NOTE — Discharge Instructions (Addendum)
Please seek medical attention for any high fevers, chest pain, shortness of breath, change in behavior, persistent vomiting, bloody stool or any other new or concerning symptoms.  

## 2019-04-03 DIAGNOSIS — R42 Dizziness and giddiness: Secondary | ICD-10-CM | POA: Diagnosis not present

## 2019-04-05 ENCOUNTER — Encounter (HOSPITAL_COMMUNITY): Payer: PPO

## 2019-05-17 NOTE — Progress Notes (Signed)
HPI: FU coronary disease. He underwent cardiac catheterization in 2007 and had a bare-metal stent to his LAD.Patient had repeat catheterization November 2017 and was found to have an 80% posterior lateral,significant RCA disease and ejection fraction 55-65%. Patient had 3 drug-eluting stents placed in the right coronary artery at that time.Abdominal ultrasound May 2018 showed no aneurysm. Nuclear study January 2020 showed ejection fraction 62% with no ischemia or infarction.  Also with cerebrovascular disease followed by vascular surgery.Since last seen, the patient denies any dyspnea on exertion, orthopnea, PND, pedal edema, palpitations, syncope or chest pain.   Current Outpatient Medications  Medication Sig Dispense Refill  . aspirin EC 81 MG tablet Take 1 tablet (81 mg total) by mouth daily. 90 tablet 3  . meclizine (ANTIVERT) 25 MG tablet Take 1 tablet (25 mg total) by mouth 3 (three) times daily as needed for dizziness. 30 tablet 0  . metoprolol tartrate (LOPRESSOR) 25 MG tablet Take 0.5 tablets (12.5 mg total) by mouth 2 (two) times daily. NEED OV FOR FUTURE REFILL. 180 tablet 1  . Multiple Vitamin (MULTIVITAMIN WITH MINERALS) TABS tablet Take 1 tablet by mouth daily.    . nitroGLYCERIN (NITROSTAT) 0.4 MG SL tablet Place 1 tablet (0.4 mg total) under the tongue every 5 (five) minutes as needed for chest pain. 25 tablet 2  . pantoprazole (PROTONIX) 40 MG tablet TAKE 1 TABLET(40 MG) BY MOUTH DAILY 90 tablet 3  . rosuvastatin (CRESTOR) 40 MG tablet TAKE 1 TABLET(40 MG) BY MOUTH DAILY 90 tablet 3   No current facility-administered medications for this visit.     Past Medical History:  Diagnosis Date  . BARRETTS ESOPHAGUS   . Carotid artery occlusion   . COLONIC POLYPS, HX OF   . COPD   . CORONARY ARTERY DISEASE    a. 2007: cath showing 95% stenosis proximal LAD (BMS placed), 40% D1, and 75% RCA stenosis. b. NST 08/2008: no evidence of ischemia c. NST 04/2012: no evidence of  ischemia.  Marland Kitchen GERD   . GLAUCOMA   . HYPERCHOLESTEROLEMIA   . HYPERLIPIDEMIA   . NSTEMI (non-ST elevated myocardial infarction) ([facility]) 02/25/2016  . Other constipation   . OTITIS EXTERNA   . Peripheral vascular disease ([facility])   . Thoracic aorta atherosclerosis [facility])     Past Surgical History:  Procedure Laterality Date  . CARDIAC CATHETERIZATION N/A 02/26/2016   Procedure: Left Heart Cath and Coronary Angiography;  Surgeon: Iran Ouch, MD;  Location: [facility];  Service: Cardiovascular;  Laterality: N/A;  . CARDIAC CATHETERIZATION N/A 02/26/2016   Procedure: Coronary Stent Intervention;  Surgeon: Iran Ouch, MD;  Location: [facility];  Service: Cardiovascular;  Laterality: N/A;  . CORONARY STENT PLACEMENT    . ESOPHAGOGASTRODUODENOSCOPY    . UMBILICAL HERNIA REPAIR      Social History   Socioeconomic History  . Marital status: Married    Spouse name: Not on file  . Number of children: Not on file  . Years of education: Not on file  . Highest education level: Not on file  Occupational History  . Not on file  Tobacco Use  . Smoking status: Former Smoker    Packs/day: 0.10    Years: 47.00    Pack years: 4.70    Types: Cigarettes    Quit date: 09/24/2013    Years since quitting: 5.6  . Smokeless tobacco: Current User    Types: Chew  Substance and Sexual Activity  .  Alcohol use: Yes    Alcohol/week: 2.0 standard drinks    Types: 2 Cans of beer per week    Comment: occ  . Drug use: No  . Sexual activity: Not on file  Other Topics Concern  . Not on file  Social History Narrative   No living will    Would want wife, then son, would be health care POA   Would accept resuscitation attempts--but no prolonged artificial ventilation   Not sure about tube feeds   Social Determinants of Health   Financial Resource Strain:   . Difficulty of Paying Living Expenses: Not on file  Food Insecurity:   . Worried About Charity fundraiser in the Last  Year: Not on file  . Ran Out of Food in the Last Year: Not on file  Transportation Needs:   . Lack of Transportation (Medical): Not on file  . Lack of Transportation (Non-Medical): Not on file  Physical Activity:   . Days of Exercise per Week: Not on file  . Minutes of Exercise per Session: Not on file  Stress:   . Feeling of Stress : Not on file  Social Connections:   . Frequency of Communication with Friends and Family: Not on file  . Frequency of Social Gatherings with Friends and Family: Not on file  . Attends Religious Services: Not on file  . Active Member of Clubs or Organizations: Not on file  . Attends Archivist Meetings: Not on file  . Marital Status: Not on file  Intimate Partner Violence:   . Fear of Current or Ex-Partner: Not on file  . Emotionally Abused: Not on file  . Physically Abused: Not on file  . Sexually Abused: Not on file    Family History  Problem Relation Age of Onset  . Heart disease Father 54       Before age 88- Open Heart surgery  . Hyperlipidemia Mother     ROS: no fevers or chills, productive cough, hemoptysis, dysphasia, odynophagia, melena, hematochezia, dysuria, hematuria, rash, seizure activity, orthopnea, PND, pedal edema, claudication. Remaining systems are negative.  Physical Exam: Well-developed well-nourished in no acute distress.  Skin is warm and dry.  HEENT is normal.  Neck is supple.  Chest is clear to auscultation with normal expansion.  Cardiovascular exam is regular rate and rhythm.  Abdominal exam nontender or distended. No masses palpated. Extremities show no edema. neuro grossly intact   A/P  1 coronary artery disease-patient has not had recurrent chest pain.  Continue aspirin and statin.  2 hypertension-blood pressure controlled.  Continue present medical regimen.  Check potassium and renal function.  3 hyperlipidemia-continue statin.  Check lipids and liver.  4 carotid artery disease-continue medical  therapy with aspirin and statin.  Followed by vascular surgery.  5 previous chest discomfort-this is felt secondary to indigestion.  Previous nuclear study showed no ischemia.  Kirk Ruths, MD

## 2019-05-20 ENCOUNTER — Other Ambulatory Visit: Payer: Self-pay

## 2019-05-20 MED ORDER — METOPROLOL TARTRATE 25 MG PO TABS: 12.5000 mg | ORAL_TABLET | Freq: Two times a day (BID) | ORAL | 1 refills | Status: DC

## 2019-05-21 ENCOUNTER — Encounter: Payer: Self-pay | Admitting: Cardiology

## 2019-05-21 ENCOUNTER — Other Ambulatory Visit: Payer: Self-pay

## 2019-05-21 ENCOUNTER — Ambulatory Visit: Payer: PPO | Admitting: Cardiology

## 2019-05-21 VITALS — BP 122/72 | HR 70 | Ht 71.0 in | Wt 188.0 lb

## 2019-05-21 DIAGNOSIS — E785 Hyperlipidemia, unspecified: Secondary | ICD-10-CM

## 2019-05-21 DIAGNOSIS — Z9861 Coronary angioplasty status: Secondary | ICD-10-CM

## 2019-05-21 DIAGNOSIS — I10 Essential (primary) hypertension: Secondary | ICD-10-CM

## 2019-05-21 DIAGNOSIS — I2510 Atherosclerotic heart disease of native coronary artery without angina pectoris: Secondary | ICD-10-CM

## 2019-05-21 NOTE — Patient Instructions (Signed)
Medication Instructions:  NO CHANGE *If you need a refill on your cardiac medications before your next appointment, please call your pharmacy*  Lab Work: Your physician recommends that you return for lab work PRIOR TO EATING  If you have labs (blood work) drawn today and your tests are completely normal, you will receive your results only by: . MyChart Message (if you have MyChart) OR . A paper copy in the mail If you have any lab test that is abnormal or we need to change your treatment, we will call you to review the results.  Follow-Up: At [facility], you and your health needs are our priority.  As part of our continuing mission to provide you with exceptional heart care, we have created designated Provider Care Teams.  These Care Teams include your primary Cardiologist (physician) and Advanced Practice Providers (APPs -  Physician Assistants and Nurse Practitioners) who all work together to provide you with the care you need, when you need it.  Your next appointment:   12 month(s)  The format for your next appointment:   Either In Person or Virtual  Provider:   You may see BRIAN CRENSHAW MD or one of the following Advanced Practice Providers on your designated Care Team:    Luke Kilroy, PA-C  Callie Goodrich, PA-C  Jesse Cleaver, FNP    

## 2019-05-27 DIAGNOSIS — I2510 Atherosclerotic heart disease of native coronary artery without angina pectoris: Secondary | ICD-10-CM | POA: Diagnosis not present

## 2019-05-27 DIAGNOSIS — Z9861 Coronary angioplasty status: Secondary | ICD-10-CM | POA: Diagnosis not present

## 2019-05-27 DIAGNOSIS — E785 Hyperlipidemia, unspecified: Secondary | ICD-10-CM | POA: Diagnosis not present

## 2019-05-28 ENCOUNTER — Encounter: Payer: Self-pay | Admitting: *Deleted

## 2019-05-28 LAB — LIPID PANEL
Chol/HDL Ratio: 1.8 ratio (ref 0.0–5.0)
Cholesterol, Total: 135 mg/dL (ref 100–199)
HDL: 76 mg/dL (ref >39)
LDL Chol Calc (NIH): 36 mg/dL (ref 0–99)
Triglycerides: 134 mg/dL (ref 0–149)
VLDL Cholesterol Cal: 23 mg/dL (ref 5–40)

## 2019-05-28 LAB — COMPREHENSIVE METABOLIC PANEL
ALT: 16 IU/L (ref 0–44)
AST: 27 IU/L (ref 0–40)
Albumin/Globulin Ratio: 2.2 (ref 1.2–2.2)
Albumin: 4.7 g/dL (ref 3.8–4.8)
Alkaline Phosphatase: 76 IU/L (ref 39–117)
BUN/Creatinine Ratio: 13 (ref 10–24)
BUN: 15 mg/dL (ref 8–27)
Bilirubin Total: 0.8 mg/dL (ref 0.0–1.2)
CO2: 25 mmol/L (ref 20–29)
Calcium: 9.2 mg/dL (ref 8.6–10.2)
Chloride: 102 mmol/L (ref 96–106)
Creatinine, Ser: 1.19 mg/dL (ref 0.76–1.27)
GFR calc Af Amer: 73 mL/min/{1.73_m2} (ref >59)
GFR calc non Af Amer: 63 mL/min/{1.73_m2} (ref >59)
Globulin, Total: 2.1 g/dL (ref 1.5–4.5)
Glucose: 94 mg/dL (ref 65–99)
Potassium: 4.6 mmol/L (ref 3.5–5.2)
Sodium: 140 mmol/L (ref 134–144)
Total Protein: 6.8 g/dL (ref 6.0–8.5)

## 2019-06-25 ENCOUNTER — Other Ambulatory Visit: Payer: Self-pay

## 2019-06-25 MED ORDER — PANTOPRAZOLE SODIUM 40 MG PO TBEC: DELAYED_RELEASE_TABLET | ORAL | 3 refills | Status: DC

## 2019-07-02 ENCOUNTER — Other Ambulatory Visit: Payer: Self-pay

## 2019-07-02 DIAGNOSIS — I2510 Atherosclerotic heart disease of native coronary artery without angina pectoris: Secondary | ICD-10-CM

## 2019-07-02 MED ORDER — ROSUVASTATIN CALCIUM 40 MG PO TABS: ORAL_TABLET | ORAL | 3 refills | Status: DC

## 2019-08-07 DIAGNOSIS — H401113 Primary open-angle glaucoma, right eye, severe stage: Secondary | ICD-10-CM | POA: Diagnosis not present

## 2019-08-22 DIAGNOSIS — T1500XA Foreign body in cornea, unspecified eye, initial encounter: Secondary | ICD-10-CM | POA: Diagnosis not present

## 2019-09-02 ENCOUNTER — Ambulatory Visit (INDEPENDENT_AMBULATORY_CARE_PROVIDER_SITE_OTHER): Payer: PPO | Admitting: Internal Medicine

## 2019-09-02 ENCOUNTER — Other Ambulatory Visit: Payer: Self-pay

## 2019-09-02 ENCOUNTER — Encounter: Payer: Self-pay | Admitting: Internal Medicine

## 2019-09-02 DIAGNOSIS — M79602 Pain in left arm: Secondary | ICD-10-CM | POA: Diagnosis not present

## 2019-09-02 NOTE — Assessment & Plan Note (Signed)
Shoulder is okay--except for some crepitus May have muscle tear--which should resolve on its own eventually Discussed heat/topical diclofenac Ibuprofen 600mg  at bedtime for the next couple of nights only

## 2019-09-02 NOTE — Progress Notes (Signed)
Subjective:    Patient ID: [name] [name], male    DOB: [birth year], 69 y.o.   MRN: [id number]  HPI Here due to left arm pain This visit occurred during the SARS-CoV-2 public health emergency.  Safety protocols were in place, including screening questions prior to the visit, additional usage of staff PPE, and extensive cleaning of exam room while observing appropriate contact time as indicated for disinfecting solutions.   Was picking up a bed of a truck Didn't think he injured himself Noticed pain that night--and has been up the last 4 nights Throbbing along lateral upper left arm Tried a strap--may have helped briefly  Tried ibuprofen 400mg  and 200mg  two other times May have helped slightly Tried some tylenol--also not much help advil 400mg  also once--may have helped (or his rest may have helped) Still mowed grass (rider) yesterday  Current Outpatient Medications on File Prior to Visit  Medication Sig Dispense Refill  . aspirin EC 81 MG tablet Take 1 tablet (81 mg total) by mouth daily. 90 tablet 3  . metoprolol tartrate (LOPRESSOR) 25 MG tablet Take 0.5 tablets (12.5 mg total) by mouth 2 (two) times daily. NEED OV FOR FUTURE REFILL. 180 tablet 1  . Multiple Vitamin (MULTIVITAMIN WITH MINERALS) TABS tablet Take 1 tablet by mouth daily.    . nitroGLYCERIN (NITROSTAT) 0.4 MG SL tablet Place 1 tablet (0.4 mg total) under the tongue every 5 (five) minutes as needed for chest pain. 25 tablet 2  . pantoprazole (PROTONIX) 40 MG tablet TAKE 1 TABLET(40 MG) BY MOUTH DAILY 90 tablet 3  . rosuvastatin (CRESTOR) 40 MG tablet TAKE 1 TABLET(40 MG) BY MOUTH DAILY 90 tablet 3   No current facility-administered medications on file prior to visit.    No Known Allergies  Past Medical History:  Diagnosis Date  . BARRETTS ESOPHAGUS   . Carotid artery occlusion   . COLONIC POLYPS, HX OF   . COPD   . CORONARY ARTERY DISEASE    a. 2007: cath showing 95% stenosis proximal LAD (BMS placed), 40% D1,  and 75% RCA stenosis. b. NST 08/2008: no evidence of ischemia c. NST 04/2012: no evidence of ischemia.  2008 GERD   . GLAUCOMA   . HYPERCHOLESTEROLEMIA   . HYPERLIPIDEMIA   . NSTEMI (non-ST elevated myocardial infarction) ([facility]) 02/25/2016  . Other constipation   . OTITIS EXTERNA   . Peripheral vascular disease ([facility])   . Thoracic aorta atherosclerosis [facility])     Past Surgical History:  Procedure Laterality Date  . CARDIAC CATHETERIZATION N/A 02/26/2016   Procedure: Left Heart Cath and Coronary Angiography;  Surgeon: 13/05/2015, MD;  Location: [facility];  Service: Cardiovascular;  Laterality: N/A;  . CARDIAC CATHETERIZATION N/A 02/26/2016   Procedure: Coronary Stent Intervention;  Surgeon: 13/06/2015, MD;  Location: [facility];  Service: Cardiovascular;  Laterality: N/A;  . CORONARY STENT PLACEMENT    . ESOPHAGOGASTRODUODENOSCOPY    . UMBILICAL HERNIA REPAIR      Family History  Problem Relation Age of Onset  . Heart disease Father 45       Before age 30- Open Heart surgery  . Hyperlipidemia Mother     Social History   Socioeconomic History  . Marital status: Married    Spouse name: Not on file  . Number of children: Not on file  . Years of education: Not on file  . Highest education level: Not on file  Occupational History  . Not  on file  Tobacco Use  . Smoking status: Former Smoker    Packs/day: 0.10    Years: 47.00    Pack years: 4.70    Types: Cigarettes    Quit date: 09/24/2013    Years since quitting: 5.9  . Smokeless tobacco: Current User    Types: Chew  Substance and Sexual Activity  . Alcohol use: Yes    Alcohol/week: 2.0 standard drinks    Types: 2 Cans of beer per week    Comment: occ  . Drug use: No  . Sexual activity: Not on file  Other Topics Concern  . Not on file  Social History Narrative   No living will    Would want wife, then son, would be health care POA   Would accept resuscitation attempts--but no prolonged  artificial ventilation   Not sure about tube feeds   Social Determinants of Health   Financial Resource Strain:   . Difficulty of Paying Living Expenses:   Food Insecurity:   . Worried About Charity fundraiser in the Last Year:   . Arboriculturist in the Last Year:   Transportation Needs:   . Film/video editor (Medical):   Marland Kitchen Lack of Transportation (Non-Medical):   Physical Activity:   . Days of Exercise per Week:   . Minutes of Exercise per Session:   Stress:   . Feeling of Stress :   Social Connections:   . Frequency of Communication with Friends and Family:   . Frequency of Social Gatherings with Friends and Family:   . Attends Religious Services:   . Active Member of Clubs or Organizations:   . Attends Archivist Meetings:   Marland Kitchen Marital Status:   Intimate Partner Violence:   . Fear of Current or Ex-Partner:   . Emotionally Abused:   Marland Kitchen Physically Abused:   . Sexually Abused:    Review of Systems Wouldn't be here unless wife made him No problems with hand    Objective:   Physical Exam  Musculoskeletal:     Comments: Normal active ROM in left shoulder No bursa tenderness Internal/external rotation is fine Mild tenderness in mid lateral left arm---?slight irregularity (?muscle tear)           Assessment & Plan:

## 2019-09-02 NOTE — Patient Instructions (Signed)
Please try heat on the painful spot (and you can try over the counter diclofenac gel). You can take ibuprofen 200mg  (advil/motrin) 3 tabs at bedtime for the next 2-3 nights.

## 2019-12-02 DIAGNOSIS — M48062 Spinal stenosis, lumbar region with neurogenic claudication: Secondary | ICD-10-CM | POA: Diagnosis not present

## 2019-12-02 DIAGNOSIS — M4716 Other spondylosis with myelopathy, lumbar region: Secondary | ICD-10-CM | POA: Diagnosis not present

## 2019-12-04 ENCOUNTER — Other Ambulatory Visit: Payer: Self-pay

## 2019-12-04 ENCOUNTER — Encounter: Payer: Self-pay | Admitting: Family Medicine

## 2019-12-04 ENCOUNTER — Ambulatory Visit (INDEPENDENT_AMBULATORY_CARE_PROVIDER_SITE_OTHER): Payer: PPO | Admitting: Family Medicine

## 2019-12-04 DIAGNOSIS — M545 Low back pain, unspecified: Secondary | ICD-10-CM | POA: Insufficient documentation

## 2019-12-04 DIAGNOSIS — M5441 Lumbago with sciatica, right side: Secondary | ICD-10-CM

## 2019-12-04 MED ORDER — CYCLOBENZAPRINE HCL 10 MG PO TABS: 5.0000 mg | ORAL_TABLET | Freq: Three times a day (TID) | ORAL | 0 refills | Status: DC | PRN

## 2019-12-04 NOTE — Assessment & Plan Note (Signed)
With radiation to R leg - suspect sciatic nerve irritation  Spasm on exam  Xray report- ? Arthritis per pt  No neuro changes  Adv to continue medrol dose pak from urgent care and px flexeril for muscle relaxer at night (hope it will help sleep)  Heat/ice prn  Given handout with stretches Urgent ref to PT  If no improvement with consv care-consider MRI (would likely also need repaeat xrays here)  Update if not starting to improve in a week or if worsening

## 2019-12-04 NOTE — Progress Notes (Signed)
Subjective:    Patient ID: [name] [name], male    DOB: 01-08-1951, 69 y.o.   MRN: [id number]  This visit occurred during the SARS-CoV-2 public health emergency.  Safety protocols were in place, including screening questions prior to the visit, additional usage of staff PPE, and extensive cleaning of exam room while observing appropriate contact time as indicated for disinfecting solutions.    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 Sciatic nerve problem   Px methocarbamol  Also medrol dose pak  (just started yesterday)  Diclofenac to use when done with prednisone  Using heat- helps a little when using it  He gets in a hot tub every night  He looked on line at some exercises/stretches   He went [facility] a walk in clinic [facility]   Patient Active Problem List   Diagnosis Date Noted   Low back pain 12/04/2019   Left arm pain 09/02/2019   Orthostatic dizziness 03/19/2019   Testicle pain 10/30/2017   Thoracic aorta atherosclerosis (HCC)    Preventative health care 10/17/2016   Hemoptysis 10/17/2016   Carotid artery disease (HCC) 03/14/2012   Cerebrovascular disease 01/27/2011   Hypertension 01/27/2011   CAROTID BRUIT 01/26/2010   TOBACCO ABUSE 08/27/2008   Hyperlipidemia LDL goal <70 04/15/2007   HYPERCHOLESTEROLEMIA 04/11/2007   GLAUCOMA 04/11/2007   CAD S/P percutaneous coronary angioplasty 04/11/2007   COPD (chronic obstructive pulmonary disease) (HCC) 04/11/2007   GERD 04/11/2007   BARRETTS ESOPHAGUS 04/11/2007     COLONIC POLYPS, HX OF 04/11/2007   Past Medical History:  Diagnosis Date   BARRETTS ESOPHAGUS    Carotid artery occlusion    COLONIC POLYPS, HX OF    COPD    CORONARY ARTERY DISEASE    a. 2007: cath showing 95% stenosis proximal LAD (BMS placed), 40% D1, and 75% RCA stenosis. b. NST 08/2008: no evidence of ischemia c. NST 04/2012: no evidence of ischemia.   GERD    GLAUCOMA    HYPERCHOLESTEROLEMIA    HYPERLIPIDEMIA    NSTEMI (non-ST elevated myocardial infarction) (HCC) 02/25/2016   Other constipation    OTITIS EXTERNA    Peripheral vascular disease (HCC)    Thoracic aorta atherosclerosis (HCC)    Past Surgical History:  Procedure Laterality Date   CARDIAC CATHETERIZATION N/A 02/26/2016   Procedure: Left Heart Cath and Coronary Angiography;  Surgeon: Iran Ouch, MD;  Location: [facility] INVASIVE CV LAB;  Service: Cardiovascular;  Laterality: N/A;   CARDIAC CATHETERIZATION N/A 02/26/2016   Procedure: Coronary Stent Intervention;  Surgeon: Iran Ouch, MD;  Location: [facility] INVASIVE CV LAB;  Service: Cardiovascular;  Laterality: N/A;   CORONARY STENT PLACEMENT     ESOPHAGOGASTRODUODENOSCOPY     UMBILICAL HERNIA REPAIR     Social History   Tobacco Use   Smoking status: Former Smoker    Packs/day: 0.10    Years: 47.00    Pack  years: 4.70    Types: Cigarettes    Quit date: 09/24/2013    Years since quitting: 6.1   Smokeless tobacco: Current User    Types: Chew  Vaping Use   Vaping Use: Never used  Substance Use Topics   Alcohol use: Yes    Alcohol/week: 2.0 standard drinks    Types: 2 Cans of beer per week    Comment: occ   Drug use: No   Family History  Problem Relation Age of Onset   Heart disease Father 73       Before age 32- Open Heart surgery   Hyperlipidemia Mother    No Known Allergies Current Outpatient Medications on File Prior to Visit  Medication Sig Dispense Refill   aspirin EC 81 MG tablet Take 1 tablet (81 mg total)  by mouth daily. 90 tablet 3   methocarbamol (ROBAXIN) 750 MG tablet Take 750 mg by mouth every 8 (eight) hours.     methylPREDNISolone (MEDROL DOSEPAK) 4 MG TBPK tablet Take by mouth. Taper dose     metoprolol tartrate (LOPRESSOR) 25 MG tablet Take 0.5 tablets (12.5 mg total) by mouth 2 (two) times daily. NEED OV FOR FUTURE REFILL. 180 tablet 1   Multiple Vitamin (MULTIVITAMIN WITH MINERALS) TABS tablet Take 1 tablet by mouth daily.     nitroGLYCERIN (NITROSTAT) 0.4 MG SL tablet Place 1 tablet (0.4 mg total) under the tongue every 5 (five) minutes as needed for chest pain. 25 tablet 2   pantoprazole (PROTONIX) 40 MG tablet TAKE 1 TABLET(40 MG) BY MOUTH DAILY 90 tablet 3   rosuvastatin (CRESTOR) 40 MG tablet TAKE 1 TABLET(40 MG) BY MOUTH DAILY 90 tablet 3   diclofenac (VOLTAREN) 75 MG EC tablet Take 75 mg by mouth 2 (two) times daily. (Patient not taking: Reported on 12/04/2019)     No current facility-administered medications on file prior to visit.     Review of Systems  Constitutional: Negative for activity change, appetite change, fatigue, fever and unexpected weight change.  HENT: Negative for congestion, rhinorrhea, sore throat and trouble swallowing.   Eyes: Negative for pain, redness, itching and visual disturbance.  Respiratory: Negative for cough, chest tightness, shortness of breath and wheezing.   Cardiovascular: Negative for chest pain, palpitations and leg swelling.  Gastrointestinal: Negative for abdominal pain, blood in stool, constipation, diarrhea and nausea.  Endocrine: Negative for cold intolerance, heat intolerance, polydipsia and polyuria.  Genitourinary: Negative for difficulty urinating, dysuria, frequency and urgency.  Musculoskeletal: Positive for back pain. Negative for arthralgias, joint swelling and myalgias.  Skin: Negative for pallor and rash.  Neurological: Negative for dizziness, tremors, weakness, numbness and headaches.  Hematological: Negative for  adenopathy. Does not bruise/bleed easily.  Psychiatric/Behavioral: Negative for decreased concentration and dysphoric mood. The patient is not nervous/anxious.        Objective:   Physical Exam Constitutional:      General: He is not in acute distress.    Appearance: Normal appearance. He is well-developed. He is not ill-appearing.  HENT:     Head: Normocephalic and atraumatic.     Mouth/Throat:     Mouth: Mucous membranes are moist.  Eyes:     General: No scleral icterus.    Conjunctiva/sclera: Conjunctivae normal.     Pupils: Pupils are equal, round, and reactive to light.  Cardiovascular:     Rate and Rhythm: Normal rate and regular rhythm.  Pulmonary:     Effort: Pulmonary effort is normal.     Breath  sounds: Normal breath sounds. No wheezing or rales.  Abdominal:     General: Bowel sounds are normal. There is no distension.     Palpations: Abdomen is soft.     Tenderness: There is no abdominal tenderness.  Musculoskeletal:        General: Tenderness present.     Cervical back: Normal range of motion and neck supple.     Lumbar back: Spasms and tenderness present. No edema, deformity or bony tenderness. Decreased range of motion. Negative right straight leg raise test and negative left straight leg raise test.     Right lower leg: No edema.     Left lower leg: No edema.     Comments: Spasm noted in R lumbar musculature-palpable and tender  Mild tenderness of L4-5 vertebrae  Flex-full (actually helps pain) Ext and R sided lateral bend are limited by pain  No neuro changes  No foot drop  Lymphadenopathy:     Cervical: No cervical adenopathy.  Skin:    General: Skin is warm and dry.     Coloration: Skin is not pale.     Findings: No erythema or rash.  Neurological:     Mental Status: He is alert.     Cranial Nerves: No cranial nerve deficit.     Sensory: No sensory deficit.     Motor: No weakness, atrophy or abnormal muscle tone.     Coordination: Coordination  normal.     Deep Tendon Reflexes: Reflexes are normal and symmetric. Reflexes normal.     Comments: Negative SLR  Psychiatric:        Mood and Affect: Mood normal.           Assessment & Plan:   Problem List Items Addressed This Visit      Other   Low back pain    With radiation to R leg - suspect sciatic nerve irritation  Spasm on exam  Xray report- ? Arthritis per pt  No neuro changes  Adv to continue medrol dose pak from urgent care and px flexeril for muscle relaxer at night (hope it will help sleep)  Heat/ice prn  Given handout with stretches Urgent ref to PT  If no improvement with consv care-consider MRI (would likely also need repaeat xrays here)  Update if not starting to improve in a week or if worsening        Relevant Medications   diclofenac (VOLTAREN) 75 MG EC tablet   methylPREDNISolone (MEDROL DOSEPAK) 4 MG TBPK tablet   methocarbamol (ROBAXIN) 750 MG tablet   cyclobenzaprine (FLEXERIL) 10 MG tablet   Other Relevant Orders   Ambulatory referral to Physical Therapy

## 2019-12-04 NOTE — Patient Instructions (Addendum)
Heat/ice are ok -whichever you prefer  Take a look at the back stretches on the handout  Let's try a different muscle relaxer (generic flexeril) for night time to help spasms and sleep Try to sleep on back with pillow under bent knees or side with pillow between your knees  Take the medrol dose pack as directed   When you finish that - you can go back to the diclofenac   I placed a physical therapy referral - our office will call you later today or tomorrow to work on that   If symptoms suddenly worsen-please call (go to ER if severe)

## 2019-12-05 NOTE — Progress Notes (Signed)
Noted  

## 2019-12-06 ENCOUNTER — Telehealth: Payer: Self-pay | Admitting: Internal Medicine

## 2019-12-06 DIAGNOSIS — M5441 Lumbago with sciatica, right side: Secondary | ICD-10-CM

## 2019-12-06 MED ORDER — TRAMADOL HCL 50 MG PO TABS: 50.0000 mg | ORAL_TABLET | Freq: Two times a day (BID) | ORAL | 0 refills | Status: DC | PRN

## 2019-12-06 NOTE — Telephone Encounter (Signed)
Spoke to pt. He would like to have something for pain if possible. He is aware that both Dr Alphonsus Sias and Dr Milinda Antis are out of the office. He will come Monday to do the xray.

## 2019-12-06 NOTE — Telephone Encounter (Signed)
I sent tramadol to his pharmacy  Caution of sedation and constipation

## 2019-12-06 NOTE — Telephone Encounter (Signed)
Pt's wife said pt saw Dr. Milinda Antis on Wednesday and he is not better. He is in a lot of pain in which he is in tears. The muscle relaxers are not dulling his pain. His shin is hurting as well up to his thigh. She wants to know if Dr. Milinda Antis can recommend anything else?

## 2019-12-06 NOTE — Telephone Encounter (Signed)
Noted Will await x-rays Already got steroids

## 2019-12-06 NOTE — Telephone Encounter (Signed)
Message left for patient to return my call.  

## 2019-12-06 NOTE — Telephone Encounter (Signed)
Spoke to pt. He said he would like to do an xray. York Spaniel he could not get here until Monday to do that. In regards to pain medication, he said he has tried pain medications in the past but cannot remember what he has been on. Does not like opioids due to constipation.

## 2019-12-06 NOTE — Telephone Encounter (Signed)
I would like to get an xray on him here so we have the formal report - can make plan from there  How does he do with pain relievers ?  Has he had tramadol or other strong medications before?  Let me know if ok with xray   I will cc to PCP as well

## 2019-12-06 NOTE — Telephone Encounter (Signed)
I put in future order for xray  Tramadol may also constipate  (I advise stool softeners, etc)  If he wants this sent in let me know  It can sedate/use caution (for short term use only)

## 2019-12-09 ENCOUNTER — Ambulatory Visit (INDEPENDENT_AMBULATORY_CARE_PROVIDER_SITE_OTHER)
Admission: RE | Admit: 2019-12-09 | Discharge: 2019-12-09 | Disposition: A | Discharge status: Home / Self Care | Payer: PPO | Source: Ambulatory Visit | Attending: Family Medicine | Admitting: Family Medicine

## 2019-12-09 DIAGNOSIS — M5441 Lumbago with sciatica, right side: Secondary | ICD-10-CM | POA: Diagnosis not present

## 2019-12-09 DIAGNOSIS — M545 Low back pain: Secondary | ICD-10-CM | POA: Diagnosis not present

## 2019-12-09 NOTE — Telephone Encounter (Signed)
Spoke to pt. He did get the medication Friday.

## 2019-12-11 ENCOUNTER — Telehealth: Payer: Self-pay | Admitting: Family Medicine

## 2019-12-11 DIAGNOSIS — M5441 Lumbago with sciatica, right side: Secondary | ICD-10-CM

## 2019-12-11 NOTE — Telephone Encounter (Signed)
-----   Message from Shon Millet, [place] sent at 12/11/2019  4:55 PM EDT ----- Pt agrees with MRI referral he said he lives in [place] so if we could do it close to [place] as possible, he said [place] is closer then [place] if he had to choose one. Pt ?? What he can do for the pain. He said he is still not sleeping good it takes him an hour to an hour an a half to fall a sleep due to the pain, he has to lay a certain way on his right side to even sleep. Pt said he can hardly walk without sever pain radiating from his hip all the way to his foot.

## 2019-12-11 NOTE — Telephone Encounter (Signed)
Order is done and sending to [facility]  Tramadol is the best I can do for pain - does he need a refill ?

## 2019-12-12 NOTE — Telephone Encounter (Signed)
Interesting- I don't think he has had spine surgery before so usually without contrast is the standard -wonder why they need the change  I re ordered it for with and without  Thanks !

## 2019-12-13 MED ORDER — TRAMADOL HCL 50 MG PO TABS
50.0000 mg | ORAL_TABLET | Freq: Two times a day (BID) | ORAL | 0 refills | Status: DC | PRN
Start: 2019-12-13 — End: 2020-01-11

## 2019-12-13 NOTE — Telephone Encounter (Signed)
Pt notified of Dr. Tower's comments and verbalized understanding  

## 2019-12-13 NOTE — Telephone Encounter (Signed)
Pt said he only has 2 more pills of tramadol left so he would like a refill if pt uses [facility]/ [address]  Pt did want me to let you know that he is still having the same pain he did from our last conversations but he also has developed urinary sxs too. Pt said when he goes to the bathroom he feels like he has to go bad but when he actually goes it's only a "trickle". Pt also has dysuria pt thought it was due to him taking pain meds because he knows they can cause constipation so the thought they could cause urinary issues too. I advised him that I would let Dr. Milinda Antis know because his sxs may not be related to pain med since they are new sxs

## 2019-12-13 NOTE — Telephone Encounter (Signed)
A back issue can cause loss of continence but I would not expect dysuria or hesitancy   The pain medicine can cause some urinary changes however  (see how he feels in between doses)   Follow up if urine symptoms persist or worsen and thenks for letting me know   I sent the tramadol

## 2019-12-18 ENCOUNTER — Ambulatory Visit
Admission: RE | Admit: 2019-12-18 | Discharge: 2019-12-18 | Disposition: A | Discharge status: Home / Self Care | Payer: PPO | Source: Ambulatory Visit | Attending: Family Medicine | Admitting: Family Medicine

## 2019-12-18 ENCOUNTER — Other Ambulatory Visit: Payer: Self-pay | Admitting: Family Medicine

## 2019-12-18 ENCOUNTER — Telehealth: Payer: Self-pay | Admitting: Family Medicine

## 2019-12-18 DIAGNOSIS — Z01818 Encounter for other preprocedural examination: Secondary | ICD-10-CM | POA: Diagnosis not present

## 2019-12-18 DIAGNOSIS — M5126 Other intervertebral disc displacement, lumbar region: Secondary | ICD-10-CM | POA: Diagnosis not present

## 2019-12-18 DIAGNOSIS — M5441 Lumbago with sciatica, right side: Secondary | ICD-10-CM

## 2019-12-18 DIAGNOSIS — M4807 Spinal stenosis, lumbosacral region: Secondary | ICD-10-CM | POA: Diagnosis not present

## 2019-12-18 DIAGNOSIS — M48061 Spinal stenosis, lumbar region without neurogenic claudication: Secondary | ICD-10-CM | POA: Diagnosis not present

## 2019-12-18 DIAGNOSIS — M5127 Other intervertebral disc displacement, lumbosacral region: Secondary | ICD-10-CM | POA: Diagnosis not present

## 2019-12-18 MED ORDER — GADOBENATE DIMEGLUMINE 529 MG/ML IV SOLN
17.0000 mL | Freq: Once | INTRAVENOUS | Status: AC | PRN
  Administered 2019-12-18: 17 mL via INTRAVENOUS

## 2019-12-18 NOTE — Telephone Encounter (Signed)
noted 

## 2019-12-18 NOTE — Telephone Encounter (Signed)
Urgent ref done  Routing to pcc

## 2019-12-18 NOTE — Telephone Encounter (Signed)
-----   Message from Shon Millet, [place] sent at 12/18/2019  1:59 PM EDT ----- Pt notified of MRI results and Dr. Royden Purl comments. Pt agrees with Neurosurgeon referral. He is still in a lot of pain so he would like to get in ASAP also but if possible he said [place] is closer but if he has to go to [place] to see someone quickly he si willing to drive there also

## 2019-12-19 NOTE — Telephone Encounter (Signed)
Fantastic. Thank you so much : )

## 2019-12-19 NOTE — Telephone Encounter (Signed)
Pt schedule with Dr Maisie Fus with [facility] on Friday 12/20/19 at 2pm Pt is aware of this appt/date/time.   FYI

## 2019-12-20 DIAGNOSIS — M5416 Radiculopathy, lumbar region: Secondary | ICD-10-CM | POA: Diagnosis not present

## 2019-12-31 DIAGNOSIS — Z1152 Encounter for screening for COVID-19: Secondary | ICD-10-CM | POA: Diagnosis not present

## 2020-01-01 ENCOUNTER — Encounter: Payer: Self-pay | Admitting: Internal Medicine

## 2020-01-01 ENCOUNTER — Ambulatory Visit (INDEPENDENT_AMBULATORY_CARE_PROVIDER_SITE_OTHER): Payer: PPO | Admitting: Internal Medicine

## 2020-01-01 ENCOUNTER — Other Ambulatory Visit: Payer: Self-pay

## 2020-01-01 VITALS — BP 110/78 | HR 71 | Temp 97.9°F | Ht 71.0 in | Wt 168.0 lb

## 2020-01-01 DIAGNOSIS — Z01818 Encounter for other preprocedural examination: Secondary | ICD-10-CM | POA: Diagnosis not present

## 2020-01-01 DIAGNOSIS — I2510 Atherosclerotic heart disease of native coronary artery without angina pectoris: Secondary | ICD-10-CM

## 2020-01-01 DIAGNOSIS — Z9861 Coronary angioplasty status: Secondary | ICD-10-CM | POA: Diagnosis not present

## 2020-01-01 DIAGNOSIS — J449 Chronic obstructive pulmonary disease, unspecified: Secondary | ICD-10-CM

## 2020-01-01 DIAGNOSIS — M5126 Other intervertebral disc displacement, lumbar region: Secondary | ICD-10-CM

## 2020-01-01 NOTE — Assessment & Plan Note (Signed)
Still occasionally smokes No symptoms Urged him to abstain No medications for this

## 2020-01-01 NOTE — Assessment & Plan Note (Signed)
Stent 4 years ago Stress test fine 2020 No acute symptoms EKG shows sinus at 64. RSR pattern in V1 is nonspecific and not new. No hypertrophy or ischemic changes. No change since 03/22/19

## 2020-01-01 NOTE — Progress Notes (Signed)
Subjective:    Patient ID: [name] [name], male    DOB: 1950-06-23, 69 y.o.   MRN: [id number]  HPI Here for medical evaluation prior to surgery This visit occurred during the SARS-CoV-2 public health emergency.  Safety protocols were in place, including screening questions prior to the visit, additional usage of staff PPE, and extensive cleaning of exam room while observing appropriate contact time as indicated for disinfecting solutions.   Picked up a door of a car---it slipped and he had to move to try to catch it Felt okay at first Next day---had severe pain Came in --saw Dr Milinda Antis Then MRI done ---showed extruded disc fragments and stenosis L4-5 area Has planned microdiscectomy tomorrow Stopped aspirin a week ago Pain still severe--not able to sleep in bed  No chest pain No SOB No dizziness or syncope No edema No palpitations  No cough No wheezing   Current Outpatient Medications on File Prior to Visit  Medication Sig Dispense Refill  . aspirin EC 81 MG tablet Take 1 tablet (81 mg total) by mouth daily. 90 tablet 3  . metoprolol tartrate (LOPRESSOR) 25 MG tablet Take 0.5 tablets (12.5 mg total) by mouth 2 (two) times daily. NEED OV FOR FUTURE REFILL. 180 tablet 1  . Multiple Vitamin (MULTIVITAMIN WITH MINERALS) TABS tablet Take 1 tablet by mouth daily.    . nitroGLYCERIN (NITROSTAT) 0.4 MG SL tablet Place 1 tablet (0.4 mg total) under the tongue every 5 (five) minutes as needed for chest pain. 25 tablet 2  . pantoprazole (PROTONIX) 40 MG tablet TAKE 1 TABLET(40 MG) BY MOUTH DAILY 90 tablet 3  . rosuvastatin (CRESTOR) 40 MG tablet TAKE 1 TABLET(40 MG) BY MOUTH DAILY 90 tablet 3  . cyclobenzaprine (FLEXERIL) 10 MG tablet Take 0.5-1 tablets (5-10 mg total) by mouth 3 (three) times daily as needed for muscle spasms. Caution of sedation (Patient not taking: Reported on 01/01/2020) 20 tablet 0  . diclofenac (VOLTAREN) 75 MG EC tablet Take 75 mg by mouth 2 (two) times daily.  (Patient  not taking: Reported on 01/01/2020)    . methocarbamol (ROBAXIN) 750 MG tablet Take 750 mg by mouth every 8 (eight) hours. (Patient not taking: Reported on 01/01/2020)     No current facility-administered medications on file prior to visit.    No Known Allergies  Past Medical History:  Diagnosis Date  . BARRETTS ESOPHAGUS   . Carotid artery occlusion   . COLONIC POLYPS, HX OF   . COPD   . CORONARY ARTERY DISEASE    a. 2007: cath showing 95% stenosis proximal LAD (BMS placed), 40% D1, and 75% RCA stenosis. b. NST 08/2008: no evidence of ischemia c. NST 04/2012: no evidence of ischemia.  Marland Kitchen GERD   . GLAUCOMA   . HYPERCHOLESTEROLEMIA   . HYPERLIPIDEMIA   . NSTEMI (non-ST elevated myocardial infarction) (HCC) 02/25/2016  . Other constipation   . OTITIS EXTERNA   . Peripheral vascular disease (HCC)   . Thoracic aorta atherosclerosis [facility])     Past Surgical History:  Procedure Laterality Date  . CARDIAC CATHETERIZATION N/A 02/26/2016   Procedure: Left Heart Cath and Coronary Angiography;  Surgeon: Iran Ouch, MD;  Location: [facility] INVASIVE CV LAB;  Service: Cardiovascular;  Laterality: N/A;  . CARDIAC CATHETERIZATION N/A 02/26/2016   Procedure: Coronary Stent Intervention;  Surgeon: Iran Ouch, MD;  Location: [facility] INVASIVE CV LAB;  Service: Cardiovascular;  Laterality: N/A;  . CORONARY STENT PLACEMENT    . ESOPHAGOGASTRODUODENOSCOPY    .  UMBILICAL HERNIA REPAIR      Family History  Problem Relation Age of Onset  . Heart disease Father 67       Before age 27- Open Heart surgery  . Hyperlipidemia Mother     Social History   Socioeconomic History  . Marital status: Married    Spouse name: Not on file  . Number of children: Not on file  . Years of education: Not on file  . Highest education level: Not on file  Occupational History  . Not on file  Tobacco Use  . Smoking status: Former Smoker    Packs/day: 0.10    Years: 47.00    Pack years: 4.70    Types: Cigarettes     Quit date: 09/24/2013    Years since quitting: 6.2  . Smokeless tobacco: Current User    Types: Chew  Vaping Use  . Vaping Use: Never used  Substance and Sexual Activity  . Alcohol use: Yes    Alcohol/week: 2.0 standard drinks    Types: 2 Cans of beer per week    Comment: occ  . Drug use: No  . Sexual activity: Not on file  Other Topics Concern  . Not on file  Social History Narrative   No living will    Would want wife, then son, would be health care POA   Would accept resuscitation attempts--but no prolonged artificial ventilation   Not sure about tube feeds   Social Determinants of Health   Financial Resource Strain:   . Difficulty of Paying Living Expenses: Not on file  Food Insecurity:   . Worried About Programme researcher, broadcasting/film/video in the Last Year: Not on file  . Ran Out of Food in the Last Year: Not on file  Transportation Needs:   . Lack of Transportation (Medical): Not on file  . Lack of Transportation (Non-Medical): Not on file  Physical Activity:   . Days of Exercise per Week: Not on file  . Minutes of Exercise per Session: Not on file  Stress:   . Feeling of Stress : Not on file  Social Connections:   . Frequency of Communication with Friends and Family: Not on file  . Frequency of Social Gatherings with Friends and Family: Not on file  . Attends Religious Services: Not on file  . Active Member of Clubs or Organizations: Not on file  . Attends Banker Meetings: Not on file  . Marital Status: Not on file  Intimate Partner Violence:   . Fear of Current or Ex-Partner: Not on file  . Emotionally Abused: Not on file  . Physically Abused: Not on file  . Sexually Abused: Not on file   Review of Systems     Objective:   Physical Exam Constitutional:      Appearance: Normal appearance.  Cardiovascular:     Rate and Rhythm: Normal rate and regular rhythm.     Heart sounds: No murmur heard.  No gallop.   Pulmonary:     Effort: Pulmonary effort is  normal.     Breath sounds: Normal breath sounds. No wheezing or rales.  Musculoskeletal:     Cervical back: Neck supple.     Right lower leg: No edema.     Left lower leg: No edema.  Lymphadenopathy:     Cervical: No cervical adenopathy.  Neurological:     Mental Status: He is alert.  Psychiatric:        Mood and Affect: Mood normal.  Behavior: Behavior normal.            Assessment & Plan:

## 2020-01-01 NOTE — Assessment & Plan Note (Signed)
No evidence of active ischemia Breathing is okay Moderate risk due to COPD/CAD---but acceptable Has stopped the aspirin Okay to proceed with microdiskectomy as planned tomorrow

## 2020-01-01 NOTE — Assessment & Plan Note (Signed)
Still with severe pain Awaiting the surgery Narcotics "stop me up"--so stopped them

## 2020-01-02 ENCOUNTER — Telehealth: Payer: Self-pay

## 2020-01-02 NOTE — Telephone Encounter (Signed)
Pt left a message on triage line stating he found out he is covid positive. He is not having any symptoms. Wants a rx for hydroxychloroquine. Please advise at [phone].

## 2020-01-02 NOTE — Telephone Encounter (Signed)
Sorry to hear that---it will postpone his surgery. Please let him know he is about a year behind the news----hydroxychloroquine has been proven ineffective If he had any symptoms, we could consider referral for monoclonal antibody infusion----but not if he has no symptoms

## 2020-01-02 NOTE — Telephone Encounter (Signed)
Spoke to pt

## 2020-01-09 ENCOUNTER — Other Ambulatory Visit: Payer: Self-pay | Admitting: Family Medicine

## 2020-01-15 DIAGNOSIS — Z1152 Encounter for screening for COVID-19: Secondary | ICD-10-CM | POA: Diagnosis not present

## 2020-01-21 DIAGNOSIS — M5126 Other intervertebral disc displacement, lumbar region: Secondary | ICD-10-CM | POA: Diagnosis not present

## 2020-01-21 DIAGNOSIS — M5416 Radiculopathy, lumbar region: Secondary | ICD-10-CM | POA: Diagnosis not present

## 2020-02-25 ENCOUNTER — Other Ambulatory Visit: Payer: Self-pay

## 2020-02-25 ENCOUNTER — Ambulatory Visit (INDEPENDENT_AMBULATORY_CARE_PROVIDER_SITE_OTHER): Payer: PPO | Admitting: Family Medicine

## 2020-02-25 ENCOUNTER — Encounter: Payer: Self-pay | Admitting: Family Medicine

## 2020-02-25 VITALS — BP 116/64 | HR 88 | Temp 97.1°F | Ht 71.0 in | Wt 174.1 lb

## 2020-02-25 DIAGNOSIS — M79602 Pain in left arm: Secondary | ICD-10-CM | POA: Diagnosis not present

## 2020-02-25 DIAGNOSIS — Z23 Encounter for immunization: Secondary | ICD-10-CM

## 2020-02-25 MED ORDER — METHOCARBAMOL 750 MG PO TABS
750.0000 mg | ORAL_TABLET | Freq: Three times a day (TID) | ORAL | 0 refills | Status: DC
Start: 2020-02-25 — End: 2020-05-15

## 2020-02-25 MED ORDER — PREDNISONE 10 MG PO TABS: ORAL_TABLET | ORAL | 0 refills | Status: DC

## 2020-02-25 NOTE — Progress Notes (Signed)
Subjective:    Patient ID: [name] [name], male    DOB: 1950/10/03, 69 y.o.   MRN: [id number]  This visit occurred during the SARS-CoV-2 public health emergency.  Safety protocols were in place, including screening questions prior to the visit, additional usage of staff PPE, and extensive cleaning of exam room while observing appropriate contact time as indicated for disinfecting solutions.    HPI Pt presents with 1-2 weeks of L upper arm pain  Hurts /throbs at night and when her gets up  Points to deltoid area   Can elevate arm fully -can int/ext rotate shoulder  Can move head/neck   Pain worse at the end of day and night  No swelling or bruising  He is R handed    No trauma or new activity   Just had back surgery- did well !  Per record-he also saw pcp in the past for L arm pain after heavy lifting 5/21   Takes tylenol pm  Has used heat (it feels better than cold)  Also topicals -unsure what it was he used   Also wants flu vaccine   He did take a left over oxycodone (took tramadol originally but not now)  Had robaxin in the past  Not taking diclofenac   Had a my pillow brand pillow-had to change it (uncomfortable) Feels fair on current pillow and sleeps well  Usually sleeps on his other side   Patient Active Problem List   Diagnosis Date Noted  . Preoperative clearance 01/01/2020  . Lumbar disc herniation 12/18/2019  . Low back pain 12/04/2019  . Left arm pain 09/02/2019  . Orthostatic dizziness 03/19/2019  . Testicle pain 10/30/2017  . Thoracic aorta atherosclerosis (HCC)   . Preventative health care 10/17/2016  . Hemoptysis 10/17/2016  . Carotid artery disease (HCC) 03/14/2012  . Cerebrovascular disease 01/27/2011  . Hypertension 01/27/2011  . CAROTID BRUIT 01/26/2010  . TOBACCO ABUSE 08/27/2008  . Hyperlipidemia LDL goal <70 04/15/2007  . HYPERCHOLESTEROLEMIA 04/11/2007  . GLAUCOMA 04/11/2007  . CAD S/P percutaneous coronary angioplasty 04/11/2007  .  COPD (chronic obstructive pulmonary disease) (HCC) 04/11/2007  . GERD 04/11/2007  . BARRETTS ESOPHAGUS 04/11/2007  . COLONIC POLYPS, HX OF 04/11/2007   Past Medical History:  Diagnosis Date  . BARRETTS ESOPHAGUS   . Carotid artery occlusion   . COLONIC POLYPS, HX OF   . COPD   . CORONARY ARTERY DISEASE    a. 2007: cath showing 95% stenosis proximal LAD (BMS placed), 40% D1, and 75% RCA stenosis. b. NST 08/2008: no evidence of ischemia c. NST 04/2012: no evidence of ischemia.  Marland Kitchen GERD   . GLAUCOMA   . HYPERCHOLESTEROLEMIA   . HYPERLIPIDEMIA   . NSTEMI (non-ST elevated myocardial infarction) (HCC) 02/25/2016  . Other constipation   . OTITIS EXTERNA   . Peripheral vascular disease (HCC)   . Thoracic aorta atherosclerosis [facility])    Past Surgical History:  Procedure Laterality Date  . CARDIAC CATHETERIZATION N/A 02/26/2016   Procedure: Left Heart Cath and Coronary Angiography;  Surgeon: Iran Ouch, MD;  Location: [facility] INVASIVE CV LAB;  Service: Cardiovascular;  Laterality: N/A;  . CARDIAC CATHETERIZATION N/A 02/26/2016   Procedure: Coronary Stent Intervention;  Surgeon: Iran Ouch, MD;  Location: [facility] INVASIVE CV LAB;  Service: Cardiovascular;  Laterality: N/A;  . CORONARY STENT PLACEMENT    . ESOPHAGOGASTRODUODENOSCOPY    . UMBILICAL HERNIA REPAIR     Social History   Tobacco Use  . Smoking  status: Former Smoker    Packs/day: 0.10    Years: 47.00    Pack years: 4.70    Types: Cigarettes    Quit date: 09/24/2013    Years since quitting: 6.4  . Smokeless tobacco: Current User    Types: Chew  Vaping Use  . Vaping Use: Never used  Substance Use Topics  . Alcohol use: Yes    Alcohol/week: 2.0 standard drinks    Types: 2 Cans of beer per week    Comment: occ  . Drug use: No   Family History  Problem Relation Age of Onset  . Heart disease Father 76       Before age 42- Open Heart surgery  . Hyperlipidemia Mother    No Known Allergies Current Outpatient  Medications on File Prior to Visit  Medication Sig Dispense Refill  . aspirin EC 81 MG tablet Take 1 tablet (81 mg total) by mouth daily. 90 tablet 3  . cyclobenzaprine (FLEXERIL) 10 MG tablet TAKE 1/2 TO 1 TABLET BY MOUTH THREE TIMES DAILY AS NEEDED FOR MUSCLE SPASMS. MAY CAUSE DROWSINESS. 20 tablet 0  . diclofenac (VOLTAREN) 75 MG EC tablet Take 75 mg by mouth 2 (two) times daily.     . metoprolol tartrate (LOPRESSOR) 25 MG tablet Take 0.5 tablets (12.5 mg total) by mouth 2 (two) times daily. NEED OV FOR FUTURE REFILL. 180 tablet 1  . Multiple Vitamin (MULTIVITAMIN WITH MINERALS) TABS tablet Take 1 tablet by mouth daily.    . nitroGLYCERIN (NITROSTAT) 0.4 MG SL tablet Place 1 tablet (0.4 mg total) under the tongue every 5 (five) minutes as needed for chest pain. 25 tablet 2  . oxyCODONE-acetaminophen (PERCOCET/ROXICET) 5-325 MG tablet Take 1-2 tablets by mouth every 4 (four) hours as needed.    . pantoprazole (PROTONIX) 40 MG tablet TAKE 1 TABLET(40 MG) BY MOUTH DAILY 90 tablet 3  . rosuvastatin (CRESTOR) 40 MG tablet TAKE 1 TABLET(40 MG) BY MOUTH DAILY 90 tablet 3  . traMADol (ULTRAM) 50 MG tablet Take 1 tablet (50 mg total) by mouth every 6 (six) hours as needed. 20 tablet 0   No current facility-administered medications on file prior to visit.     Review of Systems  Constitutional: Negative for activity change, appetite change, fatigue, fever and unexpected weight change.  HENT: Negative for congestion, rhinorrhea, sore throat and trouble swallowing.   Eyes: Negative for pain, redness, itching and visual disturbance.  Respiratory: Negative for cough, chest tightness, shortness of breath and wheezing.   Cardiovascular: Negative for chest pain and palpitations.  Gastrointestinal: Negative for abdominal pain, blood in stool, constipation, diarrhea and nausea.  Endocrine: Negative for cold intolerance, heat intolerance, polydipsia and polyuria.  Genitourinary: Negative for difficulty  urinating, dysuria, frequency and urgency.  Musculoskeletal: Negative for arthralgias, joint swelling and myalgias.       Pain of L upper arm   Skin: Negative for pallor and rash.  Neurological: Negative for dizziness, tremors, weakness, numbness and headaches.  Hematological: Negative for adenopathy. Does not bruise/bleed easily.  Psychiatric/Behavioral: Negative for decreased concentration and dysphoric mood. The patient is not nervous/anxious.        Objective:   Physical Exam Constitutional:      General: He is not in acute distress.    Appearance: Normal appearance. He is normal weight. He is not ill-appearing.  Eyes:     General: No scleral icterus.    Conjunctiva/sclera: Conjunctivae normal.     Pupils: Pupils are equal, round, and  reactive to light.  Neck:     Vascular: No carotid bruit.  Cardiovascular:     Rate and Rhythm: Normal rate and regular rhythm.  Pulmonary:     Effort: Pulmonary effort is normal. No respiratory distress.     Breath sounds: Normal breath sounds. No wheezing.  Musculoskeletal:        General: Tenderness present. No swelling or deformity.     Left shoulder: Normal. No swelling, deformity, tenderness, bony tenderness or crepitus.     Left upper arm: Tenderness present. No swelling, deformity or bony tenderness.     Left elbow: No swelling or deformity. No tenderness.     Left forearm: Normal.     Cervical back: Normal range of motion and neck supple. No rigidity or tenderness.     Comments: Tender over L upper arm over deltoid and tricep area (soft tissue and muscle)  No swelling or bruising   No CS tenderness , nl rom of CS No shoulder tenderness  Nl rom of L shoulder without impingement signs   Lymphadenopathy:     Cervical: No cervical adenopathy.  Skin:    General: Skin is warm and dry.     Coloration: Skin is not pale.     Findings: No erythema or rash.  Neurological:     Mental Status: He is alert.     Cranial Nerves: No cranial  nerve deficit.     Sensory: Sensation is intact. No sensory deficit.     Motor: No weakness, atrophy or abnormal muscle tone.     Coordination: Coordination normal.     Deep Tendon Reflexes: Reflexes normal.  Psychiatric:        Mood and Affect: Mood normal.           Assessment & Plan:   Problem List Items Addressed This Visit      Other   Left arm pain - Primary    This looks to be recurrent but pt was unsure  No signs of cervical radiculopathy and nl rom of shoulder w/o impingement signs  Heat helps more than ice  Pain seems to be muscular in nature  Refilled methocarbamol for prn use (caution of sedation) and px prednisone 40 mg taper for inflammation If worse or no improvement would recommend imaging/xray and possibly sport med opinion        Other Visit Diagnoses    Need for influenza vaccination       Relevant Orders   Flu Vaccine QUAD High Dose(Fluad) (Completed)

## 2020-02-25 NOTE — Patient Instructions (Addendum)
Use heat on your arm for 10 minutes at a time  Take prednisone as directed  Also methocarbamol as needed (caution of sedation) -this is a muscle relaxer   If no improvement in 7-10 days please call (or if worse at any time)

## 2020-02-25 NOTE — Assessment & Plan Note (Signed)
This looks to be recurrent but pt was unsure  No signs of cervical radiculopathy and nl rom of shoulder w/o impingement signs  Heat helps more than ice  Pain seems to be muscular in nature  Refilled methocarbamol for prn use (caution of sedation) and px prednisone 40 mg taper for inflammation If worse or no improvement would recommend imaging/xray and possibly sport med opinion

## 2020-05-05 NOTE — Progress Notes (Deleted)
HPI: FU coronary disease. He underwent cardiac catheterization in 2007 and had a bare-metal stent to his LAD.Patient had repeat catheterization November 2017 and was found to have an 80% posterior lateral,significant RCA disease and ejection fraction 55-65%. Patient had 3 drug-eluting stents placed in the right coronary artery at that time.Abdominal ultrasound May 2018 showed no aneurysm. Nuclear study January 2020 showed ejection fraction 62% with no ischemia or infarction.  Also with cerebrovascular disease followed by vascular surgery.Since last seen,  Current Outpatient Medications  Medication Sig Dispense Refill  . aspirin EC 81 MG tablet Take 1 tablet (81 mg total) by mouth daily. 90 tablet 3  . cyclobenzaprine (FLEXERIL) 10 MG tablet TAKE 1/2 TO 1 TABLET BY MOUTH THREE TIMES DAILY AS NEEDED FOR MUSCLE SPASMS. MAY CAUSE DROWSINESS. 20 tablet 0  . diclofenac (VOLTAREN) 75 MG EC tablet Take 75 mg by mouth 2 (two) times daily.     . methocarbamol (ROBAXIN) 750 MG tablet Take 1 tablet (750 mg total) by mouth every 8 (eight) hours. As needed for muscle/arm pain with caution of sedation 20 tablet 0  . metoprolol tartrate (LOPRESSOR) 25 MG tablet Take 0.5 tablets (12.5 mg total) by mouth 2 (two) times daily. NEED OV FOR FUTURE REFILL. 180 tablet 1  . Multiple Vitamin (MULTIVITAMIN WITH MINERALS) TABS tablet Take 1 tablet by mouth daily.    . nitroGLYCERIN (NITROSTAT) 0.4 MG SL tablet Place 1 tablet (0.4 mg total) under the tongue every 5 (five) minutes as needed for chest pain. 25 tablet 2  . oxyCODONE-acetaminophen (PERCOCET/ROXICET) 5-325 MG tablet Take 1-2 tablets by mouth every 4 (four) hours as needed.    . pantoprazole (PROTONIX) 40 MG tablet TAKE 1 TABLET(40 MG) BY MOUTH DAILY 90 tablet 3  . predniSONE (DELTASONE) 10 MG tablet Take 3 pills once daily by mouth for 3 days, then 2 pills once daily for 3 days, then 1 pill once daily for 3 days and then stop 18 tablet 0  . rosuvastatin  (CRESTOR) 40 MG tablet TAKE 1 TABLET(40 MG) BY MOUTH DAILY 90 tablet 3  . traMADol (ULTRAM) 50 MG tablet Take 1 tablet (50 mg total) by mouth every 6 (six) hours as needed. 20 tablet 0   No current facility-administered medications for this visit.     Past Medical History:  Diagnosis Date  . BARRETTS ESOPHAGUS   . Carotid artery occlusion   . COLONIC POLYPS, HX OF   . COPD   . CORONARY ARTERY DISEASE    a. 2007: cath showing 95% stenosis proximal LAD (BMS placed), 40% D1, and 75% RCA stenosis. b. NST 08/2008: no evidence of ischemia c. NST 04/2012: no evidence of ischemia.  Marland Kitchen GERD   . GLAUCOMA   . HYPERCHOLESTEROLEMIA   . HYPERLIPIDEMIA   . NSTEMI (non-ST elevated myocardial infarction) (HCC) 02/25/2016  . Other constipation   . OTITIS EXTERNA   . Peripheral vascular disease (HCC)   . Thoracic aorta atherosclerosis [facility])     Past Surgical History:  Procedure Laterality Date  . CARDIAC CATHETERIZATION N/A 02/26/2016   Procedure: Left Heart Cath and Coronary Angiography;  Surgeon: Iran Ouch, MD;  Location: [facility] INVASIVE CV LAB;  Service: Cardiovascular;  Laterality: N/A;  . CARDIAC CATHETERIZATION N/A 02/26/2016   Procedure: Coronary Stent Intervention;  Surgeon: Iran Ouch, MD;  Location: [facility] INVASIVE CV LAB;  Service: Cardiovascular;  Laterality: N/A;  . CORONARY STENT PLACEMENT    . ESOPHAGOGASTRODUODENOSCOPY    .  UMBILICAL HERNIA REPAIR      Social History   Socioeconomic History  . Marital status: Married    Spouse name: Not on file  . Number of children: Not on file  . Years of education: Not on file  . Highest education level: Not on file  Occupational History  . Not on file  Tobacco Use  . Smoking status: Former Smoker    Packs/day: 0.10    Years: 47.00    Pack years: 4.70    Types: Cigarettes    Quit date: 09/24/2013    Years since quitting: 6.6  . Smokeless tobacco: Current User    Types: Chew  Vaping Use  . Vaping Use: Never used  Substance  and Sexual Activity  . Alcohol use: Yes    Alcohol/week: 2.0 standard drinks    Types: 2 Cans of beer per week    Comment: occ  . Drug use: No  . Sexual activity: Not on file  Other Topics Concern  . Not on file  Social History Narrative   No living will    Would want wife, then son, would be health care POA   Would accept resuscitation attempts--but no prolonged artificial ventilation   Not sure about tube feeds   Social Determinants of Health   Financial Resource Strain: Not on file  Food Insecurity: Not on file  Transportation Needs: Not on file  Physical Activity: Not on file  Stress: Not on file  Social Connections: Not on file  Intimate Partner Violence: Not on file    Family History  Problem Relation Age of Onset  . Heart disease Father 6       Before age 77- Open Heart surgery  . Hyperlipidemia Mother     ROS: no fevers or chills, productive cough, hemoptysis, dysphasia, odynophagia, melena, hematochezia, dysuria, hematuria, rash, seizure activity, orthopnea, PND, pedal edema, claudication. Remaining systems are negative.  Physical Exam: Well-developed well-nourished in no acute distress.  Skin is warm and dry.  HEENT is normal.  Neck is supple.  Chest is clear to auscultation with normal expansion.  Cardiovascular exam is regular rate and rhythm.  Abdominal exam nontender or distended. No masses palpated. Extremities show no edema. neuro grossly intact  ECG- personally reviewed  A/P  1 coronary artery disease-patient denies chest pain.  Continue medical therapy with aspirin and statin.  2 hypertension-patient's blood pressure is controlled.  Continue present medications and follow.  Check potassium and renal function.  3 hyperlipidemia-continue statin.  Check lipids and liver.  4 carotid artery disease-previously followed by vascular surgery.  We will arrange follow-up carotid Dopplers.  Olga Millers, MD

## 2020-05-15 ENCOUNTER — Ambulatory Visit: Payer: PPO | Admitting: Cardiology

## 2020-05-15 ENCOUNTER — Encounter: Payer: Self-pay | Admitting: Internal Medicine

## 2020-05-15 ENCOUNTER — Other Ambulatory Visit: Payer: Self-pay

## 2020-05-15 ENCOUNTER — Ambulatory Visit (INDEPENDENT_AMBULATORY_CARE_PROVIDER_SITE_OTHER): Payer: PPO | Admitting: Internal Medicine

## 2020-05-15 DIAGNOSIS — R1032 Left lower quadrant pain: Secondary | ICD-10-CM | POA: Insufficient documentation
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
## 2020-05-15 NOTE — Assessment & Plan Note (Signed)
No clear hernia noted May have just had injury without true rupture He is being careful now with lifting, etc Can reevaluate it the next time he comes in

## 2020-05-15 NOTE — Progress Notes (Signed)
HPI: FU coronary disease. He underwent cardiac catheterization in 2007 and had a bare-metal stent to his LAD.Patient had repeat catheterization November 2017 and was found to have an 80% posterior lateral,significant RCA disease and ejection fraction 55-65%. Patient had 3 drug-eluting stents placed in the right coronary artery at that time.Abdominal ultrasound May 2018 showed no aneurysm. Nuclear study January 2020 showed ejection fraction 62% with no ischemia or infarction.  Also with cerebrovascular disease followed by vascular surgery.Since last seen,there is no dyspnea, chest pain, palpitations or syncope.  Current Outpatient Medications  Medication Sig Dispense Refill  . aspirin EC 81 MG tablet Take 1 tablet (81 mg total) by mouth daily. 90 tablet 3  . metoprolol tartrate (LOPRESSOR) 25 MG tablet Take 0.5 tablets (12.5 mg total) by mouth 2 (two) times daily. NEED OV FOR FUTURE REFILL. 180 tablet 1  . Multiple Vitamin (MULTIVITAMIN WITH MINERALS) TABS tablet Take 1 tablet by mouth daily.    . nitroGLYCERIN (NITROSTAT) 0.4 MG SL tablet Place 1 tablet (0.4 mg total) under the tongue every 5 (five) minutes as needed for chest pain. 25 tablet 2  . pantoprazole (PROTONIX) 40 MG tablet TAKE 1 TABLET(40 MG) BY MOUTH DAILY 90 tablet 3  . rosuvastatin (CRESTOR) 40 MG tablet TAKE 1 TABLET(40 MG) BY MOUTH DAILY 90 tablet 3   No current facility-administered medications for this visit.     Past Medical History:  Diagnosis Date  . BARRETTS ESOPHAGUS   . Carotid artery occlusion   . COLONIC POLYPS, HX OF   . COPD   . CORONARY ARTERY DISEASE    a. 2007: cath showing 95% stenosis proximal LAD (BMS placed), 40% D1, and 75% RCA stenosis. b. NST 08/2008: no evidence of ischemia c. NST 04/2012: no evidence of ischemia.  Marland Kitchen GERD   . GLAUCOMA   . HYPERCHOLESTEROLEMIA   . HYPERLIPIDEMIA   . NSTEMI (non-ST elevated myocardial infarction) (HCC) 02/25/2016  . Other constipation   . OTITIS  EXTERNA   . Peripheral vascular disease (HCC)   . Thoracic aorta atherosclerosis [facility])     Past Surgical History:  Procedure Laterality Date  . CARDIAC CATHETERIZATION N/A 02/26/2016   Procedure: Left Heart Cath and Coronary Angiography;  Surgeon: Iran Ouch, MD;  Location: [facility] INVASIVE CV LAB;  Service: Cardiovascular;  Laterality: N/A;  . CARDIAC CATHETERIZATION N/A 02/26/2016   Procedure: Coronary Stent Intervention;  Surgeon: Iran Ouch, MD;  Location: [facility] INVASIVE CV LAB;  Service: Cardiovascular;  Laterality: N/A;  . CORONARY STENT PLACEMENT    . ESOPHAGOGASTRODUODENOSCOPY    . UMBILICAL HERNIA REPAIR      Social History   Socioeconomic History  . Marital status: Married    Spouse name: Not on file  . Number of children: Not on file  . Years of education: Not on file  . Highest education level: Not on file  Occupational History  . Not on file  Tobacco Use  . Smoking status: Former Smoker    Packs/day: 0.10    Years: 47.00    Pack years: 4.70    Types: Cigarettes    Quit date: 09/24/2013    Years since quitting: 6.6  . Smokeless tobacco: Current User    Types: Chew  Vaping Use  . Vaping Use: Never used  Substance and Sexual Activity  . Alcohol use: Yes    Alcohol/week: 2.0 standard drinks    Types: 2 Cans of beer per week    Comment: occ  .  Drug use: No  . Sexual activity: Not on file  Other Topics Concern  . Not on file  Social History Narrative   No living will    Would want wife, then son, would be health care POA   Would accept resuscitation attempts--but no prolonged artificial ventilation   Not sure about tube feeds   Social Determinants of Health   Financial Resource Strain: Not on file  Food Insecurity: Not on file  Transportation Needs: Not on file  Physical Activity: Not on file  Stress: Not on file  Social Connections: Not on file  Intimate Partner Violence: Not on file    Family History  Problem Relation Age of Onset  .  Heart disease Father 41       Before age 91- Open Heart surgery  . Hyperlipidemia Mother     ROS: no fevers or chills, productive cough, hemoptysis, dysphasia, odynophagia, melena, hematochezia, dysuria, hematuria, rash, seizure activity, orthopnea, PND, pedal edema, claudication. Remaining systems are negative.  Physical Exam: Well-developed well-nourished in no acute distress.  Skin is warm and dry.  HEENT is normal.  Neck is supple.  Bilateral carotid bruits Chest is clear to auscultation with normal expansion.  Cardiovascular exam is regular rate and rhythm.  Abdominal exam nontender or distended. No masses palpated. Extremities show no edema. neuro grossly intact  A/P  1 CAD-no CP; continue ASA and statin.  2 Htn-BP controlled; continue present meds and follow.  3 hyperlipidemia-continue statin.  4 carotid artery disease-followed by vascular surgery; continue medical therapy.  5 tobacco abuse-patient counseled on discontinuing.  Olga Millers, MD

## 2020-05-18 ENCOUNTER — Other Ambulatory Visit: Payer: Self-pay

## 2020-05-18 ENCOUNTER — Ambulatory Visit: Payer: PPO | Admitting: Cardiology

## 2020-05-18 ENCOUNTER — Encounter: Payer: Self-pay | Admitting: Cardiology

## 2020-05-18 VITALS — BP 142/58 | HR 89 | Ht 71.0 in | Wt 172.6 lb

## 2020-05-18 DIAGNOSIS — I10 Essential (primary) hypertension: Secondary | ICD-10-CM

## 2020-05-18 DIAGNOSIS — E785 Hyperlipidemia, unspecified: Secondary | ICD-10-CM | POA: Diagnosis not present

## 2020-05-18 DIAGNOSIS — I2510 Atherosclerotic heart disease of native coronary artery without angina pectoris: Secondary | ICD-10-CM | POA: Diagnosis not present

## 2020-05-18 DIAGNOSIS — Z9861 Coronary angioplasty status: Secondary | ICD-10-CM

## 2020-05-18 DIAGNOSIS — I6523 Occlusion and stenosis of bilateral carotid arteries: Secondary | ICD-10-CM | POA: Diagnosis not present

## 2020-05-18 NOTE — Patient Instructions (Signed)

## 2020-05-27 ENCOUNTER — Telehealth: Payer: Self-pay | Admitting: Cardiology

## 2020-05-27 NOTE — Telephone Encounter (Signed)
*  STAT* If patient is at the pharmacy, call can be transferred to refill team.   1. Which medications need to be refilled? (please list name of each medication and dose if known)  metoprolol tartrate (LOPRESSOR) 25 MG tablet   2. Which pharmacy/location (including street and city if local pharmacy) is medication to be sent to? [id number] - [place], [place] - [address] AT [address] OF [address]  3. Do they need a 30 day or 90 day supply? 90   Patient is out of this medication and has been seen by Dr. Jens Som on 05/18/2020. Please advise.

## 2020-05-28 MED ORDER — METOPROLOL TARTRATE 25 MG PO TABS
12.5000 mg | ORAL_TABLET | Freq: Two times a day (BID) | ORAL | 1 refills | Status: DC
Start: 2020-05-28 — End: 2020-11-18

## 2020-05-28 NOTE — Addendum Note (Signed)
Addended by: Raelyn Number on: 05/28/2020 02:46 PM   Modules accepted: Orders

## 2020-05-28 NOTE — Telephone Encounter (Signed)
Let patient know that a refill was sent in to the pharmacy for Metoprolol.

## 2020-05-28 NOTE — Telephone Encounter (Signed)
Pt wife called in and wanted to check on the status of this refill.  Pt is completely out of meds .

## 2020-05-29 ENCOUNTER — Other Ambulatory Visit: Payer: Self-pay

## 2020-06-01 ENCOUNTER — Ambulatory Visit (INDEPENDENT_AMBULATORY_CARE_PROVIDER_SITE_OTHER): Payer: PPO | Admitting: Internal Medicine

## 2020-06-01 ENCOUNTER — Encounter: Payer: Self-pay | Admitting: Internal Medicine

## 2020-06-01 ENCOUNTER — Other Ambulatory Visit: Payer: Self-pay

## 2020-06-01 DIAGNOSIS — K4090 Unilateral inguinal hernia, without obstruction or gangrene, not specified as recurrent: Secondary | ICD-10-CM | POA: Diagnosis not present

## 2020-06-01 NOTE — Assessment & Plan Note (Signed)
Now clearly has a hernia Small and reducible He is not in a rush to take action He will use the velcro brace as a truss when he is lifting (like moving wood)  Discussed Will set him up with surgeon if any worsening symptoms

## 2020-06-01 NOTE — Progress Notes (Signed)
Subjective:    Patient ID: TRES GRZYWACZ, male    DOB: 1950-10-02, 70 y.o.   MRN: [id number]  HPI Here due to ongoing groin pain This visit occurred during the SARS-CoV-2 public health emergency.  Safety protocols were in place, including screening questions prior to the visit, additional usage of staff PPE, and extensive cleaning of exam room while observing appropriate contact time as indicated for disinfecting solutions.   Was lifting something 3-4 days ago Pain got worse and he noticed bulging Tried back brace as a truss--seemed to help  Notices it when coughing  Current Outpatient Medications on File Prior to Visit  Medication Sig Dispense Refill  . aspirin EC 81 MG tablet Take 1 tablet (81 mg total) by mouth daily. 90 tablet 3  . metoprolol tartrate (LOPRESSOR) 25 MG tablet Take 0.5 tablets (12.5 mg total) by mouth 2 (two) times daily. 90 tablet 1  . Multiple Vitamin (MULTIVITAMIN WITH MINERALS) TABS tablet Take 1 tablet by mouth daily.    . nitroGLYCERIN (NITROSTAT) 0.4 MG SL tablet Place 1 tablet (0.4 mg total) under the tongue every 5 (five) minutes as needed for chest pain. 25 tablet 2  . pantoprazole (PROTONIX) 40 MG tablet TAKE 1 TABLET(40 MG) BY MOUTH DAILY 90 tablet 3  . rosuvastatin (CRESTOR) 40 MG tablet TAKE 1 TABLET(40 MG) BY MOUTH DAILY 90 tablet 3   No current facility-administered medications on file prior to visit.    No Known Allergies  Past Medical History:  Diagnosis Date  . BARRETTS ESOPHAGUS   . Carotid artery occlusion   . COLONIC POLYPS, HX OF   . COPD   . CORONARY ARTERY DISEASE    a. 2007: cath showing 95% stenosis proximal LAD (BMS placed), 40% D1, and 75% RCA stenosis. b. NST 08/2008: no evidence of ischemia c. NST 04/2012: no evidence of ischemia.  Marland Kitchen GERD   . GLAUCOMA   . HYPERCHOLESTEROLEMIA   . HYPERLIPIDEMIA   . NSTEMI (non-ST elevated myocardial infarction) (HCC) 02/25/2016  . Other constipation   . OTITIS EXTERNA   . Peripheral  vascular disease (HCC)   . Thoracic aorta atherosclerosis [facility])     Past Surgical History:  Procedure Laterality Date  . CARDIAC CATHETERIZATION N/A 02/26/2016   Procedure: Left Heart Cath and Coronary Angiography;  Surgeon: Iran Ouch, MD;  Location: [facility] INVASIVE CV LAB;  Service: Cardiovascular;  Laterality: N/A;  . CARDIAC CATHETERIZATION N/A 02/26/2016   Procedure: Coronary Stent Intervention;  Surgeon: Iran Ouch, MD;  Location: [facility] INVASIVE CV LAB;  Service: Cardiovascular;  Laterality: N/A;  . CORONARY STENT PLACEMENT    . ESOPHAGOGASTRODUODENOSCOPY    . UMBILICAL HERNIA REPAIR      Family History  Problem Relation Age of Onset  . Heart disease Father 4       Before age 31- Open Heart surgery  . Hyperlipidemia Mother     Social History   Socioeconomic History  . Marital status: Married    Spouse name: Not on file  . Number of children: Not on file  . Years of education: Not on file  . Highest education level: Not on file  Occupational History  . Not on file  Tobacco Use  . Smoking status: Former Smoker    Packs/day: 0.10    Years: 47.00    Pack years: 4.70    Types: Cigarettes    Quit date: 09/24/2013    Years since quitting: 6.6  . Smokeless tobacco: Current User  Types: Chew  Vaping Use  . Vaping Use: Never used  Substance and Sexual Activity  . Alcohol use: Yes    Alcohol/week: 2.0 standard drinks    Types: 2 Cans of beer per week    Comment: occ  . Drug use: No  . Sexual activity: Not on file  Other Topics Concern  . Not on file  Social History Narrative   No living will    Would want wife, then son, would be health care POA   Would accept resuscitation attempts--but no prolonged artificial ventilation   Not sure about tube feeds   Social Determinants of Health   Financial Resource Strain: Not on file  Food Insecurity: Not on file  Transportation Needs: Not on file  Physical Activity: Not on file  Stress: Not on file  Social  Connections: Not on file  Intimate Partner Violence: Not on file   Review of Systems Voids okay No constipation    Objective:   Physical Exam Abdominal:     Comments: Now has reducible hernia at the left inguinal ring Nothing into scrotum Not tender            Assessment & Plan:

## 2020-06-22 ENCOUNTER — Telehealth: Payer: Self-pay | Admitting: Internal Medicine

## 2020-06-22 DIAGNOSIS — K4090 Unilateral inguinal hernia, without obstruction or gangrene, not specified as recurrent: Secondary | ICD-10-CM

## 2020-06-22 NOTE — Telephone Encounter (Signed)
Left VM for wife with Dr Karle Starch message.

## 2020-06-22 NOTE — Telephone Encounter (Signed)
Pt wife called in wans told if he wanted to have the surgery he would just need to call back and Dr. Alphonsus Sias would set it up. He wants to have the surgery

## 2020-06-22 NOTE — Telephone Encounter (Signed)
Please let her know that I put in the referral and she should hear in the next few days [facility] in [place])

## 2020-06-29 ENCOUNTER — Other Ambulatory Visit: Payer: Self-pay

## 2020-06-29 MED ORDER — PANTOPRAZOLE SODIUM 40 MG PO TBEC: DELAYED_RELEASE_TABLET | ORAL | 3 refills | Status: DC

## 2020-07-09 ENCOUNTER — Other Ambulatory Visit: Payer: Self-pay | Admitting: Cardiology

## 2020-07-09 DIAGNOSIS — I2510 Atherosclerotic heart disease of native coronary artery without angina pectoris: Secondary | ICD-10-CM

## 2020-07-16 DIAGNOSIS — K4090 Unilateral inguinal hernia, without obstruction or gangrene, not specified as recurrent: Secondary | ICD-10-CM | POA: Diagnosis not present

## 2020-08-20 ENCOUNTER — Ambulatory Visit: Payer: Self-pay | Admitting: Surgery

## 2020-08-20 NOTE — Progress Notes (Addendum)
DUE TO COVID-19 ONLY ONE VISITOR IS ALLOWED TO COME WITH YOU AND STAY IN THE WAITING ROOM ONLY DURING PRE OP AND PROCEDURE DAY OF SURGERY. THE 1 VISITOR  MAY VISIT WITH YOU AFTER SURGERY IN YOUR PRIVATE ROOM DURING VISITING HOURS ONLY!  YOU NEED TO HAVE A COVID 19 TEST ON__5/08/2020 _____ @_1000am  ____, THIS TEST MUST BE DONE BEFORE SURGERY,  COVID TESTING SITE [address] [place] , IT IS ON THE RIGHT GOING OUT [address] APPROXIMATELY  2 MINUTES PAST [employer] ON THE RIGHT. ONCE YOUR COVID TEST IS COMPLETED,  PLEASE BEGIN THE QUARANTINE INSTRUCTIONS AS OUTLINED IN YOUR HANDOUT.                [name] [name]  08/20/2020   Your procedure is scheduled on:  08/31/20  Report to [facility] Main  Entrance   Report to admitting at     0800 AM     Call this number if you have problems the morning of surgery [phone]    Remember: Do not eat food , candy gum or mints :After Midnight. You may have clear liquids from midnight until 0700am  Please complete Ensure preop drink by 0700am morning of surgery.     CLEAR LIQUID DIET   Foods Allowed                                                                       Coffee and tea, regular and decaf                              Plain Jell-O any favor except red or purple                                            Fruit ices (not with fruit pulp)                                      Iced Popsicles                                     Carbonated beverages, regular and diet                                    Cranberry, grape and apple juices Sports drinks like Gatorade Lightly seasoned clear broth or consume(fat free) Sugar, honey syrup   _____________________________________________________________________    BRUSH YOUR TEETH MORNING OF SURGERY AND RINSE YOUR MOUTH OUT, NO CHEWING GUM CANDY OR MINTS.     Take these medicines the morning of surgery with A SIP OF WATER:     Metoprolol, protonix  DO  NOT TAKE ANY DIABETIC MEDICATIONS DAY OF YOUR SURGERY  You may not have any metal on your body including hair pins and              piercings  Do not wear jewelry, make-up, lotions, powders or perfumes, deodorant             Do not wear nail polish on your fingernails.  Do not shave  48 hours prior to surgery.              Men may shave face and neck.   Do not bring valuables to the hospital. [place].  Contacts, dentures or bridgework may not be worn into surgery.  Leave suitcase in the car. After surgery it may be brought to your room.     Patients discharged the day of surgery will not be allowed to drive home. IF YOU ARE HAVING SURGERY AND GOING HOME THE SAME DAY, YOU MUST HAVE AN ADULT TO DRIVE YOU HOME AND BE WITH YOU FOR 24 HOURS. YOU MAY GO HOME BY TAXI OR UBER OR ORTHERWISE, BUT AN ADULT MUST ACCOMPANY YOU HOME AND STAY WITH YOU FOR 24 HOURS.  Name and phone number of your driver:  Special Instructions: N/A              Please read over the following fact sheets you were given: _____________________________________________________________________  [facility] - Preparing for Surgery Before surgery, you can play an important role.  Because skin is not sterile, your skin needs to be as free of germs as possible.  You can reduce the number of germs on your skin by washing with CHG (chlorahexidine gluconate) soap before surgery.  CHG is an antiseptic cleaner which kills germs and bonds with the skin to continue killing germs even after washing. Please DO NOT use if you have an allergy to CHG or antibacterial soaps.  If your skin becomes reddened/irritated stop using the CHG and inform your nurse when you arrive at Short Stay. Do not shave (including legs and underarms) for at least 48 hours prior to the first CHG shower.  You may shave your face/neck. Please follow these instructions carefully:  1.  Shower  with CHG Soap the night before surgery and the  morning of Surgery.  2.  If you choose to wash your hair, wash your hair first as usual with your  normal  shampoo.  3.  After you shampoo, rinse your hair and body thoroughly to remove the  shampoo.                           4.  Use CHG as you would any other liquid soap.  You can apply chg directly  to the skin and wash                       Gently with a scrungie or clean washcloth.  5.  Apply the CHG Soap to your body ONLY FROM THE NECK DOWN.   Do not use on face/ open                           Wound or open sores. Avoid contact with eyes, ears mouth and genitals (private parts).  Wash face,  Genitals (private parts) with your normal soap.             6.  Wash thoroughly, paying special attention to the area where your surgery  will be performed.  7.  Thoroughly rinse your body with warm water from the neck down.  8.  DO NOT shower/wash with your normal soap after using and rinsing off  the CHG Soap.                9.  Pat yourself dry with a clean towel.            10.  Wear clean pajamas.            11.  Place clean sheets on your bed the night of your first shower and do not  sleep with pets. Day of Surgery : Do not apply any lotions/deodorants the morning of surgery.  Please wear clean clothes to the hospital/surgery center.  FAILURE TO FOLLOW THESE INSTRUCTIONS MAY RESULT IN THE CANCELLATION OF YOUR SURGERY PATIENT SIGNATURE_________________________________  NURSE SIGNATURE__________________________________  ________________________________________________________________________

## 2020-08-20 NOTE — Progress Notes (Signed)
Sent message, via epic in basket, requesting orders in epic from surgeon.  

## 2020-08-21 ENCOUNTER — Encounter (HOSPITAL_COMMUNITY)
Admission: RE | Admit: 2020-08-21 | Discharge: 2020-08-21 | Disposition: A | Discharge status: Home / Self Care | Payer: PPO | Source: Ambulatory Visit | Attending: Surgery | Admitting: Surgery

## 2020-08-21 ENCOUNTER — Encounter (HOSPITAL_COMMUNITY): Payer: Self-pay

## 2020-08-21 ENCOUNTER — Other Ambulatory Visit: Payer: Self-pay

## 2020-08-21 DIAGNOSIS — I2510 Atherosclerotic heart disease of native coronary artery without angina pectoris: Secondary | ICD-10-CM | POA: Insufficient documentation

## 2020-08-21 DIAGNOSIS — Z01812 Encounter for preprocedural laboratory examination: Secondary | ICD-10-CM | POA: Diagnosis not present

## 2020-08-21 DIAGNOSIS — K4090 Unilateral inguinal hernia, without obstruction or gangrene, not specified as recurrent: Secondary | ICD-10-CM | POA: Insufficient documentation

## 2020-08-21 DIAGNOSIS — K219 Gastro-esophageal reflux disease without esophagitis: Secondary | ICD-10-CM | POA: Diagnosis not present

## 2020-08-21 DIAGNOSIS — Z79899 Other long term (current) drug therapy: Secondary | ICD-10-CM | POA: Insufficient documentation

## 2020-08-21 DIAGNOSIS — Z955 Presence of coronary angioplasty implant and graft: Secondary | ICD-10-CM | POA: Insufficient documentation

## 2020-08-21 DIAGNOSIS — I10 Essential (primary) hypertension: Secondary | ICD-10-CM | POA: Insufficient documentation

## 2020-08-21 DIAGNOSIS — Z7982 Long term (current) use of aspirin: Secondary | ICD-10-CM | POA: Insufficient documentation

## 2020-08-21 DIAGNOSIS — J449 Chronic obstructive pulmonary disease, unspecified: Secondary | ICD-10-CM | POA: Insufficient documentation

## 2020-08-21 DIAGNOSIS — F17210 Nicotine dependence, cigarettes, uncomplicated: Secondary | ICD-10-CM | POA: Insufficient documentation

## 2020-08-21 DIAGNOSIS — I493 Ventricular premature depolarization: Secondary | ICD-10-CM | POA: Diagnosis not present

## 2020-08-21 LAB — BASIC METABOLIC PANEL
Anion gap: 5 (ref 5–15)
BUN: 15 mg/dL (ref 8–23)
CO2: 29 mmol/L (ref 22–32)
Calcium: 9.4 mg/dL (ref 8.9–10.3)
Chloride: 107 mmol/L (ref 98–111)
Creatinine, Ser: 1.07 mg/dL (ref 0.61–1.24)
GFR, Estimated: >60 mL/min (ref >60)
Glucose, Bld: 102 mg/dL — ABNORMAL HIGH (ref 70–99)
Potassium: 5.2 mmol/L — ABNORMAL HIGH (ref 3.5–5.1)
Sodium: 141 mmol/L (ref 135–145)

## 2020-08-21 LAB — CBC
HCT: 46 % (ref 39.0–52.0)
Hemoglobin: 15.1 g/dL (ref 13.0–17.0)
MCH: 30.4 pg (ref 26.0–34.0)
MCHC: 32.8 g/dL (ref 30.0–36.0)
MCV: 92.6 fL (ref 80.0–100.0)
Platelets: 257 10*3/uL (ref 150–400)
RBC: 4.97 MIL/uL (ref 4.22–5.81)
RDW: 12.5 % (ref 11.5–15.5)
WBC: 9.4 10*3/uL (ref 4.0–10.5)
nRBC: 0 % (ref 0.0–0.2)

## 2020-08-21 NOTE — Progress Notes (Addendum)
Anesthesia Review:  PCP: DR Alphonsus Sias- LOV 06/03/20 Cardiologist : DR Olga Millers- LOV 05/18/20 Chest x-ray : EKG :01/01/2020  Echo : Stress test: 2020  Cardiac Cath : 2017  Activity level:  Can do a flight of stairs without difficulty  Sleep Study/ CPAP : none  Fasting Blood Sugar :      / Checks Blood Sugar -- times a day:   Blood Thinner/ Instructions /Last Dose: ASA / Instructions/ Last Dose :  81 mg Aspirin  Potassium of 5.2 at preop of 08/21/20.  Routed to Dr Daphine Deutscher.Made Sheilah Mins aware.

## 2020-08-24 NOTE — Progress Notes (Signed)
Anesthesia Chart Review   Case: [id number] Date/Time: 08/31/20 0945   Procedure: XI ROBOTIC ASSISTED LEFT INGUINAL HERNIA REPAIR TAPP (Left )   Anesthesia type: General   Pre-op diagnosis: LEFT INGUINAL HERNIA   Location[facility] ROOM 02 / [facility]   Surgeons: Luretha Murphy, MD      DISCUSSION:70 y.o. current every day smoker with h/o GERD, COPD, CAD (bare metal stent LAD 2007, DES 2017), PVD, left inguinal hernia scheduled for above procedure 08/31/2020 with Dr. Luretha Murphy.   Pt last seen by cardiology 05/18/2020. Stable at this visit, no cv sx, HTN well controlled. Low risk stress test 05/25/2018.   VS: BP 137/73   Pulse 79   Temp 36.8 C (Oral)   Resp 16   Ht 5\' 11"  (1.803 m)   Wt 80.3 kg   SpO2 100%   BMI 24.69 kg/m   PROVIDERS: , MD is PCP   Karie Schwalbe, MD is Cardiologist  LABS: Labs reviewed: Acceptable for surgery. (all labs ordered are listed, but only abnormal results are displayed)  Labs Reviewed  BASIC METABOLIC PANEL - Abnormal; Notable for the following components:      Result Value   Potassium 5.2 (*)    Glucose, Bld 102 (*)    All other components within normal limits  CBC     IMAGES:   EKG: 01/01/2020 Rate 64 bpm  Sinus rhythm   CV: Stress Test 05/25/2018  The left ventricular ejection fraction is normal (55-65%).  Nuclear stress EF: 62%.  Blood pressure demonstrated a hypertensive response to exercise.  There was no ST segment deviation noted during stress.  The study is normal.  This is a low risk study.   Normal exercise nuclear stress test with no evidence for prior infarct or ischemia. Hypertensive response to exercise.   Past Medical History:  Diagnosis Date  . BARRETTS ESOPHAGUS   . Carotid artery occlusion   . COLONIC POLYPS, HX OF   . COPD    pt denies   . CORONARY ARTERY DISEASE    a. 2007: cath showing 95% stenosis proximal LAD (BMS placed), 40% D1, and 75% RCA stenosis. b. NST 08/2008: no evidence of  ischemia c. NST 04/2012: no evidence of ischemia.  05/2012 GERD   . GLAUCOMA   . HYPERCHOLESTEROLEMIA   . HYPERLIPIDEMIA   . NSTEMI (non-ST elevated myocardial infarction) (HCC) 02/25/2016  . Other constipation   . OTITIS EXTERNA   . Peripheral vascular disease (HCC)    pt denies   . Thoracic aorta atherosclerosis [facility])     Past Surgical History:  Procedure Laterality Date  . CARDIAC CATHETERIZATION N/A 02/26/2016   Procedure: Left Heart Cath and Coronary Angiography;  Surgeon: 13/06/2015, MD;  Location: [facility] INVASIVE CV LAB;  Service: Cardiovascular;  Laterality: N/A;  . CARDIAC CATHETERIZATION N/A 02/26/2016   Procedure: Coronary Stent Intervention;  Surgeon: 13/06/2015, MD;  Location: [facility] INVASIVE CV LAB;  Service: Cardiovascular;  Laterality: N/A;  . CORONARY STENT PLACEMENT    . ESOPHAGOGASTRODUODENOSCOPY    . UMBILICAL HERNIA REPAIR      MEDICATIONS: . aspirin EC 81 MG tablet  . latanoprost (XALATAN) 0.005 % ophthalmic solution  . metoprolol tartrate (LOPRESSOR) 25 MG tablet  . nitroGLYCERIN (NITROSTAT) 0.4 MG SL tablet  . pantoprazole (PROTONIX) 40 MG tablet  . rosuvastatin (CRESTOR) 40 MG tablet   No current facility-administered medications for this encounter.    Iran Ouch, PA-C [facility] Pre-Surgical Testing [phone]

## 2020-08-27 ENCOUNTER — Other Ambulatory Visit (HOSPITAL_COMMUNITY)
Admission: RE | Admit: 2020-08-27 | Discharge: 2020-08-27 | Disposition: A | Discharge status: Home / Self Care | Payer: PPO | Source: Ambulatory Visit | Attending: Surgery | Admitting: Surgery

## 2020-08-27 DIAGNOSIS — Z01812 Encounter for preprocedural laboratory examination: Secondary | ICD-10-CM | POA: Insufficient documentation

## 2020-08-27 DIAGNOSIS — Z20822 Contact with and (suspected) exposure to covid-19: Secondary | ICD-10-CM | POA: Diagnosis not present

## 2020-08-28 LAB — SARS CORONAVIRUS 2 (TAT 6-24 HRS): SARS Coronavirus 2: NEGATIVE

## 2020-08-30 MED ORDER — BUPIVACAINE LIPOSOME 1.3 % IJ SUSP
20.0000 mL | INTRAMUSCULAR | Status: DC
  Filled 2020-08-30: qty 20

## 2020-08-31 ENCOUNTER — Other Ambulatory Visit: Payer: Self-pay

## 2020-08-31 ENCOUNTER — Ambulatory Visit (HOSPITAL_COMMUNITY): Payer: PPO | Admitting: Anesthesiology

## 2020-08-31 ENCOUNTER — Ambulatory Visit (HOSPITAL_COMMUNITY): Payer: PPO | Admitting: Physician Assistant

## 2020-08-31 ENCOUNTER — Observation Stay (HOSPITAL_COMMUNITY)
Admission: RE | Admit: 2020-08-31 | Discharge: 2020-09-01 | Disposition: A | Discharge status: Home / Self Care | Payer: PPO | Attending: Surgery | Admitting: Surgery

## 2020-08-31 ENCOUNTER — Encounter (HOSPITAL_COMMUNITY)
Admission: RE | Disposition: A | Discharge status: Home / Self Care | Payer: Self-pay | Source: Home / Self Care | Attending: Surgery

## 2020-08-31 ENCOUNTER — Encounter (HOSPITAL_COMMUNITY): Payer: Self-pay | Admitting: Surgery

## 2020-08-31 DIAGNOSIS — J449 Chronic obstructive pulmonary disease, unspecified: Secondary | ICD-10-CM | POA: Insufficient documentation

## 2020-08-31 DIAGNOSIS — Z955 Presence of coronary angioplasty implant and graft: Secondary | ICD-10-CM | POA: Insufficient documentation

## 2020-08-31 DIAGNOSIS — F17210 Nicotine dependence, cigarettes, uncomplicated: Secondary | ICD-10-CM | POA: Diagnosis not present

## 2020-08-31 DIAGNOSIS — Z7982 Long term (current) use of aspirin: Secondary | ICD-10-CM | POA: Insufficient documentation

## 2020-08-31 DIAGNOSIS — I2510 Atherosclerotic heart disease of native coronary artery without angina pectoris: Secondary | ICD-10-CM | POA: Insufficient documentation

## 2020-08-31 DIAGNOSIS — E7800 Pure hypercholesterolemia, unspecified: Secondary | ICD-10-CM | POA: Diagnosis not present

## 2020-08-31 DIAGNOSIS — K4091 Unilateral inguinal hernia, without obstruction or gangrene, recurrent: Secondary | ICD-10-CM | POA: Diagnosis not present

## 2020-08-31 DIAGNOSIS — Z9889 Other specified postprocedural states: Secondary | ICD-10-CM

## 2020-08-31 DIAGNOSIS — I10 Essential (primary) hypertension: Secondary | ICD-10-CM | POA: Diagnosis not present

## 2020-08-31 DIAGNOSIS — K4090 Unilateral inguinal hernia, without obstruction or gangrene, not specified as recurrent: Secondary | ICD-10-CM | POA: Diagnosis not present

## 2020-08-31 DIAGNOSIS — Z8719 Personal history of other diseases of the digestive system: Secondary | ICD-10-CM

## 2020-08-31 DIAGNOSIS — K219 Gastro-esophageal reflux disease without esophagitis: Secondary | ICD-10-CM | POA: Diagnosis not present

## 2020-08-31 HISTORY — PX: XI ROBOTIC ASSISTED INGUINAL HERNIA REPAIR WITH MESH: SHX6706

## 2020-08-31 LAB — CBC
HCT: 44.7 % (ref 39.0–52.0)
Hemoglobin: 14.3 g/dL (ref 13.0–17.0)
MCH: 30.3 pg (ref 26.0–34.0)
MCHC: 32 g/dL (ref 30.0–36.0)
MCV: 94.7 fL (ref 80.0–100.0)
Platelets: 234 10*3/uL (ref 150–400)
RBC: 4.72 MIL/uL (ref 4.22–5.81)
RDW: 12.4 % (ref 11.5–15.5)
WBC: 19.3 10*3/uL — ABNORMAL HIGH (ref 4.0–10.5)
nRBC: 0 % (ref 0.0–0.2)

## 2020-08-31 LAB — CREATININE, SERUM
Creatinine, Ser: 1.26 mg/dL — ABNORMAL HIGH (ref 0.61–1.24)
GFR, Estimated: >60 mL/min (ref >60)

## 2020-08-31 SURGERY — REPAIR, HERNIA, INGUINAL, ROBOT-ASSISTED, LAPAROSCOPIC, USING MESH
Anesthesia: General | Site: Abdomen | Laterality: Left

## 2020-08-31 MED ORDER — ONDANSETRON HCL 4 MG/2ML IJ SOLN
INTRAMUSCULAR | Status: DC | PRN
  Administered 2020-08-31: 4 mg via INTRAVENOUS

## 2020-08-31 MED ORDER — CHLORHEXIDINE GLUCONATE CLOTH 2 % EX PADS: 6.0000 | MEDICATED_PAD | Freq: Once | CUTANEOUS | Status: DC

## 2020-08-31 MED ORDER — FENTANYL CITRATE (PF) 250 MCG/5ML IJ SOLN
INTRAMUSCULAR | Status: DC | PRN
  Administered 2020-08-31 (×7): 50 ug via INTRAVENOUS
  Administered 2020-08-31: 100 ug via INTRAVENOUS

## 2020-08-31 MED ORDER — LACTATED RINGERS IV SOLN: INTRAVENOUS | Status: DC

## 2020-08-31 MED ORDER — FENTANYL CITRATE (PF) 100 MCG/2ML IJ SOLN
INTRAMUSCULAR | Status: AC
  Filled 2020-08-31: qty 2

## 2020-08-31 MED ORDER — LIDOCAINE 2% (20 MG/ML) 5 ML SYRINGE
INTRAMUSCULAR | Status: DC | PRN
  Administered 2020-08-31: 80 mg via INTRAVENOUS

## 2020-08-31 MED ORDER — METOPROLOL TARTRATE 12.5 MG HALF TABLET
12.5000 mg | ORAL_TABLET | Freq: Two times a day (BID) | ORAL | Status: DC
  Administered 2020-08-31: 12.5 mg via ORAL
  Filled 2020-08-31: qty 1

## 2020-08-31 MED ORDER — HEPARIN SODIUM (PORCINE) 5000 UNIT/ML IJ SOLN: 5000.0000 [IU] | Freq: Three times a day (TID) | INTRAMUSCULAR | Status: DC

## 2020-08-31 MED ORDER — PROPOFOL 10 MG/ML IV BOLUS
INTRAVENOUS | Status: DC | PRN
  Administered 2020-08-31: 120 mg via INTRAVENOUS

## 2020-08-31 MED ORDER — LIDOCAINE 2% (20 MG/ML) 5 ML SYRINGE
INTRAMUSCULAR | Status: AC
  Filled 2020-08-31: qty 5

## 2020-08-31 MED ORDER — PROPOFOL 10 MG/ML IV BOLUS
INTRAVENOUS | Status: AC
  Filled 2020-08-31: qty 20

## 2020-08-31 MED ORDER — ROCURONIUM BROMIDE 10 MG/ML (PF) SYRINGE
PREFILLED_SYRINGE | INTRAVENOUS | Status: AC
  Filled 2020-08-31: qty 10

## 2020-08-31 MED ORDER — ORAL CARE MOUTH RINSE
15.0000 mL | Freq: Once | OROMUCOSAL | Status: AC
  Administered 2020-08-31: 15 mL via OROMUCOSAL

## 2020-08-31 MED ORDER — ONDANSETRON HCL 4 MG/2ML IJ SOLN
INTRAMUSCULAR | Status: AC
  Filled 2020-08-31: qty 2

## 2020-08-31 MED ORDER — BUPIVACAINE LIPOSOME 1.3 % IJ SUSP
INTRAMUSCULAR | Status: DC | PRN
  Administered 2020-08-31: 20 mL

## 2020-08-31 MED ORDER — 0.9 % SODIUM CHLORIDE (POUR BTL) OPTIME
TOPICAL | Status: DC | PRN
  Administered 2020-08-31: 1000 mL

## 2020-08-31 MED ORDER — KCL IN DEXTROSE-NACL 20-5-0.45 MEQ/L-%-% IV SOLN
INTRAVENOUS | Status: DC
  Filled 2020-08-31: qty 1000

## 2020-08-31 MED ORDER — FENTANYL CITRATE (PF) 100 MCG/2ML IJ SOLN
25.0000 ug | INTRAMUSCULAR | Status: DC | PRN
  Administered 2020-08-31: 50 ug via INTRAVENOUS

## 2020-08-31 MED ORDER — FENTANYL CITRATE (PF) 250 MCG/5ML IJ SOLN
INTRAMUSCULAR | Status: AC
  Filled 2020-08-31: qty 5

## 2020-08-31 MED ORDER — CHLORHEXIDINE GLUCONATE 0.12 % MT SOLN: 15.0000 mL | Freq: Once | OROMUCOSAL | Status: AC

## 2020-08-31 MED ORDER — PANTOPRAZOLE SODIUM 40 MG PO TBEC: 40.0000 mg | DELAYED_RELEASE_TABLET | Freq: Every day | ORAL | Status: DC

## 2020-08-31 MED ORDER — NITROGLYCERIN 0.4 MG SL SUBL: 0.4000 mg | SUBLINGUAL_TABLET | SUBLINGUAL | Status: DC | PRN

## 2020-08-31 MED ORDER — CEFAZOLIN SODIUM-DEXTROSE 2-4 GM/100ML-% IV SOLN
2.0000 g | INTRAVENOUS | Status: AC
  Administered 2020-08-31: 2 g via INTRAVENOUS
  Filled 2020-08-31: qty 100

## 2020-08-31 MED ORDER — METOPROLOL TARTRATE 5 MG/5ML IV SOLN: 5.0000 mg | Freq: Four times a day (QID) | INTRAVENOUS | Status: DC | PRN

## 2020-08-31 MED ORDER — SODIUM CHLORIDE (PF) 0.9 % IJ SOLN
INTRAMUSCULAR | Status: AC
  Filled 2020-08-31: qty 10

## 2020-08-31 MED ORDER — OXYCODONE HCL 5 MG/5ML PO SOLN
5.0000 mg | Freq: Once | ORAL | Status: DC | PRN
Start: 2020-08-31 — End: 2020-08-31

## 2020-08-31 MED ORDER — ONDANSETRON 4 MG PO TBDP: 4.0000 mg | ORAL_TABLET | Freq: Four times a day (QID) | ORAL | Status: DC | PRN

## 2020-08-31 MED ORDER — ASPIRIN EC 81 MG PO TBEC: 81.0000 mg | DELAYED_RELEASE_TABLET | Freq: Every day | ORAL | Status: DC

## 2020-08-31 MED ORDER — ALBUMIN HUMAN 5 % IV SOLN: INTRAVENOUS | Status: DC | PRN

## 2020-08-31 MED ORDER — ROCURONIUM BROMIDE 10 MG/ML (PF) SYRINGE
PREFILLED_SYRINGE | INTRAVENOUS | Status: DC | PRN
  Administered 2020-08-31: 20 mg via INTRAVENOUS
  Administered 2020-08-31: 10 mg via INTRAVENOUS
  Administered 2020-08-31: 80 mg via INTRAVENOUS
  Administered 2020-08-31: 10 mg via INTRAVENOUS

## 2020-08-31 MED ORDER — ONDANSETRON HCL 4 MG/2ML IJ SOLN: 4.0000 mg | Freq: Four times a day (QID) | INTRAMUSCULAR | Status: DC | PRN

## 2020-08-31 MED ORDER — OXYCODONE HCL 5 MG PO TABS
5.0000 mg | ORAL_TABLET | ORAL | Status: DC | PRN
  Administered 2020-08-31 – 2020-09-01 (×2): 5 mg via ORAL
  Filled 2020-08-31 (×2): qty 1

## 2020-08-31 MED ORDER — HYDROCODONE-ACETAMINOPHEN 5-325 MG PO TABS: 1.0000 | ORAL_TABLET | Freq: Four times a day (QID) | ORAL | 0 refills | Status: DC | PRN

## 2020-08-31 MED ORDER — FENTANYL CITRATE (PF) 100 MCG/2ML IJ SOLN
INTRAMUSCULAR | Status: AC
  Administered 2020-08-31: 50 ug via INTRAVENOUS
  Filled 2020-08-31: qty 2

## 2020-08-31 MED ORDER — ROSUVASTATIN CALCIUM 20 MG PO TABS: 40.0000 mg | ORAL_TABLET | Freq: Every day | ORAL | Status: DC

## 2020-08-31 MED ORDER — DEXAMETHASONE SODIUM PHOSPHATE 10 MG/ML IJ SOLN
INTRAMUSCULAR | Status: AC
  Filled 2020-08-31: qty 1

## 2020-08-31 MED ORDER — ACETAMINOPHEN 500 MG PO TABS
1000.0000 mg | ORAL_TABLET | ORAL | Status: AC
  Administered 2020-08-31: 1000 mg via ORAL
  Filled 2020-08-31: qty 2

## 2020-08-31 MED ORDER — SUGAMMADEX SODIUM 500 MG/5ML IV SOLN
INTRAVENOUS | Status: AC
  Filled 2020-08-31: qty 5

## 2020-08-31 MED ORDER — PANTOPRAZOLE SODIUM 40 MG IV SOLR
40.0000 mg | Freq: Every day | INTRAVENOUS | Status: DC
  Administered 2020-08-31: 40 mg via INTRAVENOUS
  Filled 2020-08-31: qty 40

## 2020-08-31 MED ORDER — OXYCODONE HCL 5 MG PO TABS: 5.0000 mg | ORAL_TABLET | Freq: Once | ORAL | Status: DC | PRN

## 2020-08-31 MED ORDER — SODIUM CHLORIDE 0.9 % IV SOLN
2.0000 g | Freq: Two times a day (BID) | INTRAVENOUS | Status: AC
  Administered 2020-08-31: 2 g via INTRAVENOUS
  Filled 2020-08-31: qty 2

## 2020-08-31 MED ORDER — LATANOPROST 0.005 % OP SOLN
1.0000 [drp] | Freq: Every day | OPHTHALMIC | Status: DC
  Administered 2020-08-31: 1 [drp] via OPHTHALMIC
  Filled 2020-08-31: qty 2.5

## 2020-08-31 MED ORDER — SODIUM CHLORIDE (PF) 0.9 % IJ SOLN
INTRAMUSCULAR | Status: DC | PRN
  Administered 2020-08-31: 10 mL

## 2020-08-31 MED ORDER — SCOPOLAMINE 1 MG/3DAYS TD PT72
1.0000 | MEDICATED_PATCH | TRANSDERMAL | Status: DC
  Filled 2020-08-31: qty 1

## 2020-08-31 MED ORDER — FENTANYL CITRATE (PF) 100 MCG/2ML IJ SOLN
12.5000 ug | INTRAMUSCULAR | Status: DC | PRN
Start: 2020-08-31 — End: 2020-09-01
  Administered 2020-08-31: 12.5 ug via INTRAVENOUS
  Filled 2020-08-31: qty 2

## 2020-08-31 MED ORDER — DEXAMETHASONE SODIUM PHOSPHATE 10 MG/ML IJ SOLN
INTRAMUSCULAR | Status: DC | PRN
  Administered 2020-08-31: 10 mg via INTRAVENOUS

## 2020-08-31 MED ORDER — SUGAMMADEX SODIUM 200 MG/2ML IV SOLN
INTRAVENOUS | Status: DC | PRN
  Administered 2020-08-31: 321.2 mg via INTRAVENOUS

## 2020-08-31 MED ORDER — ALBUMIN HUMAN 5 % IV SOLN
INTRAVENOUS | Status: AC
  Filled 2020-08-31: qty 500

## 2020-08-31 SURGICAL SUPPLY — 57 items
APL PRP STRL LF DISP 70% ISPRP (MISCELLANEOUS) ×1
APL SWBSTK 6 STRL LF DISP (MISCELLANEOUS)
APPLICATOR COTTON TIP 6 STRL (MISCELLANEOUS) ×2 IMPLANT
APPLICATOR COTTON TIP 6IN STRL (MISCELLANEOUS)
BLADE SURG 15 STRL LF DISP TIS (BLADE) ×1 IMPLANT
BLADE SURG 15 STRL SS (BLADE) ×2
CHLORAPREP W/TINT 26 (MISCELLANEOUS) ×2 IMPLANT
COVER MAYO STAND STRL (DRAPES) ×1 IMPLANT
COVER SURGICAL LIGHT HANDLE (MISCELLANEOUS) ×2 IMPLANT
COVER TIP SHEARS 8 DVNC (MISCELLANEOUS) ×1 IMPLANT
COVER TIP SHEARS 8MM DA VINCI (MISCELLANEOUS) ×2
COVER WAND RF STERILE (DRAPES) IMPLANT
DECANTER SPIKE VIAL GLASS SM (MISCELLANEOUS) ×2 IMPLANT
DRAPE ARM DVNC X/XI (DISPOSABLE) ×4 IMPLANT
DRAPE COLUMN DVNC XI (DISPOSABLE) ×1 IMPLANT
DRAPE DA VINCI XI ARM (DISPOSABLE) ×6
DRAPE DA VINCI XI COLUMN (DISPOSABLE) ×2
ELECT REM PT RETURN 15FT ADLT (MISCELLANEOUS) ×2 IMPLANT
GLOVE BIOGEL M 8.0 STRL (GLOVE) ×4 IMPLANT
GOWN STRL REUS W/TWL XL LVL3 (GOWN DISPOSABLE) ×7 IMPLANT
IRRIG SUCT STRYKERFLOW 2 WTIP (MISCELLANEOUS)
IRRIGATION SUCT STRKRFLW 2 WTP (MISCELLANEOUS) ×1 IMPLANT
KIT BASIN OR (CUSTOM PROCEDURE TRAY) ×2 IMPLANT
KIT TURNOVER KIT A (KITS) ×2 IMPLANT
MESH 3DMAX 4X6 LT LRG (Mesh General) ×1 IMPLANT
NEEDLE HYPO 22GX1.5 SAFETY (NEEDLE) ×2 IMPLANT
OBTURATOR OPTICAL STANDARD 8MM (TROCAR) ×2
OBTURATOR OPTICAL STND 8 DVNC (TROCAR) ×1
OBTURATOR OPTICALSTD 8 DVNC (TROCAR) IMPLANT
PACK CARDIOVASCULAR III (CUSTOM PROCEDURE TRAY) ×2 IMPLANT
PAD POSITIONING PINK XL (MISCELLANEOUS) ×2 IMPLANT
PENCIL SMOKE EVACUATOR (MISCELLANEOUS) IMPLANT
SCISSORS LAP 5X35 DISP (ENDOMECHANICALS) ×2 IMPLANT
SEAL CANN UNIV 5-8 DVNC XI (MISCELLANEOUS) ×3 IMPLANT
SEAL XI 5MM-8MM UNIVERSAL (MISCELLANEOUS) ×6
SET BI-LUMEN FLTR TB AIRSEAL (TUBING) ×1 IMPLANT
SOL ANTI FOG 6CC (MISCELLANEOUS) ×1 IMPLANT
SOLUTION ANTI FOG 6CC (MISCELLANEOUS) ×1
SOLUTION ELECTROLUBE (MISCELLANEOUS) ×2 IMPLANT
SPONGE LAP 18X18 RF (DISPOSABLE) ×2 IMPLANT
STAPLER VISISTAT 35W (STAPLE) IMPLANT
SUT ETHIBOND 2 0 SH (SUTURE) ×2
SUT ETHIBOND 2 0 SH 36X2 (SUTURE) IMPLANT
SUT VIC AB 3-0 SH 27 (SUTURE) ×2
SUT VIC AB 3-0 SH 27XBRD (SUTURE) IMPLANT
SUT VIC AB 4-0 SH 18 (SUTURE) ×2 IMPLANT
SUT VLOC 180 2-0 9IN GS21 (SUTURE) IMPLANT
SUT VLOC BARB 180 ABS3/0GR12 (SUTURE) ×2
SUTURE VLOC BRB 180 ABS3/0GR12 (SUTURE) IMPLANT
SYR 10ML ECCENTRIC (SYRINGE) ×1 IMPLANT
SYR 20ML LL LF (SYRINGE) ×2 IMPLANT
TAPE STRIPS DRAPE STRL (GAUZE/BANDAGES/DRESSINGS) ×1 IMPLANT
TOWEL OR 17X26 10 PK STRL BLUE (TOWEL DISPOSABLE) ×2 IMPLANT
TOWEL OR NON WOVEN STRL DISP B (DISPOSABLE) ×2 IMPLANT
TRAY FOLEY MTR SLVR 16FR STAT (SET/KITS/TRAYS/PACK) ×1 IMPLANT
TROCAR ADV FIXATION 12X100MM (TROCAR) IMPLANT
TROCAR BLADELESS OPT 5 100 (ENDOMECHANICALS) ×1 IMPLANT

## 2020-08-31 NOTE — Anesthesia Postprocedure Evaluation (Signed)
Anesthesia Post Note  Patient: [name] [name]  Procedure(s) Performed: XI ROBOTIC ASSISTED LEFT INGUINAL HERNIA REPAIR TAPP (Left Abdomen)     Patient location during evaluation: PACU Anesthesia Type: General Level of consciousness: awake and alert Pain management: pain level controlled Vital Signs Assessment: post-procedure vital signs reviewed and stable Respiratory status: spontaneous breathing, nonlabored ventilation, respiratory function stable and patient connected to nasal cannula oxygen Cardiovascular status: blood pressure returned to baseline and stable Postop Assessment: no apparent nausea or vomiting Anesthetic complications: no   No complications documented.  Last Vitals:  Vitals:   08/31/20 2058 08/31/20 2147  BP: (!) 157/80 138/75  Pulse: 92 93  Resp: 18 18  Temp: (!) 36.4 C   SpO2: 98% 95%    Last Pain:  Vitals:   08/31/20 2058  TempSrc: Oral  PainSc:                  [name]

## 2020-08-31 NOTE — Anesthesia Preprocedure Evaluation (Addendum)
Anesthesia Evaluation  Patient identified by MRN, date of birth, ID band Patient awake    Reviewed: Allergy & Precautions, NPO status , Patient's Chart, lab work & pertinent test results  Airway Mallampati: II  TM Distance: >3 FB Neck ROM: Full    Dental  (+) Poor Dentition   Pulmonary COPD, Current Smoker,    Pulmonary exam normal breath sounds clear to auscultation       Cardiovascular hypertension, + CAD, + Past MI (NSTEMI) and + Peripheral Vascular Disease  Normal cardiovascular exam Rhythm:Regular Rate:Normal     Neuro/Psych negative neurological ROS  negative psych ROS   GI/Hepatic Neg liver ROS, GERD  ,  Endo/Other  hyperlipidemia  Renal/GU   negative genitourinary   Musculoskeletal negative musculoskeletal ROS (+)   Abdominal   Peds  Hematology negative hematology ROS (+)   Anesthesia Other Findings   Reproductive/Obstetrics negative OB ROS                            Anesthesia Physical Anesthesia Plan  ASA: III  Anesthesia Plan: General   Post-op Pain Management:    Induction: Intravenous  PONV Risk Score and Plan: 1 and Scopolamine patch - Pre-op, Treatment may vary due to age or medical condition, Midazolam and Ondansetron  Airway Management Planned: Oral ETT  Additional Equipment: None  Intra-op Plan:   Post-operative Plan: Extubation in OR  Informed Consent: I have reviewed the patients History and Physical, chart, labs and discussed the procedure including the risks, benefits and alternatives for the proposed anesthesia with the patient or authorized representative who has indicated his/her understanding and acceptance.       Plan Discussed with: CRNA and Anesthesiologist  Anesthesia Plan Comments:         Anesthesia Quick Evaluation

## 2020-08-31 NOTE — Progress Notes (Signed)
Pt updated about delay. Pt expressed frustration but does note understanding.

## 2020-08-31 NOTE — Transfer of Care (Signed)
Immediate Anesthesia Transfer of Care Note  Patient: [name] [name]  Procedure(s) Performed: XI ROBOTIC ASSISTED LEFT INGUINAL HERNIA REPAIR TAPP (Left Abdomen)  Patient Location: PACU  Anesthesia Type:General  Level of Consciousness: drowsy and patient cooperative  Airway & Oxygen Therapy: Patient Spontanous Breathing and Patient connected to face mask oxygen  Post-op Assessment: Report given to RN and Post -op Vital signs reviewed and stable  Post vital signs: Reviewed and stable  Last Vitals:  Vitals Value Taken Time  BP    Temp    Pulse 96 08/31/20 1803  Resp 14 08/31/20 1803  SpO2 100 % 08/31/20 1803  Vitals shown include unvalidated device data.  Last Pain:  Vitals:   08/31/20 0750  TempSrc: Oral         Complications: No complications documented.

## 2020-08-31 NOTE — Anesthesia Procedure Notes (Signed)
Procedure Name: Intubation Date/Time: 08/31/2020 1:29 PM Performed by: Myna Bright, CRNA Pre-anesthesia Checklist: Patient identified, Emergency Drugs available, Suction available and Patient being monitored Patient Re-evaluated:Patient Re-evaluated prior to induction Oxygen Delivery Method: Circle system utilized Preoxygenation: Pre-oxygenation with 100% oxygen Induction Type: IV induction Ventilation: Mask ventilation without difficulty and Oral airway inserted - appropriate to patient size Laryngoscope Size: Mac and 4 Grade View: Grade I Tube type: Oral Tube size: 7.5 mm Number of attempts: 1 Airway Equipment and Method: Stylet Placement Confirmation: ETT inserted through vocal cords under direct vision,  positive ETCO2 and breath sounds checked- equal and bilateral Secured at: 22 cm Tube secured with: Tape Dental Injury: Teeth and Oropharynx as per pre-operative assessment

## 2020-08-31 NOTE — H&P (Signed)
Chief Complaint:  Left inguinal hernia  History of Present Illness:  [name] [name] is an 70 y.o. male who presents with a new left inguinal hernia  Past Medical History:  Diagnosis Date  . BARRETTS ESOPHAGUS   . Carotid artery occlusion   . COLONIC POLYPS, HX OF   . COPD    pt denies   . CORONARY ARTERY DISEASE    a. 2007: cath showing 95% stenosis proximal LAD (BMS placed), 40% D1, and 75% RCA stenosis. b. NST 08/2008: no evidence of ischemia c. NST 04/2012: no evidence of ischemia.  Marland Kitchen GERD   . GLAUCOMA   . HYPERCHOLESTEROLEMIA   . HYPERLIPIDEMIA   . NSTEMI (non-ST elevated myocardial infarction) (HCC) 02/25/2016  . Other constipation   . OTITIS EXTERNA   . Peripheral vascular disease (HCC)    pt denies   . Thoracic aorta atherosclerosis [facility])     Past Surgical History:  Procedure Laterality Date  . CARDIAC CATHETERIZATION N/A 02/26/2016   Procedure: Left Heart Cath and Coronary Angiography;  Surgeon: Iran Ouch, MD;  Location: [facility] INVASIVE CV LAB;  Service: Cardiovascular;  Laterality: N/A;  . CARDIAC CATHETERIZATION N/A 02/26/2016   Procedure: Coronary Stent Intervention;  Surgeon: Iran Ouch, MD;  Location: [facility] INVASIVE CV LAB;  Service: Cardiovascular;  Laterality: N/A;  . CORONARY STENT PLACEMENT    . ESOPHAGOGASTRODUODENOSCOPY    . UMBILICAL HERNIA REPAIR      Current Facility-Administered Medications  Medication Dose Route Frequency Provider Last Rate Last Admin  . bupivacaine liposome (EXPAREL) 1.3 % injection 266 mg  20 mL Infiltration On Call to OR Luretha Murphy, MD      . ceFAZolin (ANCEF) IVPB 2g/100 mL premix  2 g Intravenous On Call to OR Luretha Murphy, MD      . Chlorhexidine Gluconate Cloth 2 % PADS 6 each  6 each Topical Once Luretha Murphy, MD      . lactated ringers infusion   Intravenous Continuous Mellody Dance, MD 10 mL/hr at 08/31/20 0758 New Bag at 08/31/20 0758   Patient has no known allergies. Family History  Problem Relation  Age of Onset  . Heart disease Father 48       Before age 37- Open Heart surgery  . Hyperlipidemia Mother    Social History:   reports that he has been smoking cigarettes. He has a 11.75 pack-year smoking history. His smokeless tobacco use includes chew. He reports current alcohol use of about 2.0 standard drinks of alcohol per week. He reports that he does not use drugs.   REVIEW OF SYSTEMS : Negative except for infant surgery for large abdominal hernia  Physical Exam:   Blood pressure (!) 141/76, pulse 73, temperature 97.6 F (36.4 C), temperature source Oral, resp. rate 16, SpO2 100 %. There is no height or weight on file to calculate BMI.  Gen:  WDWN WM NAD  Neurological: Alert and oriented to person, place, and time. Motor and sensory function is grossly intact  Head: Normocephalic and atraumatic.  Eyes: Conjunctivae are normal. Pupils are equal, round, and reactive to light. No scleral icterus.  Neck: Normal range of motion. Neck supple. No tracheal deviation or thyromegaly present.  Cardiovascular:  SR without murmurs or gallops.  No carotid bruits Breast:  Not examined Respiratory: Effort normal.  No respiratory distress. No chest wall tenderness. Breath sounds normal.  No wheezes, rales or rhonchi.  Abdomen:  Left inguinal hernia that came out in December;  Midabdominal  scars suggest prior gastroschisis ("I've never had a problem with it") GU:  Left inguinal hernia Musculoskeletal: Normal range of motion. Extremities are nontender. No cyanosis, edema or clubbing noted Lymphadenopathy: No cervical, preauricular, postauricular or axillary adenopathy is present Skin: Skin is warm and dry. No rash noted. No diaphoresis. No erythema. No pallor. Pscyh: Normal mood and affect. Behavior is normal. Judgment and thought content normal.   LABORATORY RESULTS: No results found for this or any previous visit (from the past 48 hour(s)).   RADIOLOGY RESULTS: No results found.  Problem  List: Patient Active Problem List   Diagnosis Date Noted  . Left inguinal hernia 06/01/2020  . Lumbar disc herniation 12/18/2019  . Low back pain 12/04/2019  . Left arm pain 09/02/2019  . Orthostatic dizziness 03/19/2019  . Testicle pain 10/30/2017  . Thoracic aorta atherosclerosis (HCC)   . Preventative health care 10/17/2016  . Hemoptysis 10/17/2016  . Carotid artery disease (HCC) 03/14/2012  . Cerebrovascular disease 01/27/2011  . Hypertension 01/27/2011  . CAROTID BRUIT 01/26/2010  . TOBACCO ABUSE 08/27/2008  . Hyperlipidemia LDL goal <70 04/15/2007  . HYPERCHOLESTEROLEMIA 04/11/2007  . GLAUCOMA 04/11/2007  . CAD S/P percutaneous coronary angioplasty 04/11/2007  . COPD (chronic obstructive pulmonary disease) (HCC) 04/11/2007  . GERD 04/11/2007  . BARRETTS ESOPHAGUS 04/11/2007  . COLONIC POLYPS, HX OF 04/11/2007    Assessment & Plan: Left inguinal hernia;  Plan robotic unless the adhesions from infantile surgery precludes and open repair is needed.      Matt B. Daphine Deutscher, MD, [facility] [phone] beeper [phone]  08/31/2020 12:41 PM

## 2020-08-31 NOTE — Interval H&P Note (Signed)
History and Physical Interval Note:  08/31/2020 1:01 PM  [name] [name]  has presented today for surgery, with the diagnosis of LEFT INGUINAL HERNIA.  The various methods of treatment have been discussed with the patient and family. After consideration of risks, benefits and other options for treatment, the patient has consented to  Procedure(s): XI ROBOTIC ASSISTED LEFT INGUINAL HERNIA REPAIR TAPP (Left) as a surgical intervention.  The patient's history has been reviewed, patient examined, no change in status, stable for surgery.  I have reviewed the patient's chart and labs.  Questions were answered to the patient's satisfaction.     Valarie Merino

## 2020-08-31 NOTE — Op Note (Signed)
MORRISON MCBRYAR  [birth year]   08/31/2020    PCP:  Karie Schwalbe, MD   Surgeon: Wenda Low, MD, FACS  Asst:  Gaynelle Adu, MD, FACS  Anes:  general  Preop Dx: Left inguinal hernia Postop Dx: Left indirect scrotal hernia  Procedure: Xi robotic inguinal hernia with large left 3 D max mesh Location Surgery: [facility] 2 Complications: None noted  EBL:   minimal cc  Drains: none  Description of Procedure:  The patient was taken to OR 2 .  After anesthesia was administered and the patient was prepped  with chloroprep  and a timeout was performed.  Access to the abdomen was attempted first to the left upper quadrant.  The patient had had a previous infantile abdominal operation either for gastroschisis or something about his umbilicus.  We worked around that.  Placing the the [employer] through the left upper quadrant was obscured and I was concerned that there may be adhesions.  I then placed a 5 mm through the right upper quadrant and then insufflated.  The trocar was the left upper quadrant was an thumb fat.  We inspected this and did not see any bleeding or evidence of a bowel injury.  The three 8 mm trochars were placed in the upper abdomen having exchanged out the 5 mm to a an 8 mm.  The robot was docked.  Inspection revealed what appeared to be an indirect inguinal hernia in the left inguinal region.  About 8 cm above the inguinal ring we incised peritoneum and then created flap down to the pubis medially and laterally and then around the cord structures.  The sac was then dissected from the cord structures and it turned out to be more like a scrotal hernia.  There was one little hole made in it which was subsequently closed at the end of the case.  After freeing the the hernia sac and skeletonizing the cord structures and identifying the vas deferens and laterally the gonadal vessels I placed a piece of 3D max mesh large.  It was tacked medially and superiorly and laterally with  interrupted 2 oh Surgidek's.  The mesh was lying nicely and I was then able to close the flaps with a running V-Loc from medial to lateral and there was a little area laterally that I reinforced with a 4-0 Vicryl figure-of-eight.  The hernia sac I found the hole there and closed that with a figure-of-eight 4-0 Vicryl.  Then went back and reinspected the area around the primary trocar site entrance.  No injury was noted.  A 12 mm that we had to expand to on the far right side the patient was closed with a single 0 Vicryl with a pmi. Device.  The skin was closed with 4-0 Dermabond.   The patient tolerated the procedure well and was taken to the PACU in stable condition.     Matt B. Daphine Deutscher, MD, [facility], [place] [phone]

## 2020-08-31 NOTE — Discharge Instructions (Signed)
Laparoscopic Inguinal Hernia Repair, Adult, Care After The following information offers guidance on how to care for yourself after your procedure. Your health care provider may also give you more specific instructions. If you have problems or questions, contact your health care provider. What can I expect after the procedure? After the procedure, it is common to have:  Pain.  Swelling and bruising around the incision area.  Scrotal swelling, in males.  Some fluid or blood draining from your incisions. Follow these instructions at home: Medicines  Take over-the-counter and prescription medicines only as told by your health care provider.  Ask your health care provider if the medicine prescribed to you: ? Requires you to avoid driving or using machinery. ? Can cause constipation. You may need to take these actions to prevent or treat constipation:  Drink enough fluid to keep your urine pale yellow.  Take over-the-counter or prescription medicines.  Eat foods that are high in fiber, such as beans, whole grains, and fresh fruits and vegetables.  Limit foods that are high in fat and processed sugars, such as fried or sweet foods. Incision care  Follow instructions from your health care provider about how to take care of your incisions. Make sure you: ? Wash your hands with soap and water for at least 20 seconds before and after you change your bandage (dressing). If soap and water are not available, use hand sanitizer. ? Change your dressing as told by your health care provider. ? Leave stitches (sutures), skin glue, or adhesive strips in place. These skin closures may need to stay in place for 2 weeks or longer. If adhesive strip edges start to loosen and curl up, you may trim the loose edges. Do not remove adhesive strips completely unless your health care provider tells you to do that.  Check your incision area every day for signs of infection. Check for: ? More redness, swelling,  or pain. ? More fluid or blood. ? Warmth. ? Pus or a bad smell.  Wear loose, soft clothing while your incisions heal.   Managing pain and swelling If directed, put ice on the painful or swollen areas. To do this:  Put ice in a plastic bag.  Place a towel between your skin and the bag.  Leave the ice on for 20 minutes, 2-3 times a day.  Remove the ice if your skin turns bright red. This is very important. If you cannot feel pain, heat, or cold, you have a greater risk of damage to the area.   Activity  Do not lift anything that is heavier than 10 lb (4.5 kg), or the limit that you are told, until your health care provider says that it is safe.  Ask your health care provider what activities are safe for you. A lot of activity during the first week after surgery can increase pain and swelling. For 1 week after your procedure: ? Avoid activities that take a lot of effort, such as exercise or sports. ? You may walk and climb stairs as needed for daily activity, but avoid long walks or climbing stairs for exercise. General instructions  If you were given a sedative during the procedure, it can affect you for several hours. Do not drive or operate machinery until your health care provider says that it is safe.  Do not take baths, swim, or use a hot tub until your health care provider approves. Ask your health care provider if you may take showers. You may only be allowed   to take sponge baths.  Do not use any products that contain nicotine or tobacco. These products include cigarettes, chewing tobacco, and vaping devices, such as e-cigarettes. If you need help quitting, ask your health care provider.  Keep all follow-up visits. This is important. Contact a health care provider if:  You have any of these signs of infection: ? More redness, swelling, or pain around your incisions or your groin area. ? More fluid or blood coming from an incision. ? Warmth coming from an incision. ? Pus or  a bad smell coming from an incision. ? A fever or chills.  You have more swelling in your scrotum, if you are male.  You have severe pain and medicines do not help.  You have abdominal pain or swelling.  You cannot urinate or have a bowel movement.  You faint or feel dizzy.  You have nausea and vomiting. Get help right away if:  You have redness, warmth, or pain in your leg.  You have chest pain.  You have problems breathing. These symptoms may represent a serious problem that is an emergency. Do not wait to see if the symptoms will go away. Get medical help right away. Call your local emergency services (911 in the [place] Do not drive yourself to the hospital. Summary  Pain, swelling, and bruising are common after the procedure.  Check your incision area every day for signs of infection, such as more redness, swelling, or pain.  Put ice on painful or swollen areas for 20 minutes, 2-3 times a day. This information is not intended to replace advice given to you by your health care provider. Make sure you discuss any questions you have with your health care provider. Document Revised: 12/10/2019 Document Reviewed: 12/10/2019 Elsevier Patient Education  2021 Elsevier Inc.  

## 2020-09-01 ENCOUNTER — Encounter (HOSPITAL_COMMUNITY): Payer: Self-pay | Admitting: Surgery

## 2020-09-01 DIAGNOSIS — K4091 Unilateral inguinal hernia, without obstruction or gangrene, recurrent: Secondary | ICD-10-CM | POA: Diagnosis not present

## 2020-09-01 LAB — BASIC METABOLIC PANEL
Anion gap: 4 — ABNORMAL LOW (ref 5–15)
BUN: 17 mg/dL (ref 8–23)
CO2: 30 mmol/L (ref 22–32)
Calcium: 9.3 mg/dL (ref 8.9–10.3)
Chloride: 107 mmol/L (ref 98–111)
Creatinine, Ser: 1.21 mg/dL (ref 0.61–1.24)
GFR, Estimated: >60 mL/min (ref >60)
Glucose, Bld: 140 mg/dL — ABNORMAL HIGH (ref 70–99)
Potassium: 4.7 mmol/L (ref 3.5–5.1)
Sodium: 141 mmol/L (ref 135–145)

## 2020-09-01 LAB — CBC
HCT: 40.5 % (ref 39.0–52.0)
Hemoglobin: 13.6 g/dL (ref 13.0–17.0)
MCH: 31.4 pg (ref 26.0–34.0)
MCHC: 33.6 g/dL (ref 30.0–36.0)
MCV: 93.5 fL (ref 80.0–100.0)
Platelets: 228 10*3/uL (ref 150–400)
RBC: 4.33 MIL/uL (ref 4.22–5.81)
RDW: 12.3 % (ref 11.5–15.5)
WBC: 13.8 10*3/uL — ABNORMAL HIGH (ref 4.0–10.5)
nRBC: 0 % (ref 0.0–0.2)

## 2020-09-01 NOTE — Progress Notes (Signed)
Pt was discharged home today. Instructions were reviewed with patient, and questions were answered. Pt was taken to main entrance via wheelchair by NT.  

## 2020-09-01 NOTE — Discharge Summary (Signed)
Physician Discharge Summary  Patient ID: [name] [name] MRN: [id number] DOB/AGE: 10-13-1950 70 y.o.  PCP: Karie Schwalbe, MD  Admit date: 08/31/2020 Discharge date: 09/01/2020  Admission Diagnoses:  Left inguinal hernia  Discharge Diagnoses:  Left scrotal indirect inguinal hernia  Active Problems:   History of inguinal hernia repair   Surgery:  Robotic Xi repair of left inguinal hernia  Discharged Condition: improved; hungry and with minimal discomfort  Hospital Course:   Patient admitted after late evening repair of a large scrotal hernia with the robot.  Kept overnight and ready for discharge in the morning  Consults: none  Significant Diagnostic Studies: none    Discharge Exam: Blood pressure 112/62, pulse 74, temperature 97.9 F (36.6 C), temperature source Oral, resp. rate 18, SpO2 94 %. Incisions are OK.  Minimal pain.    Disposition: Discharge disposition: 01-Home or Self Care       Discharge Instructions     Call MD for:  persistant nausea and vomiting   Complete by: As directed    Call MD for:  redness, tenderness, or signs of infection (pain, swelling, redness, odor or green/yellow discharge around incision site)   Complete by: As directed    Call MD for:  redness, tenderness, or signs of infection (pain, swelling, redness, odor or green/yellow discharge around incision site)   Complete by: As directed    Call MD for:  severe uncontrolled pain   Complete by: As directed    Call MD for:  temperature >100.4   Complete by: As directed    Diet - low sodium heart healthy   Complete by: As directed    Diet - low sodium heart healthy   Complete by: As directed    Discharge instructions   Complete by: As directed    Ice packs to groin   Discharge instructions   Complete by: As directed    Advance diet as tolerated You may shower today RX for pain should be at pharmacy in [place]   Discharge wound care:   Complete by: As directed    May shower in  the morning.   Discharge wound care:   Complete by: As directed    Shower today as needed   Increase activity slowly   Complete by: As directed    Increase activity slowly   Complete by: As directed       Allergies as of 09/01/2020   No Known Allergies      Medication List     TAKE these medications    aspirin EC 81 MG tablet Take 1 tablet (81 mg total) by mouth daily.   HYDROcodone-acetaminophen 5-325 MG tablet Commonly known as: NORCO/VICODIN Take 1 tablet by mouth every 6 (six) hours as needed for moderate pain.   latanoprost 0.005 % ophthalmic solution Commonly known as: XALATAN Place 1 drop into the right eye at bedtime.   metoprolol tartrate 25 MG tablet Commonly known as: LOPRESSOR Take 0.5 tablets (12.5 mg total) by mouth 2 (two) times daily.   nitroGLYCERIN 0.4 MG SL tablet Commonly known as: NITROSTAT Place 1 tablet (0.4 mg total) under the tongue every 5 (five) minutes as needed for chest pain.   pantoprazole 40 MG tablet Commonly known as: PROTONIX TAKE 1 TABLET(40 MG) BY MOUTH DAILY What changed:  how much to take how to take this when to take this additional instructions   rosuvastatin 40 MG tablet Commonly known as: CRESTOR TAKE 1 TABLET(40 MG) BY MOUTH DAILY What changed:  See the new instructions.               Discharge Care Instructions  (From admission, onward)           Start     Ordered   09/01/20 0000  Discharge wound care:       Comments: Shower today as needed   09/01/20 0755   08/31/20 0000  Discharge wound care:       Comments: May shower in the morning.   08/31/20 1819            Follow-up Information     Luretha Murphy, MD In 3 weeks.   Specialty: General Surgery Why: For routine follow up with Dr. Bennett Scrape information: [address] [place] [address] [phone]                 Signed: Valarie Merino 09/01/2020, 7:55 AM

## 2020-09-01 NOTE — Plan of Care (Signed)

## 2020-11-13 ENCOUNTER — Encounter: Payer: PPO | Admitting: Internal Medicine

## 2020-11-18 ENCOUNTER — Other Ambulatory Visit: Payer: Self-pay | Admitting: Cardiology

## 2020-12-10 DIAGNOSIS — S52531A Colles' fracture of right radius, initial encounter for closed fracture: Secondary | ICD-10-CM | POA: Diagnosis not present

## 2020-12-15 ENCOUNTER — Ambulatory Visit
Admission: RE | Admit: 2020-12-15 | Discharge: 2020-12-15 | Disposition: A | Discharge status: Home / Self Care | Payer: PPO | Source: Ambulatory Visit | Attending: Orthopaedic Surgery | Admitting: Orthopaedic Surgery

## 2020-12-15 ENCOUNTER — Other Ambulatory Visit: Payer: Self-pay | Admitting: Orthopaedic Surgery

## 2020-12-15 ENCOUNTER — Other Ambulatory Visit: Payer: Self-pay

## 2020-12-15 DIAGNOSIS — M79605 Pain in left leg: Secondary | ICD-10-CM | POA: Diagnosis not present

## 2020-12-15 DIAGNOSIS — S52531A Colles' fracture of right radius, initial encounter for closed fracture: Secondary | ICD-10-CM | POA: Diagnosis not present

## 2020-12-15 DIAGNOSIS — R2242 Localized swelling, mass and lump, left lower limb: Secondary | ICD-10-CM | POA: Insufficient documentation

## 2020-12-15 DIAGNOSIS — R600 Localized edema: Secondary | ICD-10-CM | POA: Diagnosis not present

## 2020-12-17 ENCOUNTER — Ambulatory Visit: Payer: PPO | Admitting: Internal Medicine

## 2020-12-18 DIAGNOSIS — Z7982 Long term (current) use of aspirin: Secondary | ICD-10-CM | POA: Diagnosis not present

## 2020-12-18 DIAGNOSIS — S52531A Colles' fracture of right radius, initial encounter for closed fracture: Secondary | ICD-10-CM | POA: Diagnosis not present

## 2020-12-18 DIAGNOSIS — M79631 Pain in right forearm: Secondary | ICD-10-CM | POA: Diagnosis not present

## 2020-12-18 DIAGNOSIS — S52571A Other intraarticular fracture of lower end of right radius, initial encounter for closed fracture: Secondary | ICD-10-CM | POA: Diagnosis not present

## 2020-12-18 DIAGNOSIS — Z955 Presence of coronary angioplasty implant and graft: Secondary | ICD-10-CM | POA: Diagnosis not present

## 2020-12-18 DIAGNOSIS — G8918 Other acute postprocedural pain: Secondary | ICD-10-CM | POA: Diagnosis not present

## 2020-12-24 DIAGNOSIS — S52501D Unspecified fracture of the lower end of right radius, subsequent encounter for closed fracture with routine healing: Secondary | ICD-10-CM | POA: Diagnosis not present

## 2020-12-30 DIAGNOSIS — Z09 Encounter for follow-up examination after completed treatment for conditions other than malignant neoplasm: Secondary | ICD-10-CM | POA: Diagnosis not present

## 2021-01-05 DIAGNOSIS — S52501D Unspecified fracture of the lower end of right radius, subsequent encounter for closed fracture with routine healing: Secondary | ICD-10-CM | POA: Diagnosis not present

## 2021-01-26 DIAGNOSIS — S52501D Unspecified fracture of the lower end of right radius, subsequent encounter for closed fracture with routine healing: Secondary | ICD-10-CM | POA: Diagnosis not present

## 2021-05-04 ENCOUNTER — Ambulatory Visit (INDEPENDENT_AMBULATORY_CARE_PROVIDER_SITE_OTHER): Payer: PPO

## 2021-05-04 ENCOUNTER — Ambulatory Visit (INDEPENDENT_AMBULATORY_CARE_PROVIDER_SITE_OTHER): Payer: PPO | Admitting: Family

## 2021-05-04 ENCOUNTER — Other Ambulatory Visit: Payer: Self-pay

## 2021-05-04 ENCOUNTER — Encounter: Payer: Self-pay | Admitting: Family

## 2021-05-04 VITALS — BP 118/58 | HR 88 | Temp 98.4°F | Ht 71.0 in | Wt 162.8 lb

## 2021-05-04 DIAGNOSIS — F17200 Nicotine dependence, unspecified, uncomplicated: Secondary | ICD-10-CM

## 2021-05-04 DIAGNOSIS — R062 Wheezing: Secondary | ICD-10-CM | POA: Diagnosis not present

## 2021-05-04 DIAGNOSIS — R059 Cough, unspecified: Secondary | ICD-10-CM

## 2021-05-04 DIAGNOSIS — J219 Acute bronchiolitis, unspecified: Secondary | ICD-10-CM | POA: Diagnosis not present

## 2021-05-04 DIAGNOSIS — R918 Other nonspecific abnormal finding of lung field: Secondary | ICD-10-CM | POA: Diagnosis not present

## 2021-05-04 MED ORDER — PREDNISONE 10 MG (21) PO TBPK: ORAL_TABLET | ORAL | 0 refills | Status: DC

## 2021-05-04 MED ORDER — AMOXICILLIN-POT CLAVULANATE 875-125 MG PO TABS: 1.0000 | ORAL_TABLET | Freq: Two times a day (BID) | ORAL | 0 refills | Status: DC

## 2021-05-04 NOTE — Progress Notes (Signed)
Acute Office Visit  Subjective:    Patient ID: [name] [name], male    DOB: 1951/03/09, 71 y.o.   MRN: [id number]  Chief Complaint  Patient presents with   Cough    On going for two weeks with some feeling of SOB     HPI Patient is in today with c/o cough, congestion, x 2 weeks with green phlegm production x 2 weeks. Patient is a smoker of 1 ppd but recently has been smoking less. Has not had a recent chest xray.   Past Medical History:  Diagnosis Date   BARRETTS ESOPHAGUS    Carotid artery occlusion    COLONIC POLYPS, HX OF    COPD    pt denies    CORONARY ARTERY DISEASE    a. 2007: cath showing 95% stenosis proximal LAD (BMS placed), 40% D1, and 75% RCA stenosis. b. NST 08/2008: no evidence of ischemia c. NST 04/2012: no evidence of ischemia.   GERD    GLAUCOMA    HYPERCHOLESTEROLEMIA    HYPERLIPIDEMIA    NSTEMI (non-ST elevated myocardial infarction) (HCC) 02/25/2016   Other constipation    OTITIS EXTERNA    Peripheral vascular disease (HCC)    pt denies    Thoracic aorta atherosclerosis [facility])     Past Surgical History:  Procedure Laterality Date   CARDIAC CATHETERIZATION N/A 02/26/2016   Procedure: Left Heart Cath and Coronary Angiography;  Surgeon: Iran Ouch, MD;  Location: [facility] INVASIVE CV LAB;  Service: Cardiovascular;  Laterality: N/A;   CARDIAC CATHETERIZATION N/A 02/26/2016   Procedure: Coronary Stent Intervention;  Surgeon: Iran Ouch, MD;  Location: [facility] INVASIVE CV LAB;  Service: Cardiovascular;  Laterality: N/A;   CORONARY STENT PLACEMENT     ESOPHAGOGASTRODUODENOSCOPY     UMBILICAL HERNIA REPAIR     XI ROBOTIC ASSISTED INGUINAL HERNIA REPAIR WITH MESH Left 08/31/2020   Procedure: XI ROBOTIC ASSISTED LEFT INGUINAL HERNIA REPAIR TAPP;  Surgeon: Luretha Murphy, MD;  Location: [facility];  Service: General;  Laterality: Left;    Family History  Problem Relation Age of Onset   Heart disease Father 76       Before age 86- Open  Heart surgery   Hyperlipidemia Mother     Social History   Socioeconomic History   Marital status: Married    Spouse name: Not on file   Number of children: Not on file   Years of education: Not on file   Highest education level: Not on file  Occupational History   Not on file  Tobacco Use   Smoking status: Every Day    Packs/day: 0.25    Years: 47.00    Pack years: 11.75    Types: Cigarettes   Smokeless tobacco: Current    Types: Chew  Vaping Use   Vaping Use: Never used  Substance and Sexual Activity   Alcohol use: Yes    Alcohol/week: 2.0 standard drinks    Types: 2 Cans of beer per week    Comment: occ   Drug use: No   Sexual activity: Not on file  Other Topics Concern   Not on file  Social History Narrative   No living will    Would want wife, then son, would be health care POA   Would accept resuscitation attempts--but no prolonged artificial ventilation   Not sure about tube feeds   Social Determinants of Health   Financial Resource Strain: Not on file  Food Insecurity: Not on file  Transportation Needs: Not on file  Physical Activity: Not on file  Stress: Not on file  Social Connections: Not on file  Intimate Partner Violence: Not on file    Outpatient Medications Prior to Visit  Medication Sig Dispense Refill   aspirin EC 81 MG tablet Take 1 tablet (81 mg total) by mouth daily. 90 tablet 3   HYDROcodone-acetaminophen (NORCO/VICODIN) 5-325 MG tablet Take 1 tablet by mouth every 6 (six) hours as needed for moderate pain. 15 tablet 0   latanoprost (XALATAN) 0.005 % ophthalmic solution Place 1 drop into the right eye at bedtime.     metoprolol tartrate (LOPRESSOR) 25 MG tablet TAKE 1/2 TABLET(12.5 MG) BY MOUTH TWICE DAILY 90 tablet 3   nitroGLYCERIN (NITROSTAT) 0.4 MG SL tablet Place 1 tablet (0.4 mg total) under the tongue every 5 (five) minutes as needed for chest pain. 25 tablet 2   pantoprazole (PROTONIX) 40 MG tablet TAKE 1  TABLET(40 MG) BY MOUTH DAILY (Patient taking differently: Take 40 mg by mouth daily.) 90 tablet 3   rosuvastatin (CRESTOR) 40 MG tablet TAKE 1 TABLET(40 MG) BY MOUTH DAILY (Patient taking differently: Take 40 mg by mouth daily.) 90 tablet 3   No facility-administered medications prior to visit.    No Known Allergies  Review of Systems  Constitutional: Negative.   HENT:  Positive for congestion and postnasal drip.   Respiratory:  Positive for cough, shortness of breath and wheezing.   Cardiovascular: Negative.   Endocrine: Negative.   Musculoskeletal: Negative.   Skin: Negative.   Allergic/Immunologic: Negative.   Neurological: Negative.   Psychiatric/Behavioral: Negative.    All other systems reviewed and are negative.     Objective:    Physical Exam Vitals and nursing note reviewed.  Constitutional:      Appearance: Normal appearance.  HENT:     Right Ear: Tympanic membrane and ear canal normal.     Left Ear: Tympanic membrane and ear canal normal.  Cardiovascular:     Rate and Rhythm: Normal rate and regular rhythm.     Pulses: Normal pulses.     Heart sounds: Normal heart sounds.  Pulmonary:     Effort: Pulmonary effort is normal.     Comments: Mild expiratory wheezing Musculoskeletal:        General: Normal range of motion.     Cervical back: Normal range of motion and neck supple.  Skin:    General: Skin is warm and dry.  Neurological:     General: No focal deficit present.     Mental Status: He is alert and oriented to person, place, and time.  Psychiatric:        Mood and Affect: Mood normal.        Behavior: Behavior normal.   BP (!) 118/58    Pulse 88    Temp 98.4 F (36.9 C) (Oral)    Ht 5\' 11"  (1.803 m)    Wt 162 lb 12.8 oz (73.8 kg)    SpO2 95%    BMI 22.71 kg/m  Wt Readings from Last 3 Encounters:  05/04/21 162 lb 12.8 oz (73.8 kg)  08/21/20 177 lb (80.3 kg)  06/01/20 173 lb (78.5 kg)    Health Maintenance Due  Topic Date Due   Hepatitis C  Screening  Never done   Zoster Vaccines- Shingrix (1 of 2) Never done   TETANUS/TDAP  11/24/2007   Pneumonia Vaccine 27+ Years old (2 - PPSV23 if available, else PCV20) 10/17/2017  COLONOSCOPY (Pts 45-3258yrs Insurance coverage will need to be confirmed)  09/15/2019   INFLUENZA VACCINE  11/23/2020    There are no preventive care reminders to display for this patient.   Lab Results  Component Value Date   TSH 1.57 03/19/2019   Lab Results  Component Value Date   WBC 13.8 (H) 09/01/2020   HGB 13.6 09/01/2020   HCT 40.5 09/01/2020   MCV 93.5 09/01/2020   PLT 228 09/01/2020   Lab Results  Component Value Date   NA 141 09/01/2020   K 4.7 09/01/2020   CO2 30 09/01/2020   GLUCOSE 140 (H) 09/01/2020   BUN 17 09/01/2020   CREATININE 1.21 09/01/2020   BILITOT 0.8 05/27/2019   ALKPHOS 76 05/27/2019   AST 27 05/27/2019   ALT 16 05/27/2019   PROT 6.8 05/27/2019   ALBUMIN 4.7 05/27/2019   CALCIUM 9.3 09/01/2020   ANIONGAP 4 (L) 09/01/2020   GFR 65.95 03/19/2019   Lab Results  Component Value Date   CHOL 135 05/27/2019   Lab Results  Component Value Date   HDL 76 05/27/2019   Lab Results  Component Value Date   LDLCALC 36 05/27/2019   Lab Results  Component Value Date   TRIG 134 05/27/2019   Lab Results  Component Value Date   CHOLHDL 1.8 05/27/2019   Lab Results  Component Value Date   HGBA1C 5.5 02/26/2016       Assessment & Plan:   Problem List Items Addressed This Visit     TOBACCO ABUSE   Relevant Orders   DG Chest 2 View   Other Visit Diagnoses     Cough in adult    -  Primary   Relevant Orders   DG Chest 2 View   Wheezing       Relevant Orders   DG Chest 2 View   Acute bronchiolitis due to unspecified organism       Relevant Orders   DG Chest 2 View        Meds ordered this encounter  Medications   amoxicillin-clavulanate (AUGMENTIN) 875-125 MG tablet    Sig: Take 1 tablet by mouth 2 (two) times daily.    Dispense:  10 tablet     Refill:  0   predniSONE (STERAPRED UNI-PAK 21 TAB) 10 MG (21) TBPK tablet    Sig: As directed    Dispense:  21 tablet    Refill:  0   Call the office if symptoms worsen or persist. Recheck as scheduled and sooner as needed.   Eulis FosterPadonda B Aadvik Roker, FNP

## 2021-05-04 NOTE — Progress Notes (Signed)
HPI:FU coronary disease. He underwent cardiac catheterization in 2007 and had a bare-metal stent to his LAD. Patient had repeat catheterization November 2017 and was found to have an 80% posterior lateral, significant RCA disease and ejection fraction 55-65%. Patient had 3 drug-eluting stents placed in the right coronary artery at that time. Abdominal ultrasound May 2018 showed no aneurysm.  Nuclear study January 2020 showed ejection fraction 62% with no ischemia or infarction.  Also with cerebrovascular disease followed by vascular surgery. Since last seen, he denies dyspnea, chest pain, palpitations or syncope.  Current Outpatient Medications  Medication Sig Dispense Refill   aspirin EC 81 MG tablet Take 1 tablet (81 mg total) by mouth daily. 90 tablet 3   latanoprost (XALATAN) 0.005 % ophthalmic solution Place 1 drop into the right eye at bedtime.     metoprolol tartrate (LOPRESSOR) 25 MG tablet TAKE 1/2 TABLET(12.5 MG) BY MOUTH TWICE DAILY 90 tablet 3   pantoprazole (PROTONIX) 40 MG tablet TAKE 1 TABLET(40 MG) BY MOUTH DAILY (Patient taking differently: Take 40 mg by mouth daily.) 90 tablet 3   rosuvastatin (CRESTOR) 40 MG tablet TAKE 1 TABLET(40 MG) BY MOUTH DAILY (Patient taking differently: Take 40 mg by mouth daily.) 90 tablet 3   amoxicillin-clavulanate (AUGMENTIN) 875-125 MG tablet Take 1 tablet by mouth 2 (two) times daily. (Patient not taking: Reported on 05/13/2021) 10 tablet 0   HYDROcodone-acetaminophen (NORCO/VICODIN) 5-325 MG tablet Take 1 tablet by mouth every 6 (six) hours as needed for moderate pain. (Patient not taking: Reported on 05/13/2021) 15 tablet 0   nitroGLYCERIN (NITROSTAT) 0.4 MG SL tablet Place 1 tablet (0.4 mg total) under the tongue every 5 (five) minutes as needed for chest pain. 25 tablet 2   predniSONE (STERAPRED UNI-PAK 21 TAB) 10 MG (21) TBPK tablet As directed (Patient not taking: Reported on 05/13/2021) 21 tablet 0   No current facility-administered  medications for this visit.     Past Medical History:  Diagnosis Date   BARRETTS ESOPHAGUS    Carotid artery occlusion    COLONIC POLYPS, HX OF    COPD    pt denies    CORONARY ARTERY DISEASE    a. 2007: cath showing 95% stenosis proximal LAD (BMS placed), 40% D1, and 75% RCA stenosis. b. NST 08/2008: no evidence of ischemia c. NST 04/2012: no evidence of ischemia.   GERD    GLAUCOMA    HYPERCHOLESTEROLEMIA    HYPERLIPIDEMIA    NSTEMI (non-ST elevated myocardial infarction) ([place]) 02/25/2016   Other constipation    OTITIS EXTERNA    Peripheral vascular disease ([place])    pt denies    Thoracic aorta atherosclerosis [facility])     Past Surgical History:  Procedure Laterality Date   CARDIAC CATHETERIZATION N/A 02/26/2016   Procedure: Left Heart Cath and Coronary Angiography;  Surgeon: Wellington Hampshire, MD;  Location: [place] CV LAB;  Service: Cardiovascular;  Laterality: N/A;   CARDIAC CATHETERIZATION N/A 02/26/2016   Procedure: Coronary Stent Intervention;  Surgeon: Wellington Hampshire, MD;  Location: [place] CV LAB;  Service: Cardiovascular;  Laterality: N/A;   CORONARY STENT PLACEMENT     ESOPHAGOGASTRODUODENOSCOPY     UMBILICAL HERNIA REPAIR     XI ROBOTIC ASSISTED INGUINAL HERNIA REPAIR WITH MESH Left 08/31/2020   Procedure: XI ROBOTIC ASSISTED LEFT INGUINAL HERNIA REPAIR TAPP;  Surgeon: Johnathan Hausen, MD;  Location: [facility];  Service: General;  Laterality: Left;    Social History   Socioeconomic  History   Marital status: Married    Spouse name: Not on file   Number of children: Not on file   Years of education: Not on file   Highest education level: Not on file  Occupational History   Not on file  Tobacco Use   Smoking status: Every Day    Packs/day: 0.25    Years: 47.00    Pack years: 11.75    Types: Cigarettes   Smokeless tobacco: Current    Types: Chew  Vaping Use   Vaping Use: Never used  Substance and Sexual Activity   Alcohol use: Yes    Alcohol/week:  2.0 standard drinks    Types: 2 Cans of beer per week    Comment: occ   Drug use: No   Sexual activity: Not on file  Other Topics Concern   Not on file  Social History Narrative   No living will    Would want wife, then son, would be health care POA   Would accept resuscitation attempts--but no prolonged artificial ventilation   Not sure about tube feeds   Social Determinants of Health   Financial Resource Strain: Not on file  Food Insecurity: Not on file  Transportation Needs: Not on file  Physical Activity: Not on file  Stress: Not on file  Social Connections: Not on file  Intimate Partner Violence: Not on file    Family History  Problem Relation Age of Onset   Heart disease Father 104       Before age 20- Open Heart surgery   Hyperlipidemia Mother     ROS: no fevers or chills, productive cough, hemoptysis, dysphasia, odynophagia, melena, hematochezia, dysuria, hematuria, rash, seizure activity, orthopnea, PND, pedal edema, claudication. Remaining systems are negative.  Physical Exam: Well-developed well-nourished in no acute distress.  Skin is warm and dry.  HEENT is normal.  Neck is supple.  Left carotid bruit Chest is clear to auscultation with normal expansion.  Cardiovascular exam is regular rate and rhythm.  Abdominal exam nontender or distended. No masses palpated. Extremities show no edema. neuro grossly intact  ECG-normal sinus rhythm at a rate of 89, no ST changes.  Personally reviewed  A/P  1 coronary artery disease-patient denies recurrent chest pain.  Continue medical therapy with aspirin and statin.  2 hypertension-blood pressure controlled.  Continue present medical regimen.  3 hyperlipidemia-continue statin.  Check lipids and liver.  4 tobacco abuse-patient again counseled on discontinuing.  5 carotid artery disease-Per vascular surgery.  Kirk Ruths, MD

## 2021-05-13 ENCOUNTER — Ambulatory Visit: Payer: PPO | Admitting: Cardiology

## 2021-05-13 ENCOUNTER — Other Ambulatory Visit: Payer: Self-pay

## 2021-05-13 ENCOUNTER — Encounter: Payer: Self-pay | Admitting: Cardiology

## 2021-05-13 VITALS — BP 120/66 | HR 89 | Ht 71.0 in | Wt 162.8 lb

## 2021-05-13 DIAGNOSIS — I6523 Occlusion and stenosis of bilateral carotid arteries: Secondary | ICD-10-CM

## 2021-05-13 DIAGNOSIS — I10 Essential (primary) hypertension: Secondary | ICD-10-CM | POA: Diagnosis not present

## 2021-05-13 DIAGNOSIS — E785 Hyperlipidemia, unspecified: Secondary | ICD-10-CM | POA: Diagnosis not present

## 2021-05-13 DIAGNOSIS — I2510 Atherosclerotic heart disease of native coronary artery without angina pectoris: Secondary | ICD-10-CM | POA: Diagnosis not present

## 2021-05-13 DIAGNOSIS — Z9861 Coronary angioplasty status: Secondary | ICD-10-CM | POA: Diagnosis not present

## 2021-05-13 LAB — HEPATIC FUNCTION PANEL
ALT: 22 IU/L (ref 0–44)
AST: 22 IU/L (ref 0–40)
Albumin: 4.3 g/dL (ref 3.8–4.8)
Alkaline Phosphatase: 85 IU/L (ref 44–121)
Bilirubin Total: 0.6 mg/dL (ref 0.0–1.2)
Bilirubin, Direct: 0.19 mg/dL (ref 0.00–0.40)
Total Protein: 6.9 g/dL (ref 6.0–8.5)

## 2021-05-13 LAB — LIPID PANEL
Chol/HDL Ratio: 2.8 ratio (ref 0.0–5.0)
Cholesterol, Total: 135 mg/dL (ref 100–199)
HDL: 49 mg/dL (ref >39)
LDL Chol Calc (NIH): 57 mg/dL (ref 0–99)
Triglycerides: 172 mg/dL — ABNORMAL HIGH (ref 0–149)
VLDL Cholesterol Cal: 29 mg/dL (ref 5–40)

## 2021-05-13 NOTE — Patient Instructions (Signed)

## 2021-06-08 ENCOUNTER — Telehealth: Payer: Self-pay | Admitting: *Deleted

## 2021-06-08 ENCOUNTER — Other Ambulatory Visit: Payer: Self-pay

## 2021-06-08 DIAGNOSIS — J219 Acute bronchiolitis, unspecified: Secondary | ICD-10-CM

## 2021-06-08 NOTE — Telephone Encounter (Signed)
I ordered the chest xray after calling the patient, he wanted to have the labs and xray done at [facility].  Beverly Ferner,cma

## 2021-06-08 NOTE — Telephone Encounter (Signed)
No orders for repeat CXR, please place order & see if pt would rather have this done at his PCP office.

## 2021-06-08 NOTE — Telephone Encounter (Signed)
This message was sent to [facility] to handle. Ramces Shomaker,cma

## 2021-06-14 ENCOUNTER — Other Ambulatory Visit: Payer: Self-pay

## 2021-06-14 ENCOUNTER — Ambulatory Visit (INDEPENDENT_AMBULATORY_CARE_PROVIDER_SITE_OTHER): Payer: PPO

## 2021-06-14 ENCOUNTER — Other Ambulatory Visit: Payer: PPO

## 2021-06-14 DIAGNOSIS — J439 Emphysema, unspecified: Secondary | ICD-10-CM | POA: Diagnosis not present

## 2021-06-14 DIAGNOSIS — J219 Acute bronchiolitis, unspecified: Secondary | ICD-10-CM

## 2021-07-10 ENCOUNTER — Other Ambulatory Visit: Payer: Self-pay | Admitting: Cardiology

## 2021-07-12 ENCOUNTER — Other Ambulatory Visit: Payer: Self-pay | Admitting: Cardiology

## 2021-08-24 ENCOUNTER — Telehealth: Payer: Self-pay

## 2021-08-24 DIAGNOSIS — I2510 Atherosclerotic heart disease of native coronary artery without angina pectoris: Secondary | ICD-10-CM

## 2021-08-24 MED ORDER — ROSUVASTATIN CALCIUM 40 MG PO TABS: 40.0000 mg | ORAL_TABLET | Freq: Every day | ORAL | 3 refills | Status: DC

## 2021-08-24 NOTE — Telephone Encounter (Signed)
Patients wife called and requested a refill on Crestor for the patient.  ? ?Chart reviewed.  ? ?Rx(s) sent to pharmacy electronically. ?

## 2021-11-15 ENCOUNTER — Other Ambulatory Visit: Payer: Self-pay | Admitting: Cardiology

## 2021-11-17 DIAGNOSIS — T1501XA Foreign body in cornea, right eye, initial encounter: Secondary | ICD-10-CM | POA: Diagnosis not present

## 2021-11-19 DIAGNOSIS — T1500XA Foreign body in cornea, unspecified eye, initial encounter: Secondary | ICD-10-CM | POA: Diagnosis not present

## 2021-12-16 ENCOUNTER — Telehealth: Payer: Self-pay

## 2021-12-16 NOTE — Telephone Encounter (Signed)
Last OV 06/01/20.  Pt notified of above & appt scheduled for 01/05/22

## 2022-01-05 ENCOUNTER — Ambulatory Visit (INDEPENDENT_AMBULATORY_CARE_PROVIDER_SITE_OTHER): Payer: PPO | Admitting: Internal Medicine

## 2022-01-05 ENCOUNTER — Encounter: Payer: Self-pay | Admitting: Internal Medicine

## 2022-01-05 VITALS — BP 126/74 | HR 72 | Temp 97.9°F | Ht 68.5 in | Wt 166.0 lb

## 2022-01-05 DIAGNOSIS — J449 Chronic obstructive pulmonary disease, unspecified: Secondary | ICD-10-CM | POA: Diagnosis not present

## 2022-01-05 DIAGNOSIS — Z1211 Encounter for screening for malignant neoplasm of colon: Secondary | ICD-10-CM | POA: Diagnosis not present

## 2022-01-05 DIAGNOSIS — I6523 Occlusion and stenosis of bilateral carotid arteries: Secondary | ICD-10-CM

## 2022-01-05 DIAGNOSIS — Z125 Encounter for screening for malignant neoplasm of prostate: Secondary | ICD-10-CM | POA: Diagnosis not present

## 2022-01-05 DIAGNOSIS — Z720 Tobacco use: Secondary | ICD-10-CM | POA: Diagnosis not present

## 2022-01-05 DIAGNOSIS — Z23 Encounter for immunization: Secondary | ICD-10-CM

## 2022-01-05 DIAGNOSIS — I2510 Atherosclerotic heart disease of native coronary artery without angina pectoris: Secondary | ICD-10-CM

## 2022-01-05 DIAGNOSIS — Z9861 Coronary angioplasty status: Secondary | ICD-10-CM

## 2022-01-05 DIAGNOSIS — Z0000 Encounter for general adult medical examination without abnormal findings: Secondary | ICD-10-CM

## 2022-01-05 LAB — CBC
HCT: 43.3 % (ref 39.0–52.0)
Hemoglobin: 14.5 g/dL (ref 13.0–17.0)
MCHC: 33.4 g/dL (ref 30.0–36.0)
MCV: 92.6 fl (ref 78.0–100.0)
Platelets: 207 10*3/uL (ref 150.0–400.0)
RBC: 4.67 Mil/uL (ref 4.22–5.81)
RDW: 13.3 % (ref 11.5–15.5)
WBC: 7.2 10*3/uL (ref 4.0–10.5)

## 2022-01-05 LAB — LIPID PANEL
Cholesterol: 121 mg/dL (ref 0–200)
HDL: 41.1 mg/dL (ref >39.00)
LDL Cholesterol: 46 mg/dL (ref 0–99)
NonHDL: 79.56
Total CHOL/HDL Ratio: 3
Triglycerides: 166 mg/dL — ABNORMAL HIGH (ref 0.0–149.0)
VLDL: 33.2 mg/dL (ref 0.0–40.0)

## 2022-01-05 LAB — COMPREHENSIVE METABOLIC PANEL
ALT: 15 U/L (ref 0–53)
AST: 25 U/L (ref 0–37)
Albumin: 4.1 g/dL (ref 3.5–5.2)
Alkaline Phosphatase: 79 U/L (ref 39–117)
BUN: 15 mg/dL (ref 6–23)
CO2: 29 mEq/L (ref 19–32)
Calcium: 9.3 mg/dL (ref 8.4–10.5)
Chloride: 104 mEq/L (ref 96–112)
Creatinine, Ser: 1.11 mg/dL (ref 0.40–1.50)
GFR: 67.31 mL/min (ref >60.00)
Glucose, Bld: 79 mg/dL (ref 70–99)
Potassium: 4.1 mEq/L (ref 3.5–5.1)
Sodium: 142 mEq/L (ref 135–145)
Total Bilirubin: 0.5 mg/dL (ref 0.2–1.2)
Total Protein: 7 g/dL (ref 6.0–8.3)

## 2022-01-05 LAB — PSA, MEDICARE: PSA: 1.71 ng/ml (ref 0.10–4.00)

## 2022-01-05 NOTE — Assessment & Plan Note (Signed)
Counseled on cessation Lung cancer screening

## 2022-01-05 NOTE — Assessment & Plan Note (Signed)
Is on asa and statin No neuro symptoms

## 2022-01-05 NOTE — Addendum Note (Signed)
Addended by: Eual Fines on: 01/05/2022 11:36 AM   Modules accepted: Orders

## 2022-01-05 NOTE — Assessment & Plan Note (Signed)
I have personally reviewed the Medicare Annual Wellness questionnaire and have noted 1. The patient's medical and social history 2. Their use of alcohol, tobacco or illicit drugs 3. Their current medications and supplements 4. The patient's functional ability including ADL's, fall risks, home safety risks and hearing or visual             impairment. 5. Diet and physical activities 6. Evidence for depression or mood disorders  The patients weight, height, BMI and visual acuity have been recorded in the chart I have made referrals, counseling and provided education to the patient based review of the above and I have provided the pt with a written personalized care plan for preventive services.  I have provided you with a copy of your personalized plan for preventive services. Please take the time to review along with your updated medication list.  Flu vaccine today Prefers no COVID vaccine Tetanus booster at pharmacy Will set up colon Discussed PSA--will check one today Walks a lot --discussed adding resistance

## 2022-01-05 NOTE — Progress Notes (Signed)
Subjective:    Patient ID: [name] [name], male    DOB: [birth year], 71 y.o.   MRN: [id number]  HPI Here for Medicare wellness visit and follow up of chronic health conditions Reviewed advanced directives Reviewed other doctors---Dr Porfilio--ophthal, Dr Crenshaw--cardiology, Dr Hildred Alamin No surgery or hospitalizations in past year Vision is fair---may need cataract done on right Hearing is okay Still smokes Occasional beer No set exercise--but walks a lot No falls No depression or anhedonia Independent with instrumental ADLs No sig memory problems  Some trouble with his left neck over the past month May be twisting neck when watching TV Notices it in evening when he sits or goes to bed Used OTC cream from friend--it helps 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 90 tablet 3   latanoprost (XALATAN) 0.005 % ophthalmic solution Place 1 drop into the right eye at bedtime.     metoprolol tartrate (LOPRESSOR) 25 MG tablet TAKE 1/2 TABLET(12.5 MG) BY MOUTH TWICE DAILY 90 tablet 3   nitroGLYCERIN (NITROSTAT) 0.4 MG SL tablet Place 1 tablet (0.4 mg total) under the tongue every 5 (five) minutes as needed for chest pain. 25 tablet 2   pantoprazole (PROTONIX) 40 MG tablet TAKE 1 TABLET(40 MG) BY MOUTH DAILY 90 tablet 3   rosuvastatin (CRESTOR) 40 MG tablet Take 1 tablet (40 mg total) by mouth daily. 90 tablet 3   No current facility-administered medications on file prior to visit.    No Known Allergies  Past Medical History:  Diagnosis Date   BARRETTS ESOPHAGUS    Carotid artery occlusion    COLONIC POLYPS, HX OF     COPD    pt denies    CORONARY ARTERY DISEASE    a. 2007: cath showing 95% stenosis proximal LAD (BMS placed), 40% D1, and 75% RCA stenosis. b. NST 08/2008: no evidence of ischemia c. NST 04/2012: no evidence of ischemia.   GERD    GLAUCOMA    HYPERCHOLESTEROLEMIA    HYPERLIPIDEMIA    NSTEMI (non-ST elevated myocardial infarction) (HCC) 02/25/2016   Other constipation    OTITIS EXTERNA    Peripheral vascular disease (HCC)    pt denies    Thoracic aorta atherosclerosis [facility])     Past Surgical History:  Procedure Laterality Date   CARDIAC CATHETERIZATION N/A 02/26/2016   Procedure: Left Heart Cath and Coronary Angiography;  Surgeon: Iran Ouch, MD;  Location: [facility] INVASIVE CV LAB;  Service: Cardiovascular;  Laterality: N/A;   CARDIAC CATHETERIZATION N/A 02/26/2016   Procedure: Coronary Stent Intervention;  Surgeon: Iran Ouch, MD;  Location: [facility] INVASIVE CV LAB;  Service: Cardiovascular;  Laterality: N/A;   CORONARY STENT PLACEMENT     ESOPHAGOGASTRODUODENOSCOPY     UMBILICAL HERNIA REPAIR     XI ROBOTIC ASSISTED INGUINAL HERNIA REPAIR WITH MESH Left 08/31/2020   Procedure: XI ROBOTIC ASSISTED LEFT INGUINAL HERNIA REPAIR TAPP;  Surgeon: Luretha Murphy, MD;  Location: [facility];  Service: General;  Laterality: Left;    Family History  Problem Relation Age of Onset   Heart disease Father 77       Before age 70- Open Heart  surgery   Hyperlipidemia Mother     Social History   Socioeconomic History   Marital status: Married    Spouse name: Not on file   Number of children: Not on file   Years of education: Not on file   Highest education level: Not on file  Occupational History   Not on file  Tobacco Use   Smoking status: Every Day    Packs/day: 0.25    Years: 47.00    Total pack years: 11.75    Types: Cigarettes    Passive exposure: Never   Smokeless tobacco: Current    Types: Chew  Vaping Use   Vaping Use: Never used  Substance and Sexual Activity   Alcohol  use: Yes    Alcohol/week: 2.0 standard drinks of alcohol    Types: 2 Cans of beer per week    Comment: occ   Drug use: No   Sexual activity: Not on file  Other Topics Concern   Not on file  Social History Narrative   No living will    Would want wife, then son, would be health care POA   Would accept resuscitation attempts--but no prolonged artificial ventilation   No  tube feeds if cognitively unaware   Social Determinants of Health   Financial Resource Strain: Not on file  Food Insecurity: Not on file  Transportation Needs: Not on file  Physical Activity: Not on file  Stress: Not on file  Social Connections: Not on file  Intimate Partner Violence: Not on file   Review of Systems Appetite is okay Had lost weight but now back up again Sleeps well Wears seat belt Only 3 teeth--upper plate, lower partial. No recent dental visits Has long standing bump on posterior neck--no new suspicious spots No sig back or joint pains now No heartburn or dysphagia Bowels are slow at times--takes med occasionally Voids okay----stream is fine and empties well    Objective:   Physical Exam Constitutional:      Appearance: Normal appearance.  HENT:     Mouth/Throat:     Comments: No lesions Eyes:     Conjunctiva/sclera: Conjunctivae normal.     Pupils: Pupils are equal, round, and reactive to light.  Cardiovascular:     Rate and Rhythm: Normal rate and regular rhythm.     Heart sounds: No murmur heard.    No gallop.     Comments: Faint pedal pulses Pulmonary:     Effort: Pulmonary effort is normal.     Breath sounds: No wheezing or rales.     Comments: Decreased breath sounds but clear Abdominal:     Palpations: Abdomen is soft.     Tenderness: There is no abdominal tenderness.  Musculoskeletal:     Cervical back: Neck supple.     Right lower leg: No edema.     Left lower leg: No edema.     Comments: Superficial varicosities left calf  Lymphadenopathy:     Cervical: No  cervical adenopathy.  Skin:    Findings: No lesion or rash.  Neurological:     General: No focal deficit present.     Mental Status: He is alert and oriented to person, place, and time.     Comments: Mini-cog normal  Psychiatric:        Mood and Affect: Mood normal.        Behavior: Behavior normal.            Assessment & Plan:

## 2022-01-05 NOTE — Progress Notes (Signed)
Hearing Screening - Comments:: Passed whisper test Vision Screening - Comments:: August 2023  

## 2022-01-05 NOTE — Assessment & Plan Note (Signed)
No angina On asa 81, metoprolol 12/5mg  bid, rosuvasatatin 40

## 2022-01-05 NOTE — Assessment & Plan Note (Signed)
Still smoking---counseled No meds

## 2022-01-19 DIAGNOSIS — H401113 Primary open-angle glaucoma, right eye, severe stage: Secondary | ICD-10-CM | POA: Diagnosis not present

## 2022-02-23 DIAGNOSIS — H2511 Age-related nuclear cataract, right eye: Secondary | ICD-10-CM | POA: Diagnosis not present

## 2022-03-02 ENCOUNTER — Encounter: Payer: Self-pay | Admitting: Ophthalmology

## 2022-03-03 NOTE — Discharge Instructions (Signed)

## 2022-03-08 ENCOUNTER — Ambulatory Visit: Payer: PPO | Admitting: Anesthesiology

## 2022-03-08 ENCOUNTER — Ambulatory Visit
Admission: RE | Admit: 2022-03-08 | Discharge: 2022-03-08 | Disposition: A | Discharge status: Home / Self Care | Payer: PPO | Attending: Ophthalmology | Admitting: Ophthalmology

## 2022-03-08 ENCOUNTER — Encounter: Payer: Self-pay | Admitting: Ophthalmology

## 2022-03-08 ENCOUNTER — Other Ambulatory Visit: Payer: Self-pay

## 2022-03-08 ENCOUNTER — Encounter
Admission: RE | Disposition: A | Discharge status: Home / Self Care | Payer: Self-pay | Source: Home / Self Care | Attending: Ophthalmology

## 2022-03-08 DIAGNOSIS — I2510 Atherosclerotic heart disease of native coronary artery without angina pectoris: Secondary | ICD-10-CM | POA: Diagnosis not present

## 2022-03-08 DIAGNOSIS — I739 Peripheral vascular disease, unspecified: Secondary | ICD-10-CM | POA: Insufficient documentation

## 2022-03-08 DIAGNOSIS — J449 Chronic obstructive pulmonary disease, unspecified: Secondary | ICD-10-CM | POA: Insufficient documentation

## 2022-03-08 DIAGNOSIS — H2511 Age-related nuclear cataract, right eye: Secondary | ICD-10-CM | POA: Diagnosis not present

## 2022-03-08 DIAGNOSIS — I10 Essential (primary) hypertension: Secondary | ICD-10-CM | POA: Diagnosis not present

## 2022-03-08 DIAGNOSIS — I252 Old myocardial infarction: Secondary | ICD-10-CM | POA: Insufficient documentation

## 2022-03-08 DIAGNOSIS — F17210 Nicotine dependence, cigarettes, uncomplicated: Secondary | ICD-10-CM | POA: Diagnosis not present

## 2022-03-08 HISTORY — PX: CATARACT EXTRACTION W/PHACO: SHX586

## 2022-03-08 HISTORY — DX: Presence of dental prosthetic device (complete) (partial): Z97.2

## 2022-03-08 SURGERY — PHACOEMULSIFICATION, CATARACT, WITH IOL INSERTION
Anesthesia: General | Site: Eye | Laterality: Right

## 2022-03-08 MED ORDER — MOXIFLOXACIN HCL 0.5 % OP SOLN
OPHTHALMIC | Status: DC | PRN
  Administered 2022-03-08: .2 mL via OPHTHALMIC

## 2022-03-08 MED ORDER — SIGHTPATH DOSE#1 BSS IO SOLN
INTRAOCULAR | Status: DC | PRN
  Administered 2022-03-08: 59 mL via OPHTHALMIC

## 2022-03-08 MED ORDER — LACTATED RINGERS IV SOLN: INTRAVENOUS | Status: DC

## 2022-03-08 MED ORDER — BRIMONIDINE TARTRATE-TIMOLOL 0.2-0.5 % OP SOLN
OPHTHALMIC | Status: DC | PRN
  Administered 2022-03-08: 1 [drp] via OPHTHALMIC

## 2022-03-08 MED ORDER — SIGHTPATH DOSE#1 BSS IO SOLN
INTRAOCULAR | Status: DC | PRN
  Administered 2022-03-08: 15 mL via INTRAOCULAR

## 2022-03-08 MED ORDER — TETRACAINE HCL 0.5 % OP SOLN
1.0000 [drp] | OPHTHALMIC | Status: DC | PRN
  Administered 2022-03-08 (×3): 1 [drp] via OPHTHALMIC

## 2022-03-08 MED ORDER — SIGHTPATH DOSE#1 NA CHONDROIT SULF-NA HYALURON 40-17 MG/ML IO SOLN
INTRAOCULAR | Status: DC | PRN
  Administered 2022-03-08: 1 mL via INTRAOCULAR

## 2022-03-08 MED ORDER — SIGHTPATH DOSE#1 BSS IO SOLN
INTRAOCULAR | Status: DC | PRN
  Administered 2022-03-08: 2 mL

## 2022-03-08 MED ORDER — ARMC OPHTHALMIC DILATING DROPS
1.0000 | OPHTHALMIC | Status: DC | PRN
  Administered 2022-03-08 (×3): 1 via OPHTHALMIC

## 2022-03-08 MED ORDER — MIDAZOLAM HCL 2 MG/2ML IJ SOLN
INTRAMUSCULAR | Status: DC | PRN
  Administered 2022-03-08: 1 mg via INTRAVENOUS

## 2022-03-08 MED ORDER — FENTANYL CITRATE (PF) 100 MCG/2ML IJ SOLN
INTRAMUSCULAR | Status: DC | PRN
  Administered 2022-03-08: 50 ug via INTRAVENOUS

## 2022-03-08 SURGICAL SUPPLY — 15 items
CANNULA ANT/CHMB 27G (MISCELLANEOUS) IMPLANT
CANNULA ANT/CHMB 27GA (MISCELLANEOUS) IMPLANT
CATARACT SUITE SIGHTPATH (MISCELLANEOUS) ×1 IMPLANT
FEE CATARACT SUITE SIGHTPATH (MISCELLANEOUS) ×2 IMPLANT
GLOVE SURG ENC TEXT LTX SZ8 (GLOVE) ×2 IMPLANT
GLOVE SURG TRIUMPH 8.0 PF LTX (GLOVE) ×2 IMPLANT
LENS IOL TECNIS EYHANCE 19.0 (Intraocular Lens) IMPLANT
NDL FILTER BLUNT 18X1 1/2 (NEEDLE) ×2 IMPLANT
NEEDLE FILTER BLUNT 18X1 1/2 (NEEDLE) ×1 IMPLANT
PACK VIT ANT 23G (MISCELLANEOUS) IMPLANT
RING MALYGIN (MISCELLANEOUS) IMPLANT
SUT ETHILON 10-0 CS-B-6CS-B-6 (SUTURE)
SUTURE EHLN 10-0 CS-B-6CS-B-6 (SUTURE) IMPLANT
SYR 3ML LL SCALE MARK (SYRINGE) ×2 IMPLANT
WATER STERILE IRR 250ML POUR (IV SOLUTION) ×2 IMPLANT

## 2022-03-08 NOTE — Anesthesia Postprocedure Evaluation (Signed)
Anesthesia Post Note  Patient: [name] [name]  Procedure(s) Performed: CATARACT EXTRACTION PHACO AND INTRAOCULAR LENS PLACEMENT (IOC) RIGHT 8.94 00:47.2 (Right: Eye)  Patient location during evaluation: PACU Anesthesia Type: General Level of consciousness: awake and alert Pain management: pain level controlled Vital Signs Assessment: post-procedure vital signs reviewed and stable Respiratory status: spontaneous breathing, nonlabored ventilation, respiratory function stable and patient connected to nasal cannula oxygen Cardiovascular status: stable and blood pressure returned to baseline Postop Assessment: no apparent nausea or vomiting Anesthetic complications: no   No notable events documented.   Last Vitals:  Vitals:   03/08/22 0853 03/08/22 0858  BP: 126/74 133/89  Pulse: 67 65  Resp: 14 16  Temp: 36.6 C 36.6 C  SpO2: 99% 100%    Last Pain:  Vitals:   03/08/22 0858  TempSrc:   PainSc: 0-No pain                 Lenard Simmer

## 2022-03-08 NOTE — Transfer of Care (Signed)
Immediate Anesthesia Transfer of Care Note  Patient: [name] [name]  Procedure(s) Performed: CATARACT EXTRACTION PHACO AND INTRAOCULAR LENS PLACEMENT (IOC) RIGHT 8.94 00:47.2 (Right: Eye)  Patient Location: PACU  Anesthesia Type: General  Level of Consciousness: awake, alert  and patient cooperative  Airway and Oxygen Therapy: Patient Spontanous Breathing and Patient connected to supplemental oxygen  Post-op Assessment: Post-op Vital signs reviewed, Patient's Cardiovascular Status Stable, Respiratory Function Stable, Patent Airway and No signs of Nausea or vomiting  Post-op Vital Signs: Reviewed and stable  Complications: No notable events documented.

## 2022-03-08 NOTE — Op Note (Signed)
PREOPERATIVE DIAGNOSIS:  Nuclear sclerotic cataract of the right eye.   POSTOPERATIVE DIAGNOSIS:  Cataract   OPERATIVE PROCEDURE:ORPROCALL@   SURGEON:  Galen Manila, MD.   ANESTHESIA:  Anesthesiologist: Lenard Simmer, MD CRNA: Emeterio Reeve, CRNA  1.      Managed anesthesia care. 2.      0.82ml of Shugarcaine was instilled in the eye following the paracentesis.   COMPLICATIONS:  None.   TECHNIQUE:   Stop and chop   DESCRIPTION OF PROCEDURE:  The patient was examined and consented in the preoperative holding area where the aforementioned topical anesthesia was applied to the right eye and then brought back to the Operating Room where the right eye was prepped and draped in the usual sterile ophthalmic fashion and a lid speculum was placed. A paracentesis was created with the side port blade and the anterior chamber was filled with viscoelastic. A near clear corneal incision was performed with the steel keratome. A continuous curvilinear capsulorrhexis was performed with a cystotome followed by the capsulorrhexis forceps. Hydrodissection and hydrodelineation were carried out with BSS on a blunt cannula. The lens was removed in a stop and chop  technique and the remaining cortical material was removed with the irrigation-aspiration handpiece. The capsular bag was inflated with viscoelastic and the Technis [id number]  lens was placed in the capsular bag without complication. The remaining viscoelastic was removed from the eye with the irrigation-aspiration handpiece. The wounds were hydrated. The anterior chamber was flushed with BSS and the eye was inflated to physiologic pressure. 0.25ml of Vigamox was placed in the anterior chamber. The wounds were found to be water tight. The eye was dressed with Combigan. The patient was given protective glasses to wear throughout the day and a shield with which to sleep tonight. The patient was also given drops with which to begin a drop regimen today and  will follow-up with me in one day. Implant Name Type Inv. Item Serial No. Manufacturer Lot No. LRB No. Used Action  LENS IOL TECNIS EYHANCE 19.0 - [id number] Intraocular Lens LENS IOL TECNIS EYHANCE 19.0 [id number] [employer]  Right 1 Implanted   Procedure(s): CATARACT EXTRACTION PHACO AND INTRAOCULAR LENS PLACEMENT (IOC) RIGHT 8.94 00:47.2 (Right)  Electronically signed: Galen Manila 03/08/2022 8:52 AM

## 2022-03-08 NOTE — H&P (Signed)
[facility]   Primary Care Physician:  Karie Schwalbe, MD Ophthalmologist: Dr. Druscilla Brownie  Pre-Procedure History & Physical: HPI:  [name] [name] is a 71 y.o. male here for cataract surgery.   Past Medical History:  Diagnosis Date   BARRETTS ESOPHAGUS    Carotid artery occlusion    COLONIC POLYPS, HX OF    COPD    pt denies    CORONARY ARTERY DISEASE    a. 2007: cath showing 95% stenosis proximal LAD (BMS placed), 40% D1, and 75% RCA stenosis. b. NST 08/2008: no evidence of ischemia c. NST 04/2012: no evidence of ischemia.   GERD    GLAUCOMA    HYPERCHOLESTEROLEMIA    HYPERLIPIDEMIA    NSTEMI (non-ST elevated myocardial infarction) (HCC) 02/25/2016   Other constipation    OTITIS EXTERNA    Peripheral vascular disease (HCC)    pt denies    Thoracic aorta atherosclerosis (HCC)    Wears dentures    Full upper, partial lower    Past Surgical History:  Procedure Laterality Date   CARDIAC CATHETERIZATION N/A 02/26/2016   Procedure: Left Heart Cath and Coronary Angiography;  Surgeon: Iran Ouch, MD;  Location: [facility] IN[facility]IVE [facility];  Service: Cardiovascular;  Laterality: N/A;   CARDIAC CATHETERIZATION N/A 02/26/2016   Procedure: Coronary Stent Intervention;  Surgeon: Iran Ouch, MD;  Location: [facility] CV [facility];  Service: Cardiovascular;  Laterality: N/A;   CORONARY STENT PLACEMENT     ESOPHAGOGASTRODUODENOSCOPY     UMBILICAL HERNIA REPAIR     XI ROBOTIC ASSISTED INGUINAL HERNIA REPAIR WITH MESH Left 08/31/2020   Procedure: XI ROBOTIC ASSISTED LEFT INGUINAL HERNIA REPAIR TAPP;  Surgeon: Luretha Murphy, MD;  Location: [facility];  Service: General;  Laterality: Left;    Prior to Admission medications   Medication Sig Start Date End Date Taking? Authorizing Provider  aspirin EC 81 MG tablet Take 1 tablet (81 mg total) by mouth daily. 02/24/16  Yes Strader, [place] M, PA-C  latanoprost (XALATAN) 0.005 % ophthalmic solution Place 1 drop into the right eye at  bedtime. 05/28/20  Yes [provider]  metoprolol tartrate (LOPRESSOR) 25 MG tablet TAKE 1/2 TABLET(12.5 MG) BY MOUTH TWICE DAILY 11/15/21  Yes Lewayne Bunting, MD  Multiple Vitamin (MULTIVITAMIN) tablet Take 1 tablet by mouth daily.   Yes [provider]  nitroGLYCERIN (NITROSTAT) 0.4 MG SL tablet Place 1 tablet (0.4 mg total) under the tongue every 5 (five) minutes as needed for chest pain. 03/31/14  Yes Simmons, Brittainy M, PA-C  pantoprazole (PROTONIX) 40 MG tablet TAKE 1 TABLET(40 MG) BY MOUTH DAILY 07/12/21  Yes Crenshaw, Madolyn Frieze, MD  rosuvastatin (CRESTOR) 40 MG tablet Take 1 tablet (40 mg total) by mouth daily. 08/24/21  Yes Lewayne Bunting, MD    Allergies as of 01/20/2022   (No Known Allergies)    Family History  Problem Relation Age of Onset   Heart disease Father 7       Before age 76- Open Heart surgery   Hyperlipidemia Mother     Social History   Socioeconomic History   Marital status: Married    Spouse name: Not on file   Number of children: Not on file   Years of education: Not on file   Highest education level: Not on file  Occupational History   Not on file  Tobacco Use   Smoking status: Every Day    Packs/day: 1.00    Years: 55.00  Total pack years: 55.00    Types: Cigarettes    Passive exposure: Never   Smokeless tobacco: Current    Types: Snuff   Tobacco comments:    Started smoking around age 80  Vaping Use   Vaping Use: Never used  Substance and Sexual Activity   Alcohol use: Yes    Alcohol/week: 4.0 standard drinks of alcohol    Types: 4 Cans of beer per week    Comment: occ   Drug use: No   Sexual activity: Not on file  Other Topics Concern   Not on file  Social History Narrative   No living will    Would want wife, then son, would be health care POA   Would accept resuscitation attempts--but no prolonged artificial ventilation   No  tube feeds if cognitively unaware   Social Determinants of Health   Financial  Resource Strain: Not on file  Food Insecurity: Not on file  Transportation Needs: Not on file  Physical Activity: Not on file  Stress: Not on file  Social Connections: Not on file  Intimate Partner Violence: Not on file    Review of Systems: See HPI, otherwise negative ROS  Physical Exam: BP (!) 152/77   Pulse 64   Temp (!) 97.3 F (36.3 C) (Temporal)   Resp 18   Ht 5\' 11"  (1.803 m)   Wt 74 kg   SpO2 99%   BMI 22.75 kg/m  General:   Alert, cooperative in NAD Head:  Normocephalic and atraumatic. Respiratory:  Normal work of breathing. Cardiovascular:  RRR  Impression/Plan: [name] [name] is here for cataract surgery.  Risks, benefits, limitations, and alternatives regarding cataract surgery have been reviewed with the patient.  Questions have been answered.  All parties agreeable.   [name], MD  03/08/2022, 8:28 AM

## 2022-03-08 NOTE — Anesthesia Preprocedure Evaluation (Signed)
Anesthesia Evaluation  Patient identified by MRN, date of birth, ID band Patient awake    Reviewed: Allergy & Precautions, H&P , NPO status , Patient's Chart, lab work & pertinent test results, reviewed documented beta blocker date and time   History of Anesthesia Complications Negative for: history of anesthetic complications  Airway Mallampati: I  TM Distance: >3 FB Neck ROM: full    Dental  (+) Upper Dentures, Edentulous Upper, Partial Lower, Dental Advidsory Given   Pulmonary neg shortness of breath, COPD (mild), neg recent URI, Current Smoker   Pulmonary exam normal breath sounds clear to auscultation       Cardiovascular Exercise Tolerance: Good hypertension, (-) angina + CAD, + Past MI, + Cardiac Stents and + Peripheral Vascular Disease  Normal cardiovascular exam(-) dysrhythmias (-) Valvular Problems/Murmurs Rhythm:regular Rate:Normal     Neuro/Psych negative neurological ROS  negative psych ROS   GI/Hepatic Neg liver ROS,GERD  ,,  Endo/Other  negative endocrine ROS    Renal/GU negative Renal ROS  negative genitourinary   Musculoskeletal   Abdominal   Peds  Hematology negative hematology ROS (+)   Anesthesia Other Findings Past Medical History: No date: BARRETTS ESOPHAGUS No date: Carotid artery occlusion No date: COLONIC POLYPS, HX OF No date: COPD     Comment:  pt denies  No date: CORONARY ARTERY DISEASE     Comment:  a. 2007: cath showing 95% stenosis proximal LAD (BMS               placed), 40% D1, and 75% RCA stenosis. b. NST 08/2008: no              evidence of ischemia c. NST 04/2012: no evidence of               ischemia. No date: GERD No date: GLAUCOMA No date: HYPERCHOLESTEROLEMIA No date: HYPERLIPIDEMIA 02/25/2016: NSTEMI (non-ST elevated myocardial infarction) (HCC) No date: Other constipation No date: OTITIS EXTERNA No date: Peripheral vascular disease (HCC)     Comment:  pt denies   No date: Thoracic aorta atherosclerosis (HCC) No date: Wears dentures     Comment:  Full upper, partial lower   Reproductive/Obstetrics negative OB ROS                              Anesthesia Physical Anesthesia Plan  ASA: 3  Anesthesia Plan: General   Post-op Pain Management:    Induction: Intravenous  PONV Risk Score and Plan: 1 and Propofol infusion and TIVA  Airway Management Planned: Natural Airway and Nasal Cannula  Additional Equipment:   Intra-op Plan:   Post-operative Plan:   Informed Consent: I have reviewed the patients History and Physical, chart, labs and discussed the procedure including the risks, benefits and alternatives for the proposed anesthesia with the patient or authorized representative who has indicated his/her understanding and acceptance.     Dental Advisory Given  Plan Discussed with: Anesthesiologist, CRNA and Surgeon  Anesthesia Plan Comments:         Anesthesia Quick Evaluation  

## 2022-03-09 ENCOUNTER — Encounter: Payer: Self-pay | Admitting: Ophthalmology

## 2022-03-28 ENCOUNTER — Encounter: Payer: Self-pay | Admitting: Internal Medicine

## 2022-03-28 ENCOUNTER — Ambulatory Visit (INDEPENDENT_AMBULATORY_CARE_PROVIDER_SITE_OTHER): Payer: PPO | Admitting: Internal Medicine

## 2022-03-28 VITALS — BP 122/82 | HR 72 | Temp 97.4°F | Ht 68.5 in | Wt 168.0 lb

## 2022-03-28 DIAGNOSIS — S161XXA Strain of muscle, fascia and tendon at neck level, initial encounter: Secondary | ICD-10-CM

## 2022-03-28 MED ORDER — TIZANIDINE HCL 2 MG PO CAPS: 2.0000 mg | ORAL_CAPSULE | Freq: Every evening | ORAL | 1 refills | Status: DC | PRN

## 2022-03-28 NOTE — Assessment & Plan Note (Signed)
Nothing worrisome Left SCM insertion Asked him to try diclofenac gel tid  Can use ibuprofen 400-600 tid prn Tizanidine for bedtime as needed

## 2022-03-28 NOTE — Progress Notes (Signed)
Subjective:    Patient ID: [name] [name], male    DOB: 05-Jul-1950, 71 y.o.   MRN: [id number]  HPI Here due to neck pain  Has been having left neck pain--goes back a while Hard to turn neck--like when driving Bothers him at night  Has tried OTC topical treatments Had some left over pain pill---ibuprofen (helps a little) Heat not too helpful Hot bath does help OTC pain patches not helping  Current Outpatient Medications on File Prior to Visit  Medication Sig Dispense Refill   aspirin EC 81 MG tablet Take 1 tablet (81 mg total) by mouth daily. 90 tablet 3   latanoprost (XALATAN) 0.005 % ophthalmic solution Place 1 drop into the right eye at bedtime.     metoprolol tartrate (LOPRESSOR) 25 MG tablet TAKE 1/2 TABLET(12.5 MG) BY MOUTH TWICE DAILY 90 tablet 3   Multiple Vitamin (MULTIVITAMIN) tablet Take 1 tablet by mouth daily.     nitroGLYCERIN (NITROSTAT) 0.4 MG SL tablet Place 1 tablet (0.4 mg total) under the tongue every 5 (five) minutes as needed for chest pain. 25 tablet 2   pantoprazole (PROTONIX) 40 MG tablet TAKE 1 TABLET(40 MG) BY MOUTH DAILY 90 tablet 3   rosuvastatin (CRESTOR) 40 MG tablet Take 1 tablet (40 mg total) by mouth daily. 90 tablet 3   No current facility-administered medications on file prior to visit.    No Known Allergies  Past Medical History:  Diagnosis Date   BARRETTS ESOPHAGUS    Carotid artery occlusion    COLONIC POLYPS, HX OF    COPD    pt denies    CORONARY ARTERY DISEASE    a. 2007: cath showing 95% stenosis proximal LAD (BMS placed), 40% D1, and 75% RCA stenosis. b. NST 08/2008: no evidence of ischemia c. NST 04/2012: no evidence of ischemia.   GERD    GLAUCOMA    HYPERCHOLESTEROLEMIA    HYPERLIPIDEMIA    NSTEMI (non-ST elevated myocardial infarction) (HCC) 02/25/2016   Other constipation    OTITIS EXTERNA    Peripheral vascular disease (HCC)    pt denies    Thoracic aorta atherosclerosis (HCC)    Wears dentures    Full upper,  partial lower    Past Surgical History:  Procedure Laterality Date   CARDIAC CATHETERIZATION N/A 02/26/2016   Procedure: Left Heart Cath and Coronary Angiography;  Surgeon: Iran Ouch, MD;  Location: [facility];  Service: Cardiovascular;  Laterality: N/A;   CARDIAC CATHETERIZATION N/A 02/26/2016   Procedure: Coronary Stent Intervention;  Surgeon: Iran Ouch, MD;  Location: [facility];  Service: Cardiovascular;  Laterality: N/A;   CATARACT EXTRACTION W/PHACO Right 03/08/2022   Procedure: CATARACT EXTRACTION PHACO AND INTRAOCULAR LENS PLACEMENT (IOC) RIGHT 8.94 00:47.2;  Surgeon: Galen Manila, MD;  Location: [facility];  Service: Ophthalmology;  Laterality: Right;   CORONARY STENT PLACEMENT     ESOPHAGOGASTRODUODENOSCOPY     UMBILICAL HERNIA REPAIR     XI ROBOTIC ASSISTED INGUINAL HERNIA REPAIR WITH MESH Left 08/31/2020   Procedure: XI ROBOTIC ASSISTED LEFT INGUINAL HERNIA REPAIR TAPP;  Surgeon: Luretha Murphy, MD;  Location: [facility];  Service: General;  Laterality: Left;    Family History  Problem Relation Age of Onset   Heart disease Father 55       Before age 40- Open Heart surgery   Hyperlipidemia Mother     Social History   Socioeconomic History   Marital status: Married  Spouse name: Not on file   Number of children: Not on file   Years of education: Not on file   Highest education level: Not on file  Occupational History   Not on file  Tobacco Use   Smoking status: Every Day    Packs/day: 1.00    Years: 55.00    Total pack years: 55.00    Types: Cigarettes    Passive exposure: Never   Smokeless tobacco: Current    Types: Snuff   Tobacco comments:    Started smoking around age 35  Vaping Use   Vaping Use: Never used  Substance and Sexual Activity   Alcohol use: Yes    Alcohol/week: 4.0 standard drinks of alcohol    Types: 4 Cans of beer per week    Comment: occ   Drug use: No   Sexual activity: Not on file  Other  Topics Concern   Not on file  Social History Narrative   No living will    Would want wife, then son, would be health care POA   Would accept resuscitation attempts--but no prolonged artificial ventilation   No  tube feeds if cognitively unaware   Social Determinants of Health   Financial Resource Strain: Not on file  Food Insecurity: Not on file  Transportation Needs: Not on file  Physical Activity: Not on file  Stress: Not on file  Social Connections: Not on file  Intimate Partner Violence: Not on file   Review of Systems Stable cyst on back of neck No apparent swollen glands Not sick     Objective:   Physical Exam Constitutional:      Appearance: Normal appearance.  Neck:     Comments: Fair ROM ---but pain with rotation to left Tender at SCM insertion on mastoid---otherwise negative No nodes or masses Musculoskeletal:     Comments: ROM normal in left shoulder  Neurological:     Mental Status: He is alert.            Assessment & Plan:

## 2022-03-28 NOTE — Patient Instructions (Signed)
Try over the counter diclofenac gel three times a day on the painful spot. You can also try ibuprofen -- 2-3 tabs (400-600mg ) up to three times a day with food Heat may help (like the hot bath) You can use the prescription muscle relaxer (tizanidine) --- one or two at bedtime If you don't improve, we can consider a physical therapist (or chiropractor).

## 2022-04-04 DIAGNOSIS — H2512 Age-related nuclear cataract, left eye: Secondary | ICD-10-CM | POA: Diagnosis not present

## 2022-04-06 ENCOUNTER — Encounter: Payer: Self-pay | Admitting: Ophthalmology

## 2022-04-07 ENCOUNTER — Other Ambulatory Visit: Payer: Self-pay

## 2022-04-07 ENCOUNTER — Emergency Department
Admission: EM | Admit: 2022-04-07 | Discharge: 2022-04-07 | Disposition: A | Discharge status: Home / Self Care | Payer: PPO | Attending: Student in an Organized Health Care Education/Training Program | Admitting: Student in an Organized Health Care Education/Training Program

## 2022-04-07 ENCOUNTER — Emergency Department: Payer: PPO

## 2022-04-07 DIAGNOSIS — K219 Gastro-esophageal reflux disease without esophagitis: Secondary | ICD-10-CM | POA: Insufficient documentation

## 2022-04-07 DIAGNOSIS — R079 Chest pain, unspecified: Secondary | ICD-10-CM

## 2022-04-07 DIAGNOSIS — R0789 Other chest pain: Secondary | ICD-10-CM | POA: Diagnosis not present

## 2022-04-07 LAB — TROPONIN I (HIGH SENSITIVITY)
Troponin I (High Sensitivity): 6 ng/L (ref <18)
Troponin I (High Sensitivity): 6 ng/L (ref <18)

## 2022-04-07 LAB — BASIC METABOLIC PANEL
Anion gap: 3 — ABNORMAL LOW (ref 5–15)
BUN: 18 mg/dL (ref 8–23)
CO2: 29 mmol/L (ref 22–32)
Calcium: 9.3 mg/dL (ref 8.9–10.3)
Chloride: 109 mmol/L (ref 98–111)
Creatinine, Ser: 1.09 mg/dL (ref 0.61–1.24)
GFR, Estimated: >60 mL/min (ref >60)
Glucose, Bld: 119 mg/dL — ABNORMAL HIGH (ref 70–99)
Potassium: 4.9 mmol/L (ref 3.5–5.1)
Sodium: 141 mmol/L (ref 135–145)

## 2022-04-07 LAB — CBC
HCT: 41.2 % (ref 39.0–52.0)
Hemoglobin: 13.5 g/dL (ref 13.0–17.0)
MCH: 30.5 pg (ref 26.0–34.0)
MCHC: 32.8 g/dL (ref 30.0–36.0)
MCV: 93.2 fL (ref 80.0–100.0)
Platelets: 218 10*3/uL (ref 150–400)
RBC: 4.42 MIL/uL (ref 4.22–5.81)
RDW: 12.2 % (ref 11.5–15.5)
WBC: 9.6 10*3/uL (ref 4.0–10.5)
nRBC: 0 % (ref 0.0–0.2)

## 2022-04-07 MED ORDER — ALUM & MAG HYDROXIDE-SIMETH 200-200-20 MG/5ML PO SUSP
15.0000 mL | Freq: Once | ORAL | Status: AC
  Administered 2022-04-07: 15 mL via ORAL
  Filled 2022-04-07: qty 30

## 2022-04-07 MED ORDER — LIDOCAINE VISCOUS HCL 2 % MT SOLN: 30.0000 mL | Freq: Three times a day (TID) | OROMUCOSAL | 0 refills | Status: DC | PRN

## 2022-04-07 MED ORDER — LIDOCAINE VISCOUS HCL 2 % MT SOLN
15.0000 mL | Freq: Once | OROMUCOSAL | Status: AC
  Administered 2022-04-07: 15 mL via OROMUCOSAL
  Filled 2022-04-07: qty 15

## 2022-04-07 NOTE — ED Triage Notes (Signed)
Pt with chest pain since 0630 am, denies N/V denies SOB, pt has been taking medication for indigestion but it did not help, pt took nitroglycerin but it did not help pain. Pt states he has 4 stents. Pt denies radiation of pain.

## 2022-04-07 NOTE — Discharge Instructions (Signed)

## 2022-04-07 NOTE — ED Provider Notes (Signed)
[facility] Provider Note  Patient Contact: 3:27 PM (approximate)   History   Chest Pain   HPI  [name] [name] is a 71 y.o. male who presents the emergency department complaining of left-sided chest pain.  Patient states that he has a complex medical history with multiple stents.  Patient states that this morning he woke up, did not have any chest pain but has had some intermittent sensations in his lower chest.  He states that it feels different than his previous heart issues/heart attacks.  He states that it feels more like reflux.  He tried taking a dose of his prescribed reflux medication and some Tums and this helped after he had some significant belching.  Patient states that he still has some twinges and given his cardiac history he presents for evaluation.  No shortness of breath.  Patient had no emesis.  No diarrhea or constipation.  No fevers or chills, nasal congestion, sore throat, cough.     Physical Exam   Triage Vital Signs: ED Triage Vitals  Enc Vitals Group     BP 04/07/22 1429 137/76     Pulse Rate 04/07/22 1429 80     Resp 04/07/22 1429 18     Temp 04/07/22 1429 98 F (36.7 C)     Temp Source 04/07/22 1429 Oral     SpO2 04/07/22 1429 98 %     Weight 04/07/22 1429 165 lb (74.8 kg)     Height 04/07/22 1429 5\' 8"  (1.727 m)     Head Circumference --      Peak Flow --      Pain Score 04/07/22 1428 5     Pain Loc --      Pain Edu? --      Excl. in GC? --     Most recent vital signs: Vitals:   04/07/22 1429  BP: 137/76  Pulse: 80  Resp: 18  Temp: 98 F (36.7 C)  SpO2: 98%     General: Alert and in no acute distress.  Neck: No stridor.  Hematological/Lymphatic/Immunilogical: No cervical lymphadenopathy. Cardiovascular:  Good peripheral perfusion.  No appreciable murmurs, rubs, gallops. Respiratory: Normal respiratory effort without tachypnea or retractions. Lungs CTAB. Good air entry to the bases with no decreased or absent  breath sounds. Gastrointestinal: Bowel sounds 4 quadrants. Soft and nontender to palpation. No guarding or rigidity. No palpable masses. No distention. Musculoskeletal: Full range of motion to all extremities.  Neurologic:  No gross focal neurologic deficits are appreciated.  Skin:   No rash noted Other:   ED Results / Procedures / Treatments   Labs (all labs ordered are listed, but only abnormal results are displayed) Labs Reviewed  BASIC METABOLIC PANEL - Abnormal; Notable for the following components:      Result Value   Glucose, Bld 119 (*)    Anion gap 3 (*)    All other components within normal limits  CBC  TROPONIN I (HIGH SENSITIVITY)  TROPONIN I (HIGH SENSITIVITY)     EKG  ED ECG REPORT I, 04/09/22 Lesslie Mckeehan,  personally viewed and interpreted this ECG.   Date: 04/07/2022  EKG Time: 1428 hrs.  Rate: 82 bpm  Rhythm: unchanged from previous tracings, normal sinus rhythm, PAC's noted, incomplete right bundle branch block, nonspecific ST and T wave changes in the anterolateral leads  Axis: Normal axis  Intervals:none  ST&T Change: No ST elevation or depression noted  Normal sinus rhythm.  Occasional PAC.  Right bundle branch block.  No evidence of STEMI.  Largely unchanged from previous EKG from 03/25/2019.    RADIOLOGY  I personally viewed, evaluated, and interpreted these images as part of my medical decision making, as well as reviewing the written report by the radiologist.  ED Provider Interpretation: No acute cardiopulmonary findings on chest x-ray  DG Chest 2 View  Result Date: 04/07/2022 CLINICAL DATA:  Chest pain EXAM: CHEST - 2 VIEW COMPARISON:  06/14/2021 FINDINGS: Cardiomediastinal contours are within normal limits. Hyperinflated lungs with chronically coarsened interstitial markings bilaterally. No superimposed airspace consolidation. No pleural effusion or pneumothorax. IMPRESSION: Chronic lung changes without superimposed acute cardiopulmonary  process. Electronically Signed   By: Duanne Guess D.O.   On: 04/07/2022 14:59    PROCEDURES:  Critical Care performed: No  Procedures   MEDICATIONS ORDERED IN ED: Medications  alum & mag hydroxide-simeth (MAALOX/MYLANTA) 200-200-20 MG/5ML suspension 15 mL (15 mLs Oral Given 04/07/22 1618)  lidocaine (XYLOCAINE) 2 % viscous mouth solution 15 mL (15 mLs Mouth/Throat Given 04/07/22 1618)     IMPRESSION / MDM / ASSESSMENT AND PLAN / ED COURSE  I reviewed the triage vital signs and the nursing notes.                              Differential diagnosis includes, but is not limited to, STEMI, NSTEMI, GERD, costochondritis  Patient's presentation is most consistent with acute presentation with potential threat to life or bodily function.   Patient's diagnosis is consistent with nonspecific chest pain, GERD.  Patient presents the emergency department with intermittent left-sided chest pain.  This began this morning.  He has a strong cardiac history with multiple stents placed.  Patient states that the symptoms feel more like GERD than his previous cardiac issues.  He had taken some Tums which had significantly improved the symptoms and presented for evaluation.  Overall exam is reassuring.  Labs are reassuring with reassuring troponins, EKG at baseline.  X-ray is reassuring.  I offered a GI cocktail to see if this improves patient's symptoms and after GI cocktail patient states that pain is essentially gone.  I suspect GI source given the fact that improved twice after using GERD medications and the fact that his troponins and EKG are reassuring.  Given strict return precautions.  At this time however I do feel that patient is stable for discharge..  Patient is given ED precautions to return to the ED for any worsening or new symptoms.        FINAL CLINICAL IMPRESSION(S) / ED DIAGNOSES   Final diagnoses:  Nonspecific chest pain  Gastroesophageal reflux disease, unspecified whether  esophagitis present     Rx / DC Orders   ED Discharge Orders          Ordered    GI Cocktail (alum & mag hydroxide-simethicone/lidocaine)oral mixture  3 times daily PRN        04/07/22 1831             Note:  This document was prepared using Dragon voice recognition software and may include unintentional dictation errors.   Lanette Hampshire 04/07/22 1831    Willy Eddy, MD 04/07/22 1929

## 2022-04-12 ENCOUNTER — Encounter: Payer: Self-pay | Admitting: Ophthalmology

## 2022-04-12 ENCOUNTER — Ambulatory Visit: Payer: PPO | Admitting: Anesthesiology

## 2022-04-12 ENCOUNTER — Other Ambulatory Visit: Payer: Self-pay

## 2022-04-12 ENCOUNTER — Encounter
Admission: RE | Disposition: A | Discharge status: Home / Self Care | Payer: Self-pay | Source: Home / Self Care | Attending: Ophthalmology

## 2022-04-12 ENCOUNTER — Ambulatory Visit
Admission: RE | Admit: 2022-04-12 | Discharge: 2022-04-12 | Disposition: A | Discharge status: Home / Self Care | Payer: PPO | Attending: Ophthalmology | Admitting: Ophthalmology

## 2022-04-12 DIAGNOSIS — Z955 Presence of coronary angioplasty implant and graft: Secondary | ICD-10-CM | POA: Insufficient documentation

## 2022-04-12 DIAGNOSIS — I10 Essential (primary) hypertension: Secondary | ICD-10-CM | POA: Diagnosis not present

## 2022-04-12 DIAGNOSIS — J449 Chronic obstructive pulmonary disease, unspecified: Secondary | ICD-10-CM | POA: Insufficient documentation

## 2022-04-12 DIAGNOSIS — K219 Gastro-esophageal reflux disease without esophagitis: Secondary | ICD-10-CM | POA: Diagnosis not present

## 2022-04-12 DIAGNOSIS — Z8249 Family history of ischemic heart disease and other diseases of the circulatory system: Secondary | ICD-10-CM | POA: Diagnosis not present

## 2022-04-12 DIAGNOSIS — E7800 Pure hypercholesterolemia, unspecified: Secondary | ICD-10-CM | POA: Insufficient documentation

## 2022-04-12 DIAGNOSIS — I2510 Atherosclerotic heart disease of native coronary artery without angina pectoris: Secondary | ICD-10-CM | POA: Diagnosis not present

## 2022-04-12 DIAGNOSIS — Z8349 Family history of other endocrine, nutritional and metabolic diseases: Secondary | ICD-10-CM | POA: Insufficient documentation

## 2022-04-12 DIAGNOSIS — I739 Peripheral vascular disease, unspecified: Secondary | ICD-10-CM | POA: Insufficient documentation

## 2022-04-12 DIAGNOSIS — H2512 Age-related nuclear cataract, left eye: Secondary | ICD-10-CM | POA: Diagnosis not present

## 2022-04-12 DIAGNOSIS — I252 Old myocardial infarction: Secondary | ICD-10-CM | POA: Insufficient documentation

## 2022-04-12 DIAGNOSIS — F17210 Nicotine dependence, cigarettes, uncomplicated: Secondary | ICD-10-CM | POA: Insufficient documentation

## 2022-04-12 HISTORY — PX: CATARACT EXTRACTION W/PHACO: SHX586

## 2022-04-12 SURGERY — PHACOEMULSIFICATION, CATARACT, WITH IOL INSERTION
Anesthesia: Monitor Anesthesia Care | Site: Eye | Laterality: Left

## 2022-04-12 MED ORDER — SIGHTPATH DOSE#1 BSS IO SOLN
INTRAOCULAR | Status: DC | PRN
  Administered 2022-04-12: 15 mL

## 2022-04-12 MED ORDER — SIGHTPATH DOSE#1 BSS IO SOLN
INTRAOCULAR | Status: DC | PRN
  Administered 2022-04-12: 69 mL via OPHTHALMIC

## 2022-04-12 MED ORDER — TETRACAINE HCL 0.5 % OP SOLN
1.0000 [drp] | OPHTHALMIC | Status: DC | PRN
  Administered 2022-04-12 (×3): 1 [drp] via OPHTHALMIC

## 2022-04-12 MED ORDER — BRIMONIDINE TARTRATE-TIMOLOL 0.2-0.5 % OP SOLN
OPHTHALMIC | Status: DC | PRN
  Administered 2022-04-12: 1 [drp] via OPHTHALMIC

## 2022-04-12 MED ORDER — MOXIFLOXACIN HCL 0.5 % OP SOLN
OPHTHALMIC | Status: DC | PRN
  Administered 2022-04-12: .2 mL via OPHTHALMIC

## 2022-04-12 MED ORDER — ARMC OPHTHALMIC DILATING DROPS
1.0000 | OPHTHALMIC | Status: DC | PRN
  Administered 2022-04-12 (×3): 1 via OPHTHALMIC

## 2022-04-12 MED ORDER — FENTANYL CITRATE (PF) 100 MCG/2ML IJ SOLN
INTRAMUSCULAR | Status: DC | PRN
  Administered 2022-04-12: 100 ug via INTRAVENOUS

## 2022-04-12 MED ORDER — MIDAZOLAM HCL 2 MG/2ML IJ SOLN
INTRAMUSCULAR | Status: DC | PRN
  Administered 2022-04-12: 2 mg via INTRAVENOUS

## 2022-04-12 MED ORDER — SIGHTPATH DOSE#1 BSS IO SOLN
INTRAOCULAR | Status: DC | PRN
  Administered 2022-04-12: 1 mL via INTRAMUSCULAR

## 2022-04-12 MED ORDER — SIGHTPATH DOSE#1 NA CHONDROIT SULF-NA HYALURON 40-17 MG/ML IO SOLN
INTRAOCULAR | Status: DC | PRN
  Administered 2022-04-12: 1 mL via INTRAOCULAR

## 2022-04-12 SURGICAL SUPPLY — 9 items
CATARACT SUITE SIGHTPATH (MISCELLANEOUS) ×1 IMPLANT
FEE CATARACT SUITE SIGHTPATH (MISCELLANEOUS) ×2 IMPLANT
GLOVE SURG ENC TEXT LTX SZ8 (GLOVE) ×2 IMPLANT
GLOVE SURG TRIUMPH 8.0 PF LTX (GLOVE) ×2 IMPLANT
LENS IOL TECNIS EYHANCE 19.0 (Intraocular Lens) IMPLANT
NDL FILTER BLUNT 18X1 1/2 (NEEDLE) ×2 IMPLANT
NEEDLE FILTER BLUNT 18X1 1/2 (NEEDLE) ×1 IMPLANT
SYR 3ML LL SCALE MARK (SYRINGE) ×2 IMPLANT
WATER STERILE IRR 250ML POUR (IV SOLUTION) ×2 IMPLANT

## 2022-04-12 NOTE — Anesthesia Postprocedure Evaluation (Signed)
Anesthesia Post Note  Patient: [name] [name]  Procedure(s) Performed: CATARACT EXTRACTION PHACO AND INTRAOCULAR LENS PLACEMENT (IOC) LEFT  6.38  00:41.6 (Left: Eye)  Patient location during evaluation: PACU Anesthesia Type: MAC Level of consciousness: awake and alert Pain management: pain level controlled Vital Signs Assessment: post-procedure vital signs reviewed and stable Respiratory status: spontaneous breathing, nonlabored ventilation, respiratory function stable and patient connected to nasal cannula oxygen Cardiovascular status: stable and blood pressure returned to baseline Postop Assessment: no apparent nausea or vomiting Anesthetic complications: no   No notable events documented.   Last Vitals:  Vitals:   04/12/22 1030 04/12/22 1033  BP: 123/76 132/69  Pulse:  69  Resp:  17  Temp:    SpO2:  97%    Last Pain:  Vitals:   04/12/22 1033  TempSrc:   PainSc: 0-No pain                 Martha Clan

## 2022-04-12 NOTE — Op Note (Signed)
PREOPERATIVE DIAGNOSIS:  Nuclear sclerotic cataract of the left eye.   POSTOPERATIVE DIAGNOSIS:  Nuclear sclerotic cataract of the left eye.   OPERATIVE PROCEDURE:ORPROCALL@   SURGEON:  Galen Manila, MD.   ANESTHESIA:  Anesthesiologist: Lenard Simmer, MD CRNA: Domenic Moras, CRNA  1.      Managed anesthesia care. 2.     0.39ml of Shugarcaine was instilled following the paracentesis   COMPLICATIONS:  None.   TECHNIQUE:   Stop and chop   DESCRIPTION OF PROCEDURE:  The patient was examined and consented in the preoperative holding area where the aforementioned topical anesthesia was applied to the left eye and then brought back to the Operating Room where the left eye was prepped and draped in the usual sterile ophthalmic fashion and a lid speculum was placed. A paracentesis was created with the side port blade and the anterior chamber was filled with viscoelastic. A near clear corneal incision was performed with the steel keratome. A continuous curvilinear capsulorrhexis was performed with a cystotome followed by the capsulorrhexis forceps. Hydrodissection and hydrodelineation were carried out with BSS on a blunt cannula. The lens was removed in a stop and chop  technique and the remaining cortical material was removed with the irrigation-aspiration handpiece. The capsular bag was inflated with viscoelastic and the Technis [id number] lens was placed in the capsular bag without complication. The remaining viscoelastic was removed from the eye with the irrigation-aspiration handpiece. The wounds were hydrated. The anterior chamber was flushed with BSS and the eye was inflated to physiologic pressure. 0.61ml Vigamox was placed in the anterior chamber. The wounds were found to be water tight. The eye was dressed with Combigan. The patient was given protective glasses to wear throughout the day and a shield with which to sleep tonight. The patient was also given drops with which to begin a drop  regimen today and will follow-up with me in one day. Implant Name Type Inv. Item Serial No. Manufacturer Lot No. LRB No. Used Action  LENS IOL TECNIS EYHANCE 19.0 - [id number] Intraocular Lens LENS IOL TECNIS EYHANCE 19.0 [id number] [employer]  Left 1 Implanted    Procedure(s): CATARACT EXTRACTION PHACO AND INTRAOCULAR LENS PLACEMENT (IOC) LEFT  6.38  00:41.6 (Left)  Electronically signed: Galen Manila 04/12/2022 10:27 AM

## 2022-04-12 NOTE — Anesthesia Preprocedure Evaluation (Signed)
Anesthesia Evaluation  Patient identified by MRN, date of birth, ID band Patient awake    Reviewed: Allergy & Precautions, H&P , NPO status , Patient's Chart, lab work & pertinent test results, reviewed documented beta blocker date and time   History of Anesthesia Complications Negative for: history of anesthetic complications  Airway Mallampati: I  TM Distance: >3 FB Neck ROM: full    Dental  (+) Upper Dentures, Edentulous Upper, Partial Lower, Dental Advidsory Given   Pulmonary neg shortness of breath, COPD (mild), neg recent URI, Current Smoker   Pulmonary exam normal breath sounds clear to auscultation       Cardiovascular Exercise Tolerance: Good hypertension, (-) angina + CAD, + Past MI, + Cardiac Stents and + Peripheral Vascular Disease  Normal cardiovascular exam(-) dysrhythmias (-) Valvular Problems/Murmurs Rhythm:regular Rate:Normal     Neuro/Psych negative neurological ROS  negative psych ROS   GI/Hepatic Neg liver ROS,GERD  ,,  Endo/Other  negative endocrine ROS    Renal/GU negative Renal ROS  negative genitourinary   Musculoskeletal   Abdominal   Peds  Hematology negative hematology ROS (+)   Anesthesia Other Findings Past Medical History: No date: BARRETTS ESOPHAGUS No date: Carotid artery occlusion No date: COLONIC POLYPS, HX OF No date: COPD     Comment:  pt denies  No date: CORONARY ARTERY DISEASE     Comment:  a. 2007: cath showing 95% stenosis proximal LAD (BMS               placed), 40% D1, and 75% RCA stenosis. b. NST 08/2008: no              evidence of ischemia c. NST 04/2012: no evidence of               ischemia. No date: GERD No date: GLAUCOMA No date: HYPERCHOLESTEROLEMIA No date: HYPERLIPIDEMIA 02/25/2016: NSTEMI (non-ST elevated myocardial infarction) (HCC) No date: Other constipation No date: OTITIS EXTERNA No date: Peripheral vascular disease (HCC)     Comment:  pt denies   No date: Thoracic aorta atherosclerosis (HCC) No date: Wears dentures     Comment:  Full upper, partial lower   Reproductive/Obstetrics negative OB ROS                              Anesthesia Physical Anesthesia Plan  ASA: 3  Anesthesia Plan: General   Post-op Pain Management:    Induction: Intravenous  PONV Risk Score and Plan: 1 and Propofol infusion and TIVA  Airway Management Planned: Natural Airway and Nasal Cannula  Additional Equipment:   Intra-op Plan:   Post-operative Plan:   Informed Consent: I have reviewed the patients History and Physical, chart, labs and discussed the procedure including the risks, benefits and alternatives for the proposed anesthesia with the patient or authorized representative who has indicated his/her understanding and acceptance.     Dental Advisory Given  Plan Discussed with: Anesthesiologist, CRNA and Surgeon  Anesthesia Plan Comments:         Anesthesia Quick Evaluation

## 2022-04-12 NOTE — Transfer of Care (Signed)
Immediate Anesthesia Transfer of Care Note  Patient: [name] [name]  Procedure(s) Performed: CATARACT EXTRACTION PHACO AND INTRAOCULAR LENS PLACEMENT (IOC) LEFT  6.38  00:41.6 (Left: Eye)  Patient Location: PACU  Anesthesia Type: MAC  Level of Consciousness: awake, alert  and patient cooperative  Airway and Oxygen Therapy: Patient Spontanous Breathing and Patient connected to supplemental oxygen  Post-op Assessment: Post-op Vital signs reviewed, Patient's Cardiovascular Status Stable, Respiratory Function Stable, Patent Airway and No signs of Nausea or vomiting  Post-op Vital Signs: Reviewed and stable  Complications: No notable events documented.

## 2022-04-12 NOTE — H&P (Signed)
[facility]   Primary Care Physician:  Karie Schwalbe, MD Ophthalmologist: Dr. Druscilla Brownie  Pre-Procedure History & Physical: HPI:  [name] [name] is a 71 y.o. male here for cataract surgery.   Past Medical History:  Diagnosis Date   BARRETTS ESOPHAGUS    Carotid artery occlusion    COLONIC POLYPS, HX OF    COPD    pt denies    CORONARY ARTERY DISEASE    a. 2007: cath showing 95% stenosis proximal LAD (BMS placed), 40% D1, and 75% RCA stenosis. b. NST 08/2008: no evidence of ischemia c. NST 04/2012: no evidence of ischemia.   GERD    GLAUCOMA    HYPERCHOLESTEROLEMIA    HYPERLIPIDEMIA    NSTEMI (non-ST elevated myocardial infarction) (HCC) 02/25/2016   Other constipation    OTITIS EXTERNA    Peripheral vascular disease (HCC)    pt denies    Thoracic aorta atherosclerosis (HCC)    Wears dentures    Full upper, partial lower    Past Surgical History:  Procedure Laterality Date   CARDIAC CATHETERIZATION N/A 02/26/2016   Procedure: Left Heart Cath and Coronary Angiography;  Surgeon: Iran Ouch, MD;  Location: [facility] INVASIVE CV LAB;  Service: Cardiovascular;  Laterality: N/A;   CARDIAC CATHETERIZATION N/A 02/26/2016   Procedure: Coronary Stent Intervention;  Surgeon: Iran Ouch, MD;  Location: [facility] INVASIVE CV LAB;  Service: Cardiovascular;  Laterality: N/A;   CATARACT EXTRACTION W/PHACO Right 03/08/2022   Procedure: CATARACT EXTRACTION PHACO AND INTRAOCULAR LENS PLACEMENT (IOC) RIGHT 8.94 00:47.2;  Surgeon: Galen Manila, MD;  Location: [facility];  Service: Ophthalmology;  Laterality: Right;   CORONARY STENT PLACEMENT     ESOPHAGOGASTRODUODENOSCOPY     UMBILICAL HERNIA REPAIR     XI ROBOTIC ASSISTED INGUINAL HERNIA REPAIR WITH MESH Left 08/31/2020   Procedure: XI ROBOTIC ASSISTED LEFT INGUINAL HERNIA REPAIR TAPP;  Surgeon: Luretha Murphy, MD;  Location: [facility];  Service: General;  Laterality: Left;    Prior to Admission medications   Medication  Sig Start Date End Date Taking? Authorizing Provider  aspirin EC 81 MG tablet Take 1 tablet (81 mg total) by mouth daily. 02/24/16  Yes Strader, [place] M, PA-C  latanoprost (XALATAN) 0.005 % ophthalmic solution Place 1 drop into the right eye at bedtime. 05/28/20  Yes [provider]  metoprolol tartrate (LOPRESSOR) 25 MG tablet TAKE 1/2 TABLET(12.5 MG) BY MOUTH TWICE DAILY 11/15/21  Yes Lewayne Bunting, MD  Multiple Vitamin (MULTIVITAMIN) tablet Take 1 tablet by mouth daily.   Yes [provider]  pantoprazole (PROTONIX) 40 MG tablet TAKE 1 TABLET(40 MG) BY MOUTH DAILY 07/12/21  Yes Crenshaw, Madolyn Frieze, MD  rosuvastatin (CRESTOR) 40 MG tablet Take 1 tablet (40 mg total) by mouth daily. 08/24/21  Yes Lewayne Bunting, MD  tizanidine (ZANAFLEX) 2 MG capsule Take 1-2 capsules (2-4 mg total) by mouth at bedtime as needed for muscle spasms. 03/28/22  Yes Karie Schwalbe, MD  GI Cocktail (alum & mag hydroxide-simethicone/lidocaine)oral mixture Take 30 mLs by mouth 3 (three) times daily as needed (Heartburn). Patient not taking: Reported on 04/12/2022 04/07/22   Cuthriell, Delorise Royals, PA-C  nitroGLYCERIN (NITROSTAT) 0.4 MG SL tablet Place 1 tablet (0.4 mg total) under the tongue every 5 (five) minutes as needed for chest pain. 03/31/14   Robbie Lis M, PA-C    Allergies as of 04/06/2022   (No Known Allergies)    Family History  Problem Relation Age of Onset  Heart disease Father 53       Before age 33- Open Heart surgery   Hyperlipidemia Mother     Social History   Socioeconomic History   Marital status: Married    Spouse name: Not on file   Number of children: Not on file   Years of education: Not on file   Highest education level: Not on file  Occupational History   Not on file  Tobacco Use   Smoking status: Every Day    Packs/day: 1.00    Years: 55.00    Total pack years: 55.00    Types: Cigarettes    Passive exposure: Never   Smokeless tobacco: Current     Types: Snuff   Tobacco comments:    Started smoking around age 55  Vaping Use   Vaping Use: Never used  Substance and Sexual Activity   Alcohol use: Yes    Alcohol/week: 4.0 standard drinks of alcohol    Types: 4 Cans of beer per week    Comment: occ   Drug use: No   Sexual activity: Not on file  Other Topics Concern   Not on file  Social History Narrative   No living will    Would want wife, then son, would be health care POA   Would accept resuscitation attempts--but no prolonged artificial ventilation   No  tube feeds if cognitively unaware   Social Determinants of Health   Financial Resource Strain: Not on file  Food Insecurity: Not on file  Transportation Needs: Not on file  Physical Activity: Not on file  Stress: Not on file  Social Connections: Not on file  Intimate Partner Violence: Not on file    Review of Systems: See HPI, otherwise negative ROS  Physical Exam: BP 138/79   Pulse 80   Temp 98.7 F (37.1 C) (Oral)   Resp 20   Ht 5\' 11"  (1.803 m)   Wt 76 kg   SpO2 98%   BMI 23.38 kg/m  General:   Alert, cooperative in NAD Head:  Normocephalic and atraumatic. Respiratory:  Normal work of breathing. Cardiovascular:  RRR  Impression/Plan: [name] [name] is here for cataract surgery.  Risks, benefits, limitations, and alternatives regarding cataract surgery have been reviewed with the patient.  Questions have been answered.  All parties agreeable.   [name], MD  04/12/2022, 9:58 AM

## 2022-04-13 ENCOUNTER — Telehealth: Payer: Self-pay

## 2022-04-13 ENCOUNTER — Encounter: Payer: Self-pay | Admitting: Ophthalmology

## 2022-04-13 NOTE — Chronic Care Management (AMB) (Addendum)
    Chronic Care Management Pharmacy Assistant   Name: [name] [name]  MRN: [id number] DOB: [birth year]  Reason for Encounter: Hospital Follow Up non CCM   Medications: Outpatient Encounter Medications as of 04/13/2022  Medication Sig Note   aspirin EC 81 MG tablet Take 1 tablet (81 mg total) by mouth daily.    GI Cocktail (alum & mag hydroxide-simethicone/lidocaine)oral mixture Take 30 mLs by mouth 3 (three) times daily as needed (Heartburn). (Patient not taking: Reported on 04/12/2022)    latanoprost (XALATAN) 0.005 % ophthalmic solution Place 1 drop into the right eye at bedtime.    metoprolol tartrate (LOPRESSOR) 25 MG tablet TAKE 1/2 TABLET(12.5 MG) BY MOUTH TWICE DAILY    Multiple Vitamin (MULTIVITAMIN) tablet Take 1 tablet by mouth daily.    nitroGLYCERIN (NITROSTAT) 0.4 MG SL tablet Place 1 tablet (0.4 mg total) under the tongue every 5 (five) minutes as needed for chest pain. 05/13/2021: Patient needs a refill   pantoprazole (PROTONIX) 40 MG tablet TAKE 1 TABLET(40 MG) BY MOUTH DAILY    rosuvastatin (CRESTOR) 40 MG tablet Take 1 tablet (40 mg total) by mouth daily.    tizanidine (ZANAFLEX) 2 MG capsule Take 1-2 capsules (2-4 mg total) by mouth at bedtime as needed for muscle spasms.    No facility-administered encounter medications on file as of 04/13/2022.      Reviewed hospital notes for details of recent visit. Has patient been contacted by Transitions of Care team? No Has patient seen PCP/specialist for hospital follow up (summarize OV if yes): No  Admitted to the ED on 04/07/22. Discharge date was 04/07/22.  Discharged from [facility] ED .   Discharge diagnosis (Principal Problem): Non specific chest pain  Patient was discharged to Home  Brief summary of hospital course:  Patient's diagnosis is consistent with nonspecific chest pain, GERD.  Patient presents the emergency department with intermittent left-sided chest pain.  This began this morning.  He has a strong cardiac  history with multiple stents placed.  Patient states that the symptoms feel more like GERD than his previous cardiac issues.  He had taken some Tums which had significantly improved the symptoms and presented for evaluation.  Overall exam is reassuring.  Labs are reassuring with reassuring troponins, EKG at baseline.  X-ray is reassuring.  I offered a GI cocktail to see if this improves patient's symptoms and after GI cocktail patient states that pain is essentially gone.  I suspect GI source given the fact that improved twice after using GERD medications and the fact that his troponins and EKG are reassuring.  Given strict return precautions.  At this time however I do feel that patient is stable for discharge..   New?Medications Started at [facility] [place] Discharge:?? -Started  GI cocktail   Medications that remain the same after Hospital Discharge:??  -All other medications will remain the same.    Next CCM appt: none  Other upcoming appts: No appointments scheduled within the next 30 days.  Al Corpus, PharmD notified and will determine if action is needed.   Burt Knack, [facility] Health concierge  [phone]  Pharmacist addendum: Reviewed chart. No PharmD intervention warranted.  Al Corpus, PharmD, BCACP 04/22/22 12:06 PM

## 2022-04-25 ENCOUNTER — Other Ambulatory Visit: Payer: Self-pay | Admitting: Internal Medicine

## 2022-05-26 ENCOUNTER — Other Ambulatory Visit: Payer: Self-pay | Admitting: Internal Medicine

## 2022-06-25 ENCOUNTER — Other Ambulatory Visit: Payer: Self-pay | Admitting: Internal Medicine

## 2022-07-03 ENCOUNTER — Other Ambulatory Visit: Payer: Self-pay | Admitting: Cardiology

## 2022-08-04 NOTE — Progress Notes (Signed)
HPI: FU coronary disease. He underwent cardiac catheterization in 2007 and had a bare-metal stent to his LAD. Patient had repeat catheterization November 2017 and was found to have an 80% posterior lateral, significant RCA disease and ejection fraction 55-65%. Patient had 3 drug-eluting stents placed in the right coronary artery at that time. Abdominal ultrasound May 2018 showed no aneurysm.  Nuclear study January 2020 showed ejection fraction 62% with no ischemia or infarction.  Also with cerebrovascular disease followed by vascular surgery. Since last seen, he denies dyspnea, chest pain, palpitations or syncope.  Current Outpatient Medications  Medication Sig Dispense Refill   aspirin EC 81 MG tablet Take 1 tablet (81 mg total) by mouth daily. 90 tablet 3   latanoprost (XALATAN) 0.005 % ophthalmic solution Place 1 drop into the right eye at bedtime.     metoprolol tartrate (LOPRESSOR) 25 MG tablet TAKE 1/2 TABLET(12.5 MG) BY MOUTH TWICE DAILY 90 tablet 3   Multiple Vitamin (MULTIVITAMIN) tablet Take 1 tablet by mouth daily.     pantoprazole (PROTONIX) 40 MG tablet TAKE 1 TABLET(40 MG) BY MOUTH DAILY 90 tablet 0   rosuvastatin (CRESTOR) 40 MG tablet Take 1 tablet (40 mg total) by mouth daily. Pt. Will need to make an appointment in order to receive future refills 30 tablet 0   tiZANidine (ZANAFLEX) 2 MG tablet TAKE 1 TO 2 TABLETS(2 TO 4 MG) BY MOUTH AT BEDTIME AS NEEDED FOR MUSCLE SPASMS 60 tablet 1   GI Cocktail (alum & mag hydroxide-simethicone/lidocaine)oral mixture Take 30 mLs by mouth 3 (three) times daily as needed (Heartburn). (Patient not taking: Reported on 04/12/2022) 180 mL 0   nitroGLYCERIN (NITROSTAT) 0.4 MG SL tablet Place 1 tablet (0.4 mg total) under the tongue every 5 (five) minutes as needed for chest pain. (Patient not taking: Reported on 08/16/2022) 25 tablet 2   No current facility-administered medications for this visit.     Past Medical History:  Diagnosis Date    BARRETTS ESOPHAGUS    Carotid artery occlusion    COLONIC POLYPS, HX OF    COPD    pt denies    CORONARY ARTERY DISEASE    a. 2007: cath showing 95% stenosis proximal LAD (BMS placed), 40% D1, and 75% RCA stenosis. b. NST 08/2008: no evidence of ischemia c. NST 04/2012: no evidence of ischemia.   GERD    GLAUCOMA    HYPERCHOLESTEROLEMIA    HYPERLIPIDEMIA    NSTEMI (non-ST elevated myocardial infarction) 02/25/2016   Other constipation    OTITIS EXTERNA    Peripheral vascular disease    pt denies    Thoracic aorta atherosclerosis    Wears dentures    Full upper, partial lower    Past Surgical History:  Procedure Laterality Date   CARDIAC CATHETERIZATION N/A 02/26/2016   Procedure: Left Heart Cath and Coronary Angiography;  Surgeon: Iran OuchMuhammad A Arida, MD;  Location: [facility] INVASIVE CV LAB;  Service: Cardiovascular;  Laterality: N/A;   CARDIAC CATHETERIZATION N/A 02/26/2016   Procedure: Coronary Stent Intervention;  Surgeon: Iran OuchMuhammad A Arida, MD;  Location: [facility] INVASIVE CV LAB;  Service: Cardiovascular;  Laterality: N/A;   CATARACT EXTRACTION W/PHACO Right 03/08/2022   Procedure: CATARACT EXTRACTION PHACO AND INTRAOCULAR LENS PLACEMENT (IOC) RIGHT 8.94 00:47.2;  Surgeon: Galen ManilaPorfilio, William, MD;  [facility];  Service: Ophthalmology;  Laterality: Right;   CATARACT EXTRACTION W/PHACO Left 04/12/2022   Procedure: CATARACT EXTRACTION PHACO AND INTRAOCULAR LENS PLACEMENT (IOC) LEFT  6.38  00:41.6;  Surgeon: Galen Manila, MD;  Location: [facility];  Service: Ophthalmology;  Laterality: Left;   CORONARY STENT PLACEMENT     ESOPHAGOGASTRODUODENOSCOPY     UMBILICAL HERNIA REPAIR     XI ROBOTIC ASSISTED INGUINAL HERNIA REPAIR WITH MESH Left 08/31/2020   Procedure: XI ROBOTIC ASSISTED LEFT INGUINAL HERNIA REPAIR TAPP;  Surgeon: Luretha Murphy, MD;  Location: [facility];  Service: General;  Laterality: Left;    Social History   Socioeconomic History   Marital status:  Married    Spouse name: Not on file   Number of children: Not on file   Years of education: Not on file   Highest education level: Not on file  Occupational History   Not on file  Tobacco Use   Smoking status: Every Day    Packs/day: 1.00    Years: 55.00    Additional pack years: 0.00    Total pack years: 55.00    Types: Cigarettes    Passive exposure: Never   Smokeless tobacco: Current    Types: Snuff   Tobacco comments:    Started smoking around age 40  Vaping Use   Vaping Use: Never used  Substance and Sexual Activity   Alcohol use: Yes    Alcohol/week: 4.0 standard drinks of alcohol    Types: 4 Cans of beer per week    Comment: occ   Drug use: No   Sexual activity: Not on file  Other Topics Concern   Not on file  Social History Narrative   No living will    Would want wife, then son, would be health care POA   Would accept resuscitation attempts--but no prolonged artificial ventilation   No  tube feeds if cognitively unaware   Social Determinants of Health   Financial Resource Strain: Not on file  Food Insecurity: Not on file  Transportation Needs: Not on file  Physical Activity: Not on file  Stress: Not on file  Social Connections: Not on file  Intimate Partner Violence: Not on file    Family History  Problem Relation Age of Onset   Heart disease Father 80       Before age 62- Open Heart surgery   Hyperlipidemia Mother     ROS: Neck pain but no fevers or chills, productive cough, hemoptysis, dysphasia, odynophagia, melena, hematochezia, dysuria, hematuria, rash, seizure activity, orthopnea, PND, pedal edema, claudication. Remaining systems are negative.  Physical Exam: Well-developed well-nourished in no acute distress.  Skin is warm and dry.  HEENT is normal.  Neck is supple.  Bilateral bruits. Chest is clear to auscultation with normal expansion.  Cardiovascular exam is regular rate and rhythm.  Abdominal exam nontender or distended. No masses  palpated. Extremities show no edema. neuro grossly intact   A/P  1 coronary artery disease-patient denies chest pain.  Continue aspirin and statin.  2 tobacco abuse-patient counseled on discontinuing.  3 hypertension-blood pressure controlled.  Continue present medications.  4 hyperlipidemia-continue statin.  5 carotid artery disease-he has not followed up with vascular surgery.  I will arrange follow-up carotid Dopplers.  Olga Millers, MD

## 2022-08-12 ENCOUNTER — Other Ambulatory Visit: Payer: Self-pay | Admitting: Cardiology

## 2022-08-12 DIAGNOSIS — I2510 Atherosclerotic heart disease of native coronary artery without angina pectoris: Secondary | ICD-10-CM

## 2022-08-16 ENCOUNTER — Encounter: Payer: Self-pay | Admitting: Cardiology

## 2022-08-16 ENCOUNTER — Ambulatory Visit: Payer: PPO | Attending: Cardiology | Admitting: Cardiology

## 2022-08-16 VITALS — BP 126/78 | HR 74 | Ht 71.0 in | Wt 172.2 lb

## 2022-08-16 DIAGNOSIS — E785 Hyperlipidemia, unspecified: Secondary | ICD-10-CM | POA: Diagnosis not present

## 2022-08-16 DIAGNOSIS — I2510 Atherosclerotic heart disease of native coronary artery without angina pectoris: Secondary | ICD-10-CM

## 2022-08-16 DIAGNOSIS — Z720 Tobacco use: Secondary | ICD-10-CM | POA: Diagnosis not present

## 2022-08-16 DIAGNOSIS — I10 Essential (primary) hypertension: Secondary | ICD-10-CM | POA: Diagnosis not present

## 2022-08-16 DIAGNOSIS — I6523 Occlusion and stenosis of bilateral carotid arteries: Secondary | ICD-10-CM | POA: Diagnosis not present

## 2022-08-16 MED ORDER — ROSUVASTATIN CALCIUM 40 MG PO TABS: 40.0000 mg | ORAL_TABLET | Freq: Every day | ORAL | 3 refills | Status: DC

## 2022-08-16 NOTE — Patient Instructions (Signed)
    Testing/Procedures:  Your physician has requested that you have a carotid duplex. This test is an ultrasound of the carotid arteries in your neck. It looks at blood flow through these arteries that supply the brain with blood. Allow one hour for this exam. There are no restrictions or special instructions. [place] OFFICE   Follow-Up: At [facility], you and your health needs are our priority.  As part of our continuing mission to provide you with exceptional heart care, we have created designated Provider Care Teams.  These Care Teams include your primary Cardiologist (physician) and Advanced Practice Providers (APPs -  Physician Assistants and Nurse Practitioners) who all work together to provide you with the care you need, when you need it.  We recommend signing up for the patient portal called "MyChart".  Sign up information is provided on this After Visit Summary.  MyChart is used to connect with patients for Virtual Visits (Telemedicine).  Patients are able to view lab/test results, encounter notes, upcoming appointments, etc.  Non-urgent messages can be sent to your provider as well.   To learn more about what you can do with MyChart, go to [email or web address].    Your next appointment:   12 month(s)  Provider:   Olga Millers, MD

## 2022-08-23 ENCOUNTER — Other Ambulatory Visit: Payer: Self-pay | Admitting: Internal Medicine

## 2022-08-29 ENCOUNTER — Telehealth: Payer: Self-pay | Admitting: Internal Medicine

## 2022-08-29 NOTE — Telephone Encounter (Signed)
Called patient to relay the message, states he did not want to make an appointment at that time but he will keep an eye on his symptoms.

## 2022-08-29 NOTE — Telephone Encounter (Signed)
He needs an appointment

## 2022-08-29 NOTE — Telephone Encounter (Signed)
Pt called stated he has a few tick bites and wants to know if Rx Doxycycline be call in or do he need an appointment . Please advise [phone]

## 2022-08-29 NOTE — Telephone Encounter (Signed)
Thank you for reaching out to him

## 2022-09-10 ENCOUNTER — Other Ambulatory Visit: Payer: Self-pay | Admitting: Cardiology

## 2022-09-10 DIAGNOSIS — I2510 Atherosclerotic heart disease of native coronary artery without angina pectoris: Secondary | ICD-10-CM

## 2022-09-14 ENCOUNTER — Ambulatory Visit: Payer: PPO | Attending: Cardiology

## 2022-09-14 DIAGNOSIS — I6523 Occlusion and stenosis of bilateral carotid arteries: Secondary | ICD-10-CM

## 2022-09-15 ENCOUNTER — Encounter: Payer: Self-pay | Admitting: *Deleted

## 2022-10-03 ENCOUNTER — Other Ambulatory Visit: Payer: Self-pay | Admitting: Cardiology

## 2022-10-26 ENCOUNTER — Other Ambulatory Visit: Payer: Self-pay | Admitting: Internal Medicine

## 2022-11-03 DIAGNOSIS — Z961 Presence of intraocular lens: Secondary | ICD-10-CM | POA: Diagnosis not present

## 2022-11-03 DIAGNOSIS — H401113 Primary open-angle glaucoma, right eye, severe stage: Secondary | ICD-10-CM | POA: Diagnosis not present

## 2022-11-08 ENCOUNTER — Other Ambulatory Visit: Payer: Self-pay | Admitting: Cardiology

## 2022-11-25 ENCOUNTER — Other Ambulatory Visit: Payer: Self-pay | Admitting: Internal Medicine

## 2022-12-08 ENCOUNTER — Other Ambulatory Visit: Payer: Self-pay | Admitting: Cardiology

## 2022-12-08 DIAGNOSIS — I6523 Occlusion and stenosis of bilateral carotid arteries: Secondary | ICD-10-CM

## 2022-12-13 ENCOUNTER — Telehealth: Payer: Self-pay | Admitting: Cardiology

## 2022-12-13 NOTE — Telephone Encounter (Signed)
Spoke with wife per DPR. Patient does not need carotid test until may of 2025 per Dr, Ludwig Clarks note.

## 2022-12-13 NOTE — Telephone Encounter (Signed)
   The patient's wife called and mentioned that they missed a call from our office. I informed her that the call was to schedule the patient's carotid test. She stated that the patient already had the test done on Sep 14, 2022. She would like to ask Dr. Jens Som if the patient needs to undergo the test again this year

## 2022-12-26 ENCOUNTER — Other Ambulatory Visit: Payer: Self-pay | Admitting: Internal Medicine

## 2023-01-27 ENCOUNTER — Other Ambulatory Visit: Payer: Self-pay | Admitting: Internal Medicine

## 2023-01-27 DIAGNOSIS — Z1211 Encounter for screening for malignant neoplasm of colon: Secondary | ICD-10-CM

## 2023-01-27 DIAGNOSIS — Z1212 Encounter for screening for malignant neoplasm of rectum: Secondary | ICD-10-CM

## 2023-02-02 ENCOUNTER — Encounter: Payer: Self-pay | Admitting: Internal Medicine

## 2023-02-02 ENCOUNTER — Ambulatory Visit: Payer: PPO | Admitting: Internal Medicine

## 2023-02-02 VITALS — BP 112/70 | HR 65 | Temp 98.2°F | Ht 71.0 in | Wt 169.0 lb

## 2023-02-02 DIAGNOSIS — S161XXA Strain of muscle, fascia and tendon at neck level, initial encounter: Secondary | ICD-10-CM | POA: Diagnosis not present

## 2023-02-02 DIAGNOSIS — Z23 Encounter for immunization: Secondary | ICD-10-CM

## 2023-02-02 NOTE — Patient Instructions (Signed)
Please try topical diclofenac gel, heat Take ibuprofen 200mg  -- two to three with meals for a few days Use the tizanidine muscle relaxers also

## 2023-02-02 NOTE — Assessment & Plan Note (Addendum)
Seems to be muscular--no evidence of HNP Needs ibuprofen 400-600mg  tid Heat Topical diclofenac 4mg  of the tizanidine  Will refer to PT if not a lot better in the next week

## 2023-02-02 NOTE — Progress Notes (Signed)
Subjective:    Patient ID: [name] [name], male    DOB: [birth year], 72 y.o.   MRN: [id number]  HPI Here due to ongoing neck pain Had been doing better---then just had pain at night Tylenol, topical diclofenac (or something topical)  Now much worse for a couple of days Worst on right side--to shoulder Still doing body work--but no clear cut injury (though day before it worsened, did go out and cut wood with chain saw)  No arm weakness--but couldn't pick up stuff today  Hasn't used ibuprofen lately Did try ice--then heat in past 2 nights---not really helping  Current Outpatient Medications on File Prior to Visit  Medication Sig Dispense Refill   aspirin EC 81 MG tablet Take 1 tablet (81 mg total) by mouth daily. 90 tablet 3   latanoprost (XALATAN) 0.005 % ophthalmic solution Place 1 drop into the right eye at bedtime.     metoprolol tartrate (LOPRESSOR) 25 MG tablet TAKE 1/2 TABLET(12.5 MG) BY MOUTH TWICE DAILY 90 tablet 3   Multiple Vitamin (MULTIVITAMIN) tablet Take 1 tablet by mouth daily.     nitroGLYCERIN (NITROSTAT) 0.4 MG SL tablet Place 1 tablet (0.4 mg total) under the tongue every 5 (five) minutes as needed for chest pain. 25 tablet 2   pantoprazole (PROTONIX) 40 MG tablet TAKE 1 TABLET(40 MG) BY MOUTH DAILY 90 tablet 3   rosuvastatin (CRESTOR) 40 MG tablet TAKE 1 TABLET(40 MG) BY MOUTH DAILY. MAKE AN APPOINTMENT IN ORDER TO RECEIVE FUTURE REFILLS 90 tablet 3   tiZANidine (ZANAFLEX) 2 MG tablet TAKE 1 TO 2 TABLETS(2 TO 4 MG) BY MOUTH AT BEDTIME AS NEEDED FOR MUSCLE SPASMS 60 tablet 2   No current facility-administered medications on file prior to visit.    No Known Allergies  Past Medical History:  Diagnosis Date   BARRETTS ESOPHAGUS    Carotid artery occlusion    COLONIC POLYPS, HX OF    COPD    pt denies    CORONARY ARTERY DISEASE    a. 2007: cath showing 95% stenosis proximal LAD (BMS placed), 40% D1, and 75% RCA stenosis. b. NST 08/2008: no evidence of ischemia  c. NST 04/2012: no evidence of ischemia.   GERD    GLAUCOMA    HYPERCHOLESTEROLEMIA    HYPERLIPIDEMIA    NSTEMI (non-ST elevated myocardial infarction) (HCC) 02/25/2016   Other constipation    OTITIS EXTERNA    Peripheral vascular disease (HCC)    pt denies    Thoracic aorta atherosclerosis (HCC)    Wears dentures    Full upper, partial lower    Past Surgical History:  Procedure Laterality Date   CARDIAC CATHETERIZATION N/A 02/26/2016   Procedure: Left Heart Cath and Coronary Angiography;  Surgeon: Iran Ouch, MD;  Location: [facility];  Service: Cardiovascular;  Laterality: N/A;   CARDIAC CATHETERIZATION N/A 02/26/2016   Procedure: Coronary Stent Intervention;  Surgeon: Iran Ouch, MD;  Location: [facility];  Service: Cardiovascular;  Laterality: N/A;   CATARACT EXTRACTION W/PHACO Right 03/08/2022   Procedure: CATARACT EXTRACTION PHACO AND INTRAOCULAR LENS PLACEMENT (IOC) RIGHT 8.94 00:47.2;  Surgeon: Galen Manila, MD;  Location: [facility];  Service: Ophthalmology;  Laterality: Right;   CATARACT EXTRACTION W/PHACO Left 04/12/2022   Procedure: CATARACT EXTRACTION PHACO AND INTRAOCULAR LENS PLACEMENT (IOC) LEFT  6.38  00:41.6;  Surgeon: Galen Manila, MD;  Location: [facility];  Service: Ophthalmology;  Laterality: Left;   CORONARY STENT PLACEMENT  ESOPHAGOGASTRODUODENOSCOPY     UMBILICAL HERNIA REPAIR     XI ROBOTIC ASSISTED INGUINAL HERNIA REPAIR WITH MESH Left 08/31/2020   Procedure: XI ROBOTIC ASSISTED LEFT INGUINAL HERNIA REPAIR TAPP;  Surgeon: Luretha Murphy, MD;  Location: [facility];  Service: General;  Laterality: Left;    Family History  Problem Relation Age of Onset   Heart disease Father 84       Before age 18- Open Heart surgery   Hyperlipidemia Mother     Social History   Socioeconomic History   Marital status: Married    Spouse name: Not on file   Number of children: Not on file   Years of education:  Not on file   Highest education level: Not on file  Occupational History   Not on file  Tobacco Use   Smoking status: Every Day    Current packs/day: 1.00    Average packs/day: 1 pack/day for 55.0 years (55.0 ttl pk-yrs)    Types: Cigarettes    Passive exposure: Never   Smokeless tobacco: Current    Types: Snuff   Tobacco comments:    Started smoking around age 55  Vaping Use   Vaping status: Never Used  Substance and Sexual Activity   Alcohol use: Yes    Alcohol/week: 4.0 standard drinks of alcohol    Types: 4 Cans of beer per week    Comment: occ   Drug use: No   Sexual activity: Not on file  Other Topics Concern   Not on file  Social History Narrative   No living will    Would want wife, then son, would be health care POA   Would accept resuscitation attempts--but no prolonged artificial ventilation   No  tube feeds if cognitively unaware   Social Determinants of Health   Financial Resource Strain: Not on file  Food Insecurity: Not on file  Transportation Needs: Not on file  Physical Activity: Not on file  Stress: Not on file  Social Connections: Not on file  Intimate Partner Violence: Not on file   Review of Systems     Objective:   Physical Exam Constitutional:      Appearance: Normal appearance.  Neck:     Comments: Guards movement in neck---marked restriction in all spheres (rotation, flex/ext, tilt) Tightness mostly in right trap Musculoskeletal:     Comments: ROM in right shoulder pretty normal  Neurological:     Mental Status: He is alert.     Comments: Normal strength in arms            Assessment & Plan:

## 2023-05-01 DIAGNOSIS — H401113 Primary open-angle glaucoma, right eye, severe stage: Secondary | ICD-10-CM | POA: Diagnosis not present

## 2023-05-08 DIAGNOSIS — H43813 Vitreous degeneration, bilateral: Secondary | ICD-10-CM | POA: Diagnosis not present

## 2023-05-08 DIAGNOSIS — Z961 Presence of intraocular lens: Secondary | ICD-10-CM | POA: Diagnosis not present

## 2023-05-08 DIAGNOSIS — H26491 Other secondary cataract, right eye: Secondary | ICD-10-CM | POA: Diagnosis not present

## 2023-05-08 DIAGNOSIS — H401113 Primary open-angle glaucoma, right eye, severe stage: Secondary | ICD-10-CM | POA: Diagnosis not present

## 2023-05-26 ENCOUNTER — Other Ambulatory Visit: Payer: Self-pay | Admitting: Internal Medicine

## 2023-05-26 NOTE — Telephone Encounter (Signed)
Last filled 01-27-23 #60 Last OV 02-02-23 No Future OV [facility] [address]

## 2023-05-26 NOTE — Telephone Encounter (Signed)
Spoke to pt's wife per dpr. She will have him call to schedule AWV.

## 2023-05-26 NOTE — Telephone Encounter (Signed)
OVerdue for wellness visit---have him set this up in the next few months

## 2023-06-30 ENCOUNTER — Telehealth: Payer: Self-pay | Admitting: Cardiology

## 2023-06-30 MED ORDER — PANTOPRAZOLE SODIUM 40 MG PO TBEC: DELAYED_RELEASE_TABLET | ORAL | 3 refills | Status: DC

## 2023-06-30 NOTE — Telephone Encounter (Signed)
 Pt's medication was sent to pt's pharmacy as requested. Confirmation received.

## 2023-06-30 NOTE — Telephone Encounter (Signed)
*  STAT* If patient is at the pharmacy, call can be transferred to refill team.   1. Which medications need to be refilled? (please list name of each medication and dose if known) pantoprazole (PROTONIX) 40 MG tablet  2. Which pharmacy/location (including street and city if local pharmacy) is medication to be sent to? [facility] - [address] [place], [place], [place] [address]  3. Do they need a 30 day or 90 day supply?   90 day supply

## 2023-07-28 ENCOUNTER — Telehealth: Payer: Self-pay | Admitting: Cardiology

## 2023-07-28 MED ORDER — METOPROLOL TARTRATE 25 MG PO TABS: 12.5000 mg | ORAL_TABLET | Freq: Two times a day (BID) | ORAL | 0 refills | Status: DC

## 2023-07-28 NOTE — Telephone Encounter (Signed)
 Pt's medication was sent to pt's pharmacy as requested. Confirmation received.

## 2023-07-28 NOTE — Telephone Encounter (Signed)
*  STAT* If patient is at the pharmacy, call can be transferred to refill team.   1. Which medications need to be refilled? (please list name of each medication and dose if known) Metoprolol    2. Would you like to learn more about the convenience, safety, & potential cost savings by using the [facility]?      3. Are you open to using the [facility] ([facility].    4. Which pharmacy/location (including street and city if local pharmacy) is medication to be sent to? [facility] [address] [place]   5. Do they need a 30 day or 90 day supply?  90 days and refills

## 2023-08-01 ENCOUNTER — Encounter: Payer: PPO | Admitting: Internal Medicine

## 2023-08-01 ENCOUNTER — Ambulatory Visit: Payer: PPO

## 2023-08-11 ENCOUNTER — Other Ambulatory Visit: Payer: Self-pay | Admitting: *Deleted

## 2023-08-11 DIAGNOSIS — I6523 Occlusion and stenosis of bilateral carotid arteries: Secondary | ICD-10-CM

## 2023-09-13 NOTE — Progress Notes (Signed)
 HPI: FU coronary disease. He underwent cardiac catheterization in 2007 and had a bare-metal stent to his LAD. Patient had repeat catheterization November 2017 and was found to have an 80% posterior lateral, significant RCA disease and ejection fraction 55-65%. Patient had 3 drug-eluting stents placed in the right coronary artery at that time. Abdominal ultrasound May 2018 showed no aneurysm.  Nuclear study January 2020 showed ejection fraction 62% with no ischemia or infarction.  Also with cerebrovascular disease followed by vascular surgery.  Carotid Dopplers May 2025 showed 1 to 39% right and 40 to 59% left stenosis.  Since last seen,   Current Outpatient Medications  Medication Sig Dispense Refill   aspirin  EC 81 MG tablet Take 1 tablet (81 mg total) by mouth daily. 90 tablet 3   latanoprost  (XALATAN ) 0.005 % ophthalmic solution Place 1 drop into the right eye at bedtime.     metoprolol  tartrate (LOPRESSOR ) 25 MG tablet Take 0.5 tablets (12.5 mg total) by mouth 2 (two) times daily. 90 tablet 0   Multiple Vitamin (MULTIVITAMIN) tablet Take 1 tablet by mouth daily.     nitroGLYCERIN  (NITROSTAT ) 0.4 MG SL tablet Place 1 tablet (0.4 mg total) under the tongue every 5 (five) minutes as needed for chest pain. 25 tablet 2   pantoprazole  (PROTONIX ) 40 MG tablet TAKE 1 TABLET(40 MG) BY MOUTH DAILY 30 tablet 3   rosuvastatin  (CRESTOR ) 40 MG tablet TAKE 1 TABLET(40 MG) BY MOUTH DAILY. MAKE AN APPOINTMENT IN ORDER TO RECEIVE FUTURE REFILLS 90 tablet 3   tiZANidine  (ZANAFLEX ) 2 MG tablet TAKE 1 TO 2 TABLETS(2 TO 4 MG) BY MOUTH AT BEDTIME AS NEEDED FOR MUSCLE SPASMS 60 tablet 2   No current facility-administered medications for this visit.     Past Medical History:  Diagnosis Date   BARRETTS ESOPHAGUS    Carotid artery occlusion    COLONIC POLYPS, HX OF    COPD    pt denies    CORONARY ARTERY DISEASE    a. 2007: cath showing 95% stenosis proximal LAD (BMS placed), 40% D1, and 75% RCA stenosis.  b. NST 08/2008: no evidence of ischemia c. NST 04/2012: no evidence of ischemia.   GERD    GLAUCOMA    HYPERCHOLESTEROLEMIA    HYPERLIPIDEMIA    NSTEMI (non-ST elevated myocardial infarction) (HCC) 02/25/2016   Other constipation    OTITIS EXTERNA    Peripheral vascular disease (HCC)    pt denies    Thoracic aorta atherosclerosis (HCC)    Wears dentures    Full upper, partial lower    Past Surgical History:  Procedure Laterality Date   CARDIAC CATHETERIZATION N/A 02/26/2016   Procedure: Left Heart Cath and Coronary Angiography;  Surgeon: Wenona Hamilton, MD;  Location: [facility];  Service: Cardiovascular;  Laterality: N/A;   CARDIAC CATHETERIZATION N/A 02/26/2016   Procedure: Coronary Stent Intervention;  Surgeon: Wenona Hamilton, MD;  Location: [facility];  Service: Cardiovascular;  Laterality: N/A;   CATARACT EXTRACTION W/PHACO Right 03/08/2022   Procedure: CATARACT EXTRACTION PHACO AND INTRAOCULAR LENS PLACEMENT (IOC) RIGHT 8.94 00:47.2;  Surgeon: Clair Crews, MD;  Location: [facility];  Service: Ophthalmology;  Laterality: Right;   CATARACT EXTRACTION W/PHACO Left 04/12/2022   Procedure: CATARACT EXTRACTION PHACO AND INTRAOCULAR LENS PLACEMENT (IOC) LEFT  6.38  00:41.6;  Surgeon: Clair Crews, MD;  Location: [facility];  Service: Ophthalmology;  Laterality: Left;   CORONARY STENT PLACEMENT     ESOPHAGOGASTRODUODENOSCOPY  UMBILICAL HERNIA REPAIR     XI ROBOTIC ASSISTED INGUINAL HERNIA REPAIR WITH MESH Left 08/31/2020   Procedure: XI ROBOTIC ASSISTED LEFT INGUINAL HERNIA REPAIR TAPP;  Surgeon: Jacolyn Matar, MD;  Location: [facility];  Service: General;  Laterality: Left;    Social History   Socioeconomic History   Marital status: Married    Spouse name: Not on file   Number of children: Not on file   Years of education: Not on file   Highest education level: Not on file  Occupational History   Not on file  Tobacco Use    Smoking status: Every Day    Current packs/day: 1.00    Average packs/day: 1 pack/day for 55.0 years (55.0 ttl pk-yrs)    Types: Cigarettes    Passive exposure: Never   Smokeless tobacco: Current    Types: Snuff   Tobacco comments:    Started smoking around age 66  Vaping Use   Vaping status: Never Used  Substance and Sexual Activity   Alcohol use: Yes    Alcohol/week: 4.0 standard drinks of alcohol    Types: 4 Cans of beer per week    Comment: occ   Drug use: No   Sexual activity: Not on file  Other Topics Concern   Not on file  Social History Narrative   No living will    Would want wife, then son, would be health care POA   Would accept resuscitation attempts--but no prolonged artificial ventilation   No  tube feeds if cognitively unaware   Social Drivers of Corporate investment banker Strain: Not on file  Food Insecurity: Not on file  Transportation Needs: Not on file  Physical Activity: Not on file  Stress: Not on file  Social Connections: Not on file  Intimate Partner Violence: Not on file    Family History  Problem Relation Age of Onset   Heart disease Father 84       Before age 62- Open Heart surgery   Hyperlipidemia Mother     ROS: no fevers or chills, productive cough, hemoptysis, dysphasia, odynophagia, melena, hematochezia, dysuria, hematuria, rash, seizure activity, orthopnea, PND, pedal edema, claudication. Remaining systems are negative.  Physical Exam: Well-developed well-nourished in no acute distress.  Skin is warm and dry.  HEENT is normal.  Neck is supple.  Chest is clear to auscultation with normal expansion.  Cardiovascular exam is regular rate and rhythm.  Abdominal exam nontender or distended. No masses palpated. Extremities show no edema. neuro grossly intact  EKG Interpretation Date/Time:  Tuesday September 26 2023 13:06:11 EDT Ventricular Rate:  69 PR Interval:  136 QRS Duration:  86 QT Interval:  406 QTC Calculation: 435 R  Axis:   6  Text Interpretation: Normal sinus rhythm Normal ECG Confirmed by Alexandria Angel ([id number]) on 09/26/2023 1:08:43 PM    A/P  1 coronary artery disease-patient doing well from a symptomatic standpoint with no chest pain.  Continue aspirin  and statin.  2 carotid artery disease-patient will need follow-up carotid Dopplers May 2026.  3 hypertension-blood pressure elevated; I have asked him to follow this at home and we will adjust medications if needed.  4 hyperlipidemia-continue statin.  Check lipids and liver.  5 tobacco abuse-patient again counseled on discontinuing.  Alexandria Angel, MD

## 2023-09-14 ENCOUNTER — Other Ambulatory Visit: Payer: Self-pay | Admitting: *Deleted

## 2023-09-14 ENCOUNTER — Ambulatory Visit: Payer: Self-pay | Admitting: Cardiology

## 2023-09-14 ENCOUNTER — Ambulatory Visit (HOSPITAL_COMMUNITY)
Admission: RE | Admit: 2023-09-14 | Discharge: 2023-09-14 | Disposition: A | Discharge status: Home / Self Care | Source: Ambulatory Visit | Attending: Cardiology | Admitting: Cardiology

## 2023-09-14 DIAGNOSIS — I6523 Occlusion and stenosis of bilateral carotid arteries: Secondary | ICD-10-CM | POA: Diagnosis not present

## 2023-09-15 DIAGNOSIS — M47812 Spondylosis without myelopathy or radiculopathy, cervical region: Secondary | ICD-10-CM | POA: Diagnosis not present

## 2023-09-26 ENCOUNTER — Ambulatory Visit: Payer: PPO | Attending: Cardiology | Admitting: Cardiology

## 2023-09-26 ENCOUNTER — Encounter: Payer: Self-pay | Admitting: Cardiology

## 2023-09-26 VITALS — BP 145/77 | HR 70 | Ht 71.0 in | Wt 160.0 lb

## 2023-09-26 DIAGNOSIS — E785 Hyperlipidemia, unspecified: Secondary | ICD-10-CM | POA: Diagnosis not present

## 2023-09-26 DIAGNOSIS — I2510 Atherosclerotic heart disease of native coronary artery without angina pectoris: Secondary | ICD-10-CM

## 2023-09-26 NOTE — Patient Instructions (Signed)

## 2023-09-27 ENCOUNTER — Ambulatory Visit: Payer: Self-pay | Admitting: Cardiology

## 2023-09-27 LAB — HEPATIC FUNCTION PANEL
ALT: 16 IU/L (ref 0–44)
AST: 24 IU/L (ref 0–40)
Albumin: 4.4 g/dL (ref 3.8–4.8)
Alkaline Phosphatase: 94 IU/L (ref 44–121)
Bilirubin Total: 0.3 mg/dL (ref 0.0–1.2)
Bilirubin, Direct: 0.13 mg/dL (ref 0.00–0.40)
Total Protein: 6.8 g/dL (ref 6.0–8.5)

## 2023-09-27 LAB — LIPID PANEL
Chol/HDL Ratio: 3.3 ratio (ref 0.0–5.0)
Cholesterol, Total: 134 mg/dL (ref 100–199)
HDL: 41 mg/dL (ref >39)
LDL Chol Calc (NIH): 64 mg/dL (ref 0–99)
Triglycerides: 171 mg/dL — ABNORMAL HIGH (ref 0–149)
VLDL Cholesterol Cal: 29 mg/dL (ref 5–40)

## 2023-10-03 ENCOUNTER — Other Ambulatory Visit: Payer: Self-pay | Admitting: Cardiology

## 2023-10-03 DIAGNOSIS — I2510 Atherosclerotic heart disease of native coronary artery without angina pectoris: Secondary | ICD-10-CM

## 2023-10-11 ENCOUNTER — Encounter: Payer: Self-pay | Admitting: Internal Medicine

## 2023-10-11 ENCOUNTER — Ambulatory Visit (INDEPENDENT_AMBULATORY_CARE_PROVIDER_SITE_OTHER): Admitting: Internal Medicine

## 2023-10-11 VITALS — BP 112/70 | HR 64 | Temp 97.9°F | Ht 68.5 in | Wt 166.0 lb

## 2023-10-11 DIAGNOSIS — K2100 Gastro-esophageal reflux disease with esophagitis, without bleeding: Secondary | ICD-10-CM | POA: Diagnosis not present

## 2023-10-11 DIAGNOSIS — M47812 Spondylosis without myelopathy or radiculopathy, cervical region: Secondary | ICD-10-CM

## 2023-10-11 DIAGNOSIS — Z9861 Coronary angioplasty status: Secondary | ICD-10-CM | POA: Diagnosis not present

## 2023-10-11 DIAGNOSIS — Z0000 Encounter for general adult medical examination without abnormal findings: Secondary | ICD-10-CM | POA: Diagnosis not present

## 2023-10-11 DIAGNOSIS — J449 Chronic obstructive pulmonary disease, unspecified: Secondary | ICD-10-CM | POA: Diagnosis not present

## 2023-10-11 DIAGNOSIS — I779 Disorder of arteries and arterioles, unspecified: Secondary | ICD-10-CM

## 2023-10-11 DIAGNOSIS — I2510 Atherosclerotic heart disease of native coronary artery without angina pectoris: Secondary | ICD-10-CM | POA: Diagnosis not present

## 2023-10-11 DIAGNOSIS — R079 Chest pain, unspecified: Secondary | ICD-10-CM

## 2023-10-11 LAB — RENAL FUNCTION PANEL
Albumin: 4.2 g/dL (ref 3.5–5.2)
BUN: 18 mg/dL (ref 6–23)
CO2: 31 meq/L (ref 19–32)
Calcium: 9.1 mg/dL (ref 8.4–10.5)
Chloride: 105 meq/L (ref 96–112)
Creatinine, Ser: 1.05 mg/dL (ref 0.40–1.50)
GFR: 71.06 mL/min (ref >60.00)
Glucose, Bld: 92 mg/dL (ref 70–99)
Phosphorus: 3.5 mg/dL (ref 2.3–4.6)
Potassium: 4.4 meq/L (ref 3.5–5.1)
Sodium: 141 meq/L (ref 135–145)

## 2023-10-11 LAB — CBC
HCT: 40.4 % (ref 39.0–52.0)
Hemoglobin: 13.5 g/dL (ref 13.0–17.0)
MCHC: 33.4 g/dL (ref 30.0–36.0)
MCV: 90.9 fl (ref 78.0–100.0)
Platelets: 243 10*3/uL (ref 150.0–400.0)
RBC: 4.44 Mil/uL (ref 4.22–5.81)
RDW: 13.4 % (ref 11.5–15.5)
WBC: 7.4 10*3/uL (ref 4.0–10.5)

## 2023-10-11 LAB — TSH: TSH: 1.46 u[IU]/mL (ref 0.35–5.50)

## 2023-10-11 MED ORDER — TIZANIDINE HCL 2 MG PO TABS: 2.0000 mg | ORAL_TABLET | Freq: Every evening | ORAL | 2 refills | Status: DC | PRN

## 2023-10-11 NOTE — Assessment & Plan Note (Signed)
 Mild symptoms--no Rx needed Discussed lung cancer screening--he wants to defer

## 2023-10-11 NOTE — Progress Notes (Signed)
 Hearing Screening - Comments:: Passed whisper test Vision Screening - Comments:: April 2025

## 2023-10-11 NOTE — Progress Notes (Signed)
 Subjective:    Patient ID: [name] [name], male    DOB: 1950-08-17, 73 y.o.   MRN: [id number]  HPI Here for Medicare wellness visit and follow up of chronic health conditions Reviewed advanced directives Reviewed other doctors---Dr Dr Crenshaw--cardiology, Dr Trudee Furth, Mr [facility] No hospitalizations or surgery in the past year Still smokes 1 beer most days Vision is okay--on glaucoma Rx Hearing is fair Physically active with work--body work No falls No depression or anhedonia Independent with instrumental ADLs No sig memory issues  Went to his orthopedist--for neck pain Given methocarbamol  and naproxen  and it has helped This has helped  Still smoking--just not ready to stop Some AM congestion and cough Breathing is okay  No chest pain or SOB No dizziness or syncope No stroke symptoms No palpitations No edema  Rare sudden severe groin pain--like I got kicked Gone in a minute No swelling or redness  Takes the pantoprazole  daily Not much heartburn with this No dysphagia  Current Outpatient Medications on File Prior to Visit  Medication Sig Dispense Refill   aspirin  EC 81 MG tablet Take 1 tablet (81 mg total) by mouth daily. 90 tablet 3   latanoprost  (XALATAN ) 0.005 % ophthalmic solution Place 1 drop into the right eye at bedtime.     metoprolol  tartrate (LOPRESSOR ) 25 MG tablet Take 0.5 tablets (12.5 mg total) by mouth 2 (two) times daily. 90 tablet 0   Multiple Vitamin (MULTIVITAMIN) tablet Take 1 tablet by mouth daily.     nitroGLYCERIN  (NITROSTAT ) 0.4 MG SL tablet Place 1 tablet (0.4 mg total) under the tongue every 5 (five) minutes as needed for chest pain. 25 tablet 2   pantoprazole  (PROTONIX ) 40 MG tablet TAKE ONE TABLET BY MOUTH ONCE DAILY 90 tablet 3   rosuvastatin  (CRESTOR ) 40 MG tablet TAKE 1 TABLET BY MOUTH DAILY 90 tablet 3   tiZANidine  (ZANAFLEX ) 2 MG tablet TAKE 1 TO 2 TABLETS(2 TO 4 MG) BY MOUTH AT BEDTIME AS NEEDED FOR MUSCLE SPASMS  60 tablet 2   No current facility-administered medications on file prior to visit.    No Known Allergies  Past Medical History:  Diagnosis Date   BARRETTS ESOPHAGUS    Carotid artery occlusion    COLONIC POLYPS, HX OF    COPD    pt denies    CORONARY ARTERY DISEASE    a. 2007: cath showing 95% stenosis proximal LAD (BMS placed), 40% D1, and 75% RCA stenosis. b. NST 08/2008: no evidence of ischemia c. NST 04/2012: no evidence of ischemia.   GERD    GLAUCOMA    HYPERCHOLESTEROLEMIA    HYPERLIPIDEMIA    NSTEMI (non-ST elevated myocardial infarction) (HCC) 02/25/2016   Other constipation    OTITIS EXTERNA    Peripheral vascular disease (HCC)    pt denies    Thoracic aorta atherosclerosis (HCC)    Wears dentures    Full upper, partial lower    Past Surgical History:  Procedure Laterality Date   CARDIAC CATHETERIZATION N/A 02/26/2016   Procedure: Left Heart Cath and Coronary Angiography;  Surgeon: Wenona Hamilton, MD;  Location: [facility];  Service: Cardiovascular;  Laterality: N/A;   CARDIAC CATHETERIZATION N/A 02/26/2016   Procedure: Coronary Stent Intervention;  Surgeon: Wenona Hamilton, MD;  Location: [facility];  Service: Cardiovascular;  Laterality: N/A;   CATARACT EXTRACTION W/PHACO Right 03/08/2022   Procedure: CATARACT EXTRACTION PHACO AND INTRAOCULAR LENS PLACEMENT (IOC) RIGHT 8.94 00:47.2;  Surgeon: Clair Crews, MD;  Location: [facility];  Service: Ophthalmology;  Laterality: Right;   CATARACT EXTRACTION W/PHACO Left 04/12/2022   Procedure: CATARACT EXTRACTION PHACO AND INTRAOCULAR LENS PLACEMENT (IOC) LEFT  6.38  00:41.6;  Surgeon: Clair Crews, MD;  Location: [facility];  Service: Ophthalmology;  Laterality: Left;   CORONARY STENT PLACEMENT     ESOPHAGOGASTRODUODENOSCOPY     UMBILICAL HERNIA REPAIR     XI ROBOTIC ASSISTED INGUINAL HERNIA REPAIR WITH MESH Left 08/31/2020   Procedure: XI ROBOTIC ASSISTED LEFT INGUINAL  HERNIA REPAIR TAPP;  Surgeon: Jacolyn Matar, MD;  Location: [facility];  Service: General;  Laterality: Left;    Family History  Problem Relation Age of Onset   Heart disease Father 32       Before age 64- Open Heart surgery   Hyperlipidemia Mother     Social History   Socioeconomic History   Marital status: Married    Spouse name: Not on file   Number of children: Not on file   Years of education: Not on file   Highest education level: Not on file  Occupational History   Not on file  Tobacco Use   Smoking status: Every Day    Current packs/day: 1.00    Average packs/day: 1 pack/day for 55.0 years (55.0 ttl pk-yrs)    Types: Cigarettes    Passive exposure: Never   Smokeless tobacco: Current    Types: Snuff   Tobacco comments:    Started smoking around age 44  Vaping Use   Vaping status: Never Used  Substance and Sexual Activity   Alcohol use: Yes    Alcohol/week: 4.0 standard drinks of alcohol    Types: 4 Cans of beer per week    Comment: occ   Drug use: No   Sexual activity: Not on file  Other Topics Concern   Not on file  Social History Narrative   No living will    Would want wife, then son, would be health care POA   Would accept resuscitation attempts--but no prolonged artificial ventilation   No  tube feeds if cognitively unaware   Social Drivers of Health   Financial Resource Strain: Not on file  Food Insecurity: Not on file  Transportation Needs: Not on file  Physical Activity: Not on file  Stress: Not on file  Social Connections: Not on file  Intimate Partner Violence: Not on file   Review of Systems Appetite is fine Weight stable Sleeps great Wears seat belt Full dentures No suspicious skin lesions--no derm Bowels are okay-- occasionally slow (will take something wife gives him) Voids okay---stream is fair No other joint/arthritis problems    Objective:   Physical Exam Constitutional:      Appearance: Normal appearance.  HENT:      Mouth/Throat:     Pharynx: No oropharyngeal exudate or posterior oropharyngeal erythema.   Eyes:     Conjunctiva/sclera: Conjunctivae normal.     Pupils: Pupils are equal, round, and reactive to light.    Cardiovascular:     Rate and Rhythm: Normal rate and regular rhythm.     Heart sounds: No murmur heard.    No gallop.     Comments: Faint pulse on right, normal on left Pulmonary:     Effort: Pulmonary effort is normal.     Breath sounds: No wheezing or rales.     Comments: Decreased breath sounds but clear Abdominal:     Palpations: Abdomen is soft.     Tenderness: There is  no abdominal tenderness.   Musculoskeletal:     Cervical back: Neck supple.     Right lower leg: No edema.     Left lower leg: No edema.  Lymphadenopathy:     Cervical: No cervical adenopathy.   Skin:    Findings: No lesion or rash.   Neurological:     General: No focal deficit present.     Mental Status: He is alert and oriented to person, place, and time.     Comments: Mini-cog---okay (Clock normal--recall 2/3)  Psychiatric:        Mood and Affect: Mood normal.        Behavior: Behavior normal.            Assessment & Plan:

## 2023-10-11 NOTE — Assessment & Plan Note (Signed)
 Mostly quiet on pantoprazole 

## 2023-10-11 NOTE — Assessment & Plan Note (Signed)
 Asked him to use the naproxen  prn only Refill tizanidine --safer than methocarbamol 

## 2023-10-11 NOTE — Assessment & Plan Note (Signed)
 No angina--hasn't needed nitroglycerin  Metoprolol  12.5 bid, rosuvastin 40, ASA 81

## 2023-10-11 NOTE — Assessment & Plan Note (Signed)
 I have personally reviewed the Medicare Annual Wellness questionnaire and have noted 1. The patient's medical and social history 2. Their use of alcohol, tobacco or illicit drugs 3. Their current medications and supplements 4. The patient's functional ability including ADL's, fall risks, home safety risks and hearing or visual             impairment. 5. Diet and physical activities 6. Evidence for depression or mood disorders  The patients weight, height, BMI and visual acuity have been recorded in the chart I have made referrals, counseling and provided education to the patient based review of the above and I have provided the pt with a written personalized care plan for preventive services.  I have provided you with a copy of your personalized plan for preventive services. Please take the time to review along with your updated medication list.  Stays active doing auto body work Just not willing to stop smoking Has cologuard at home--urged him to do it No PSA due to age Flu vaccine in fall Prefers no COVID vaccine Thinks he may have had shingrix Overdue for Td ---needs to go to pharmacy

## 2023-10-11 NOTE — Assessment & Plan Note (Signed)
 Gets yearly ultrasounds

## 2023-10-12 ENCOUNTER — Ambulatory Visit: Payer: Self-pay | Admitting: Internal Medicine

## 2023-10-24 ENCOUNTER — Other Ambulatory Visit: Payer: Self-pay | Admitting: Cardiology

## 2023-11-30 ENCOUNTER — Encounter: Payer: Self-pay | Admitting: Internal Medicine

## 2023-12-22 ENCOUNTER — Ambulatory Visit: Payer: Self-pay

## 2023-12-22 ENCOUNTER — Ambulatory Visit (INDEPENDENT_AMBULATORY_CARE_PROVIDER_SITE_OTHER): Admitting: Family Medicine

## 2023-12-22 ENCOUNTER — Encounter: Payer: Self-pay | Admitting: Family Medicine

## 2023-12-22 VITALS — BP 126/65 | HR 72 | Resp 16 | Wt 167.9 lb

## 2023-12-22 DIAGNOSIS — T63461A Toxic effect of venom of wasps, accidental (unintentional), initial encounter: Secondary | ICD-10-CM

## 2023-12-22 MED ORDER — PREDNISONE 20 MG PO TABS: ORAL_TABLET | ORAL | 0 refills | Status: DC

## 2023-12-22 NOTE — Telephone Encounter (Signed)
 Glad he has an appt this afternoon.

## 2023-12-22 NOTE — Telephone Encounter (Signed)
 FYI Only or Action Required?: FYI only for provider.  Patient was last seen in primary care on 10/11/2023 by [name] Charlie FERNS, MD.  Called Nurse Triage reporting Insect Bite.  Symptoms began yesterday.  Interventions attempted: OTC medications: benadryl.  Symptoms are: gradually worsening.  Triage Disposition: See Physician Within 24 Hours  Patient/caregiver understands and will follow disposition?: Yes  Copied from CRM [id number]. Topic: Clinical - Red Word Triage >> Dec 22, 2023 11:43 AM [name] [name] wrote: Red Word that prompted transfer to Nurse Triage: Patient spouse called said patient was bit by a yellow jacket and it's swollen and warm to touch. Reason for Disposition  Swelling is huge (e.g., more than 4 inches or 10 cm, spreads beyond wrist or ankle)  Answer Assessment - Initial Assessment Questions 1. TYPE: What type of sting was it? (e.g., bee, yellow jacket, unknown)      Yellow jacket 2. ONSET: When did it occur?      One day ago 3. LOCATION: Where is the sting located?  How many stings?     Left hand, stung twice 4. SWELLING SIZE: How big is the swelling? (e.g., inches or cm)     Firm swelling Extending up arm toward elbow 5. REDNESS: Is the area red or pink? If Yes, ask: What size is area of redness? (e.g., inches or cm). When did the redness start?     Mild redness  6. PAIN: Is there any pain? If Yes, ask: How bad is it?  (Scale 0-10; or none, mild, moderate, severe)     Denies aggravating 7. ITCHING: Is there any itching? If Yes, ask: How bad is it?      denies 8. RESPIRATORY DISTRESS: Describe your breathing.     denies 9. PRIOR REACTIONS: Have you had any severe allergic reactions to stings in the past? If Yes, ask: What happened?     Greater reaction than previous stings 10. OTHER SYMPTOMS: Do you have any other symptoms? (e.g., abdomen pain, face or tongue swelling, new rash elsewhere, vomiting)       denies 11. PREGNANCY: Is  there any chance you are pregnant? When was your last menstrual period?       N/A  Protocols used: Bee or Yellow Jacket Sting-A-AH

## 2023-12-22 NOTE — Progress Notes (Signed)
 Established patient visit   Patient: [name] [name]   DOB: 1950-10-31   73 y.o. Male  MRN: [id number] Visit Date: 12/22/2023  Today's healthcare provider: LAURAINE LOISE BUOY, DO   Chief Complaint  Patient presents with   Insect Bite    patient was bit by a yellow jacket yesterday about 1 pm and it's swollen and warm to touch. Patient has been taking Benadryl, last dose was last night at 10 pm   Subjective    HPI [name] [name] is a 73 year old male who presents with a yellow jacket sting on his hand.  He was stung by a yellow jacket on his hand while pulling weeds. Initially, he thought he had pricked himself with a weed until he saw the yellow jacket. The sting site was hot and swollen, described as 'hot as a grasshopper yesterday'. He has been stung by yellow jackets multiple times in the past, including an incident last year where he was stung about forty times, but this is the first time the reaction has lasted overnight.  He has been applying ice to the sting site, which has helped reduce the swelling. He notes that he can now squeeze his fingers again. The sting site started itching yesterday, which was alleviated with Benadryl. He continues to use cold compresses, such as a bag of frozen collard greens, to manage the swelling.  No trouble breathing or difficulty moving his hand. He has no known allergies. He does not have diabetes. 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       Medications: Outpatient Medications Prior to Visit  Medication Sig   aspirin  EC 81 MG tablet Take 1 tablet (81 mg total) by mouth daily.   latanoprost  (XALATAN ) 0.005 % ophthalmic solution Place 1 drop into the right eye at bedtime.   metoprolol  tartrate (LOPRESSOR ) 25 MG tablet TAKE ONE-HALF (1/2) TABLET BY MOUTH TWICE DAILY   Multiple Vitamin (MULTIVITAMIN) tablet Take 1 tablet by mouth daily.   nitroGLYCERIN  (NITROSTAT ) 0.4 MG SL tablet Place 1 tablet (0.4 mg total)  under the tongue every 5 (five) minutes as needed for chest pain.   pantoprazole  (PROTONIX ) 40 MG tablet TAKE ONE TABLET BY MOUTH ONCE DAILY   rosuvastatin  (CRESTOR ) 40 MG tablet TAKE 1 TABLET BY MOUTH DAILY   tiZANidine  (ZANAFLEX ) 2 MG tablet Take 1-2 tablets (2-4 mg total) by mouth at bedtime as needed for muscle spasms.   No facility-administered medications prior to visit.        Objective    BP 126/65 (BP Location: Right Arm, Patient Position: Sitting, Cuff Size: Normal)   Pulse 72   Resp 16   Wt 167 lb 14.4 oz (76.2 kg)   SpO2 98%   BMI 25.16 kg/m     Physical Exam Vitals and nursing note reviewed.  Constitutional:      General: He is not in acute distress.    Appearance: Normal appearance.  HENT:     Head: Normocephalic and atraumatic.  Eyes:     General: No scleral icterus.    Conjunctiva/sclera: Conjunctivae normal.  Cardiovascular:     Rate and Rhythm: Normal rate.  Pulmonary:     Effort: Pulmonary effort is normal.  Skin:    Findings: Erythema, lesion and rash present. Rash is urticarial (improved s/p benadryl).     Comments: Redness to anteromedial forearm  Sting point of entry at medial hand, approx. 1 cm  distal to wrist in line with 5th digit. Swelling and erythema to entire left hand. Pictures on chart under media tab for reference.  Neurological:     Mental Status: He is alert and oriented to person, place, and time. Mental status is at baseline.  Psychiatric:        Mood and Affect: Mood normal.        Behavior: Behavior normal.      No results found for any visits on 12/22/23.  Assessment & Plan    Yellow jacket sting, accidental or unintentional, initial encounter -     predniSONE ; Take 60mg  PO daily x 2 days, then40mg  PO daily x 2 days, then 20mg  PO daily x 3 days  Dispense: 13 tablet; Refill: 0  Toxic effect of venom of wasps, accidental (unintentional), initial encounter    Acute localized reaction to yellow jacket sting with significant  swelling and itching. No systemic allergic reaction or respiratory distress. Previous stings without prolonged reactions. - Prescribed prednisone  taper to reduce swelling. - Advised use of Benadryl gel for itching. - Instructed to continue applying cold compresses to the affected area.    Return if symptoms worsen or fail to improve.      I discussed the assessment and treatment plan with the patient  The patient was provided an opportunity to ask questions and all were answered. The patient agreed with the plan and demonstrated an understanding of the instructions.   The patient was advised to call back or seek an in-person evaluation if the symptoms worsen or if the condition fails to improve as anticipated.    LAURAINE LOISE BUOY, DO  [facility] [phone] (phone) [phone] (fax)  [facility]

## 2023-12-26 IMAGING — DX DG CHEST 2V
2 series · 2 of 2 positions shown · non-contrast
Comparison: September 2016

CLINICAL DATA: cough

EXAM:
CHEST - 2 VIEW

[chest pa]
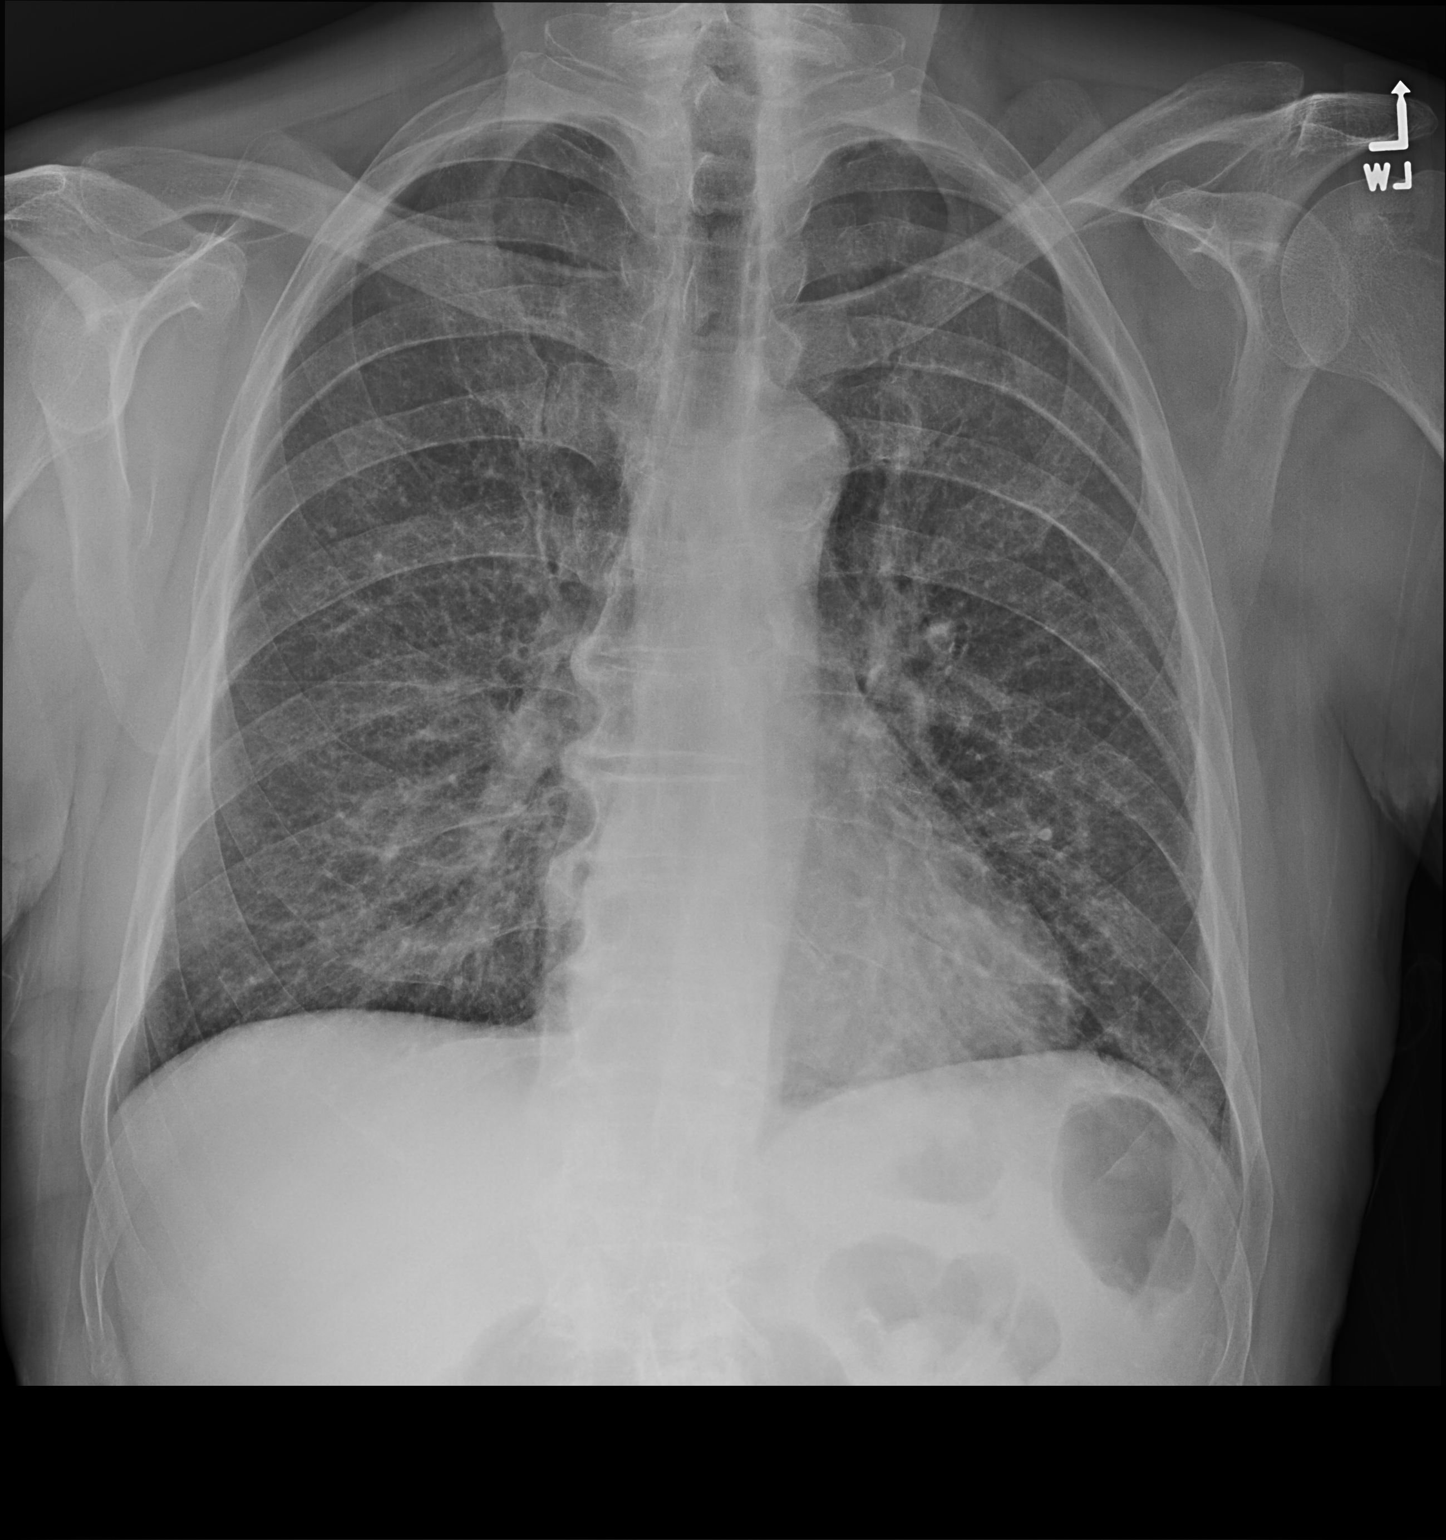

[chest lat]
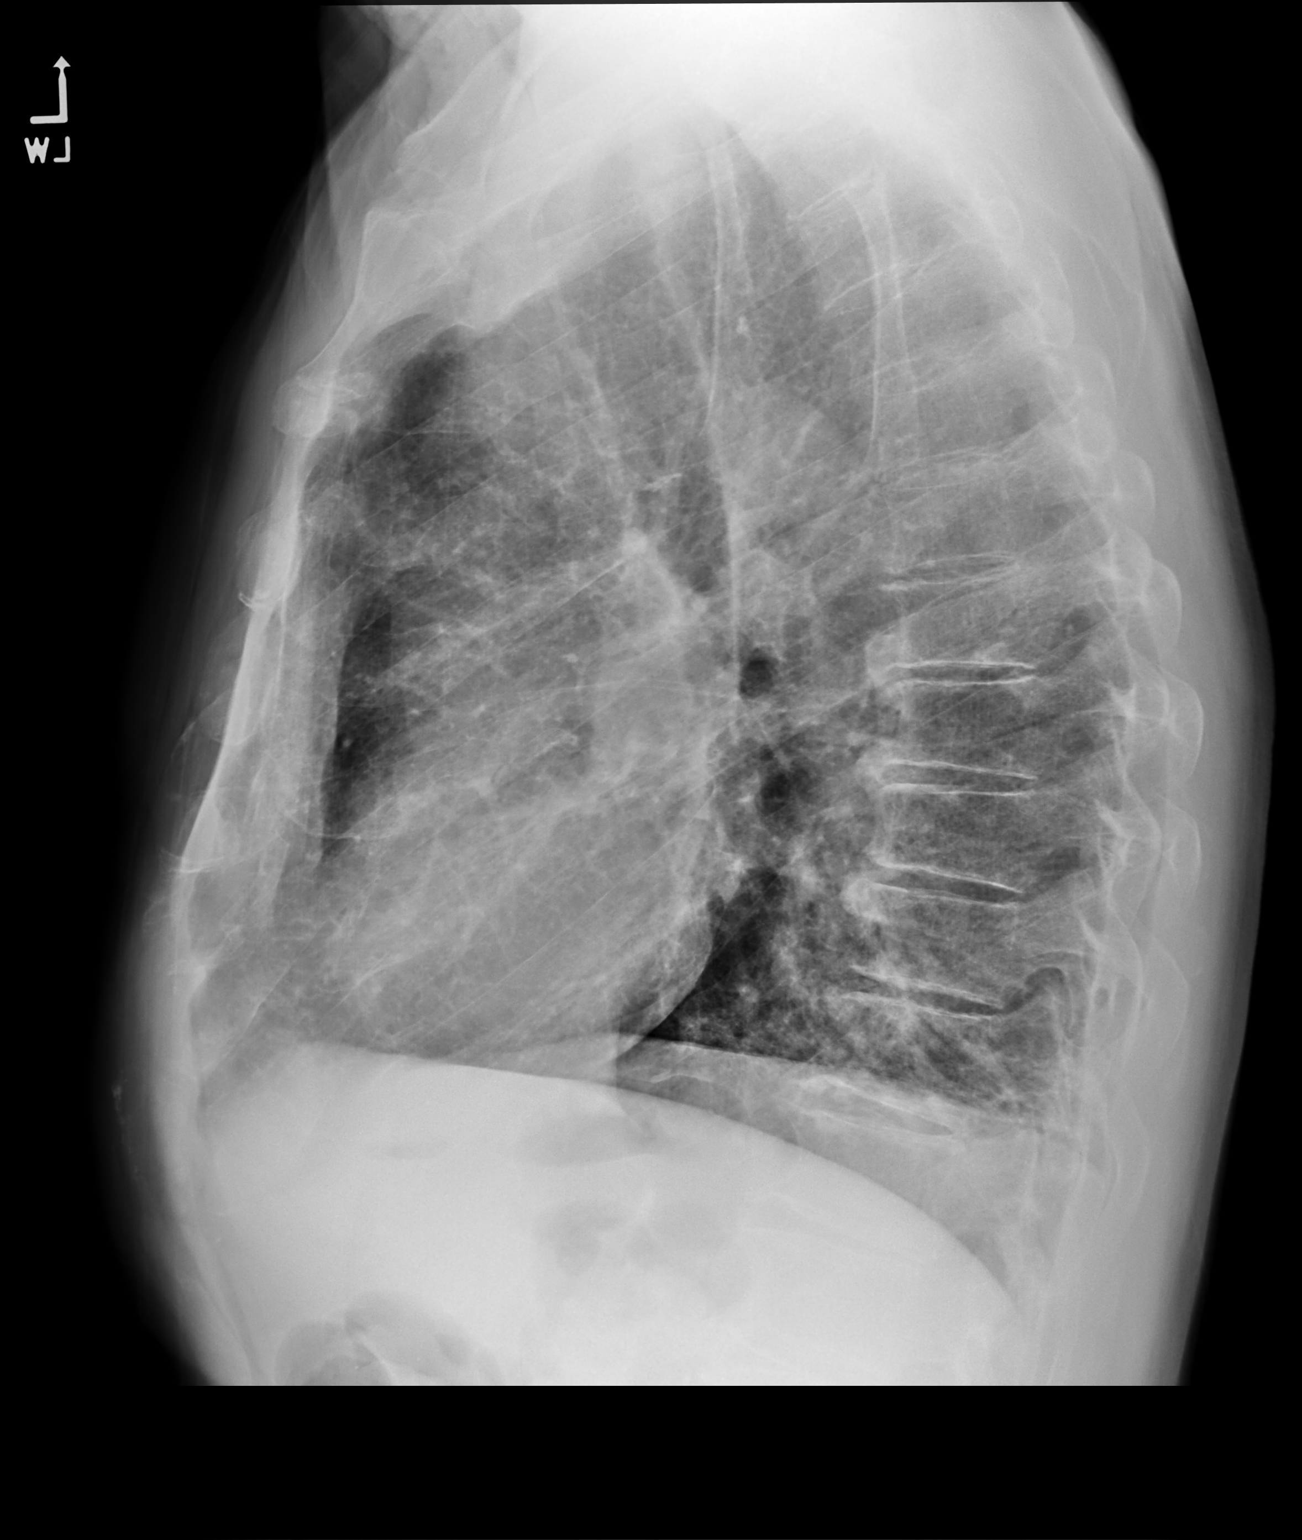

[2 of 2 positions shown; findings below may reference images not displayed]

FINDINGS: The lungs appear hyperinflated with flattening of the diaphragms.
The cardiomediastinal silhouette is within normal limits. No pleural
effusion. No pneumothorax. There is increased streaky consolidation
of the right lower lung which also appears present as retrocardiac
opacity on the lateral view. No pneumothorax. No acute osseous
abnormality.
IMPRESSION: Streaky consolidative opacities in the right lower lung suspicious
for infection. Chronic changes compatible with COPD.

## 2024-01-26 ENCOUNTER — Telehealth: Payer: Self-pay

## 2024-01-26 NOTE — Telephone Encounter (Signed)
 Copied from CRM [id number]. Topic: Appointments - Transfer of Care >> Jan 25, 2024 11:14 AM Antony RAMAN wrote: Pt is requesting to transfer FROM: [name] [name] Pt is requesting to transfer TO: [name] [name] Reason for requested transfer: pt request It is the responsibility of the team the patient would like to transfer to (Dr. Buoy) to reach out to the patient if for any reason this transfer is not acceptable.

## 2024-02-05 ENCOUNTER — Encounter

## 2024-02-05 IMAGING — DX DG CHEST 2V
2 series · 2 of 2 positions shown · non-contrast
Comparison: 05/04/2021, 10/17/2016

CLINICAL DATA: 70-year-old male with a history of repeat xray after
bronchitis.

EXAM:
CHEST - 2 VIEW

[chest pa]
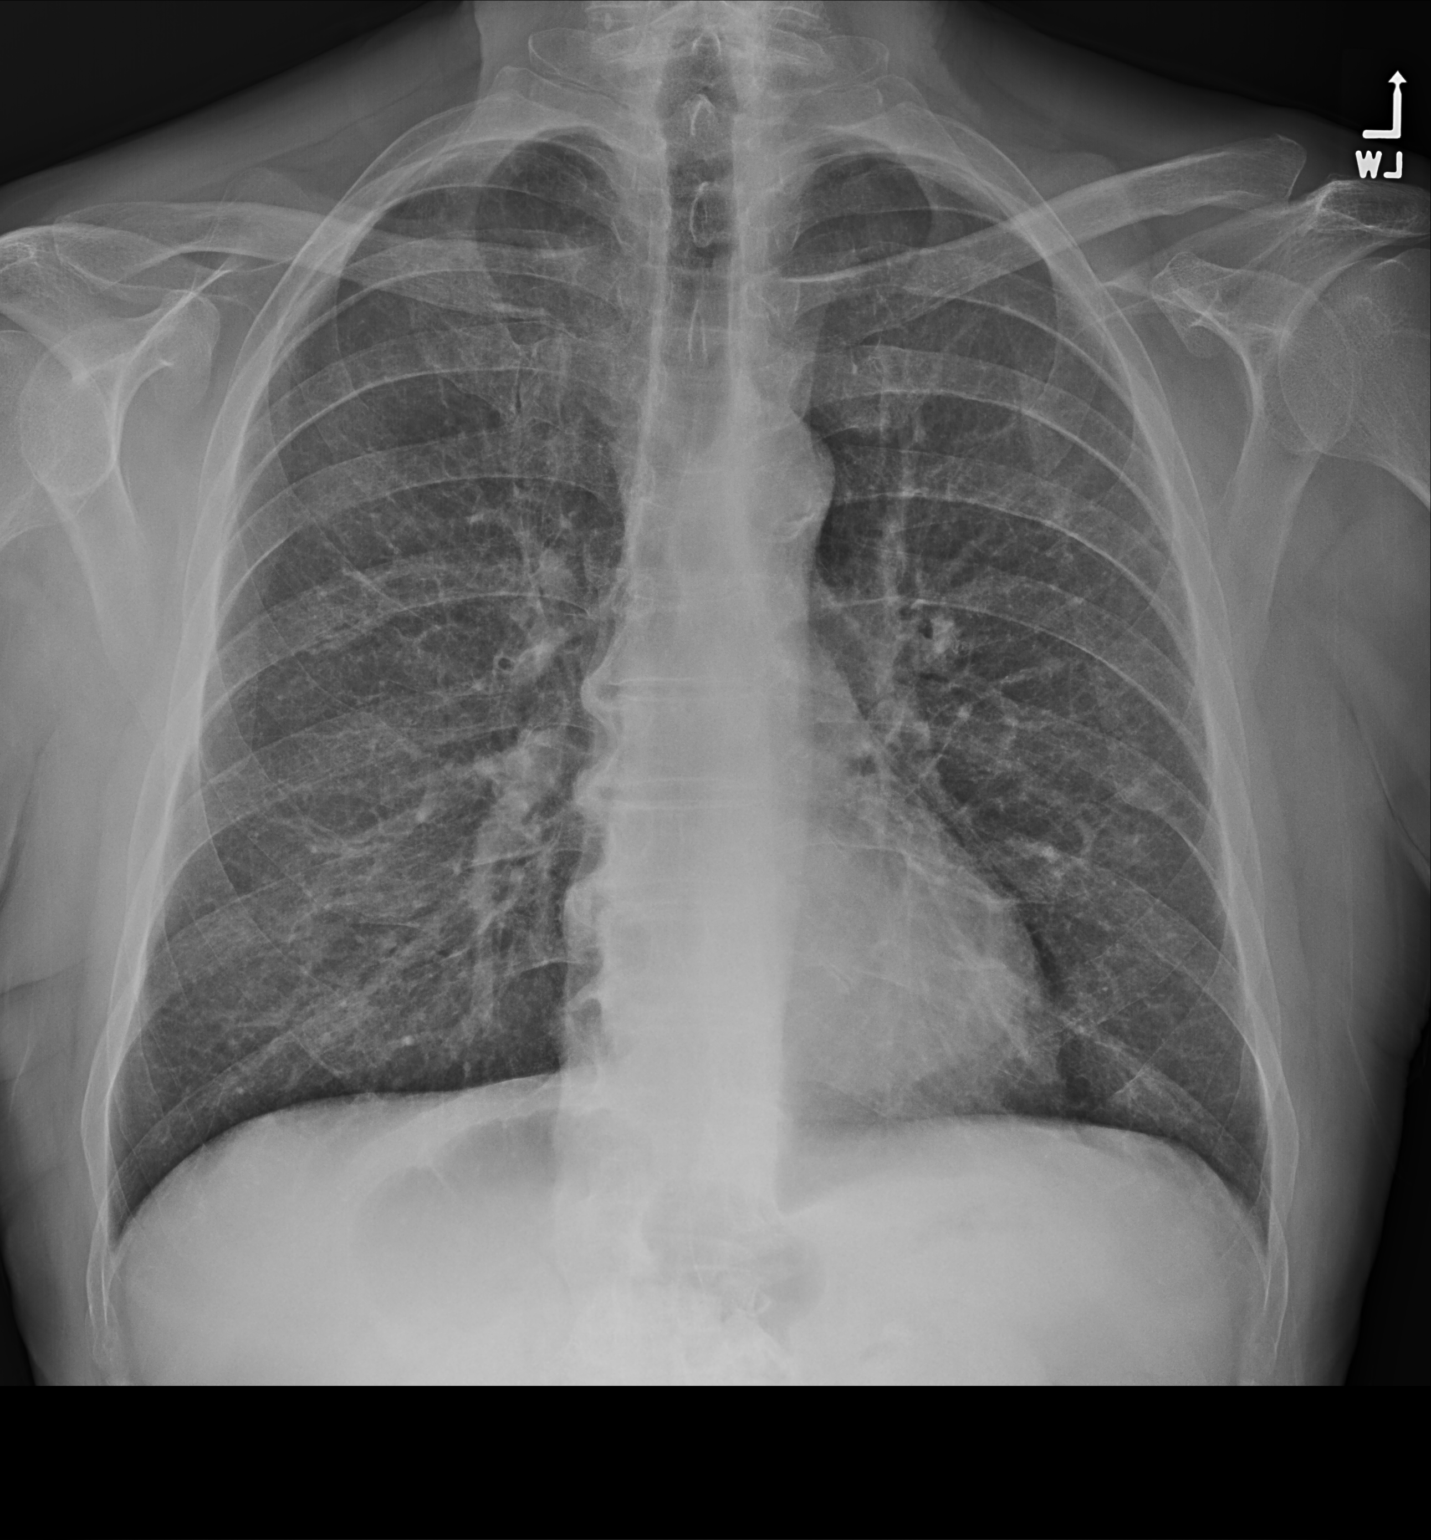

[chest lat]
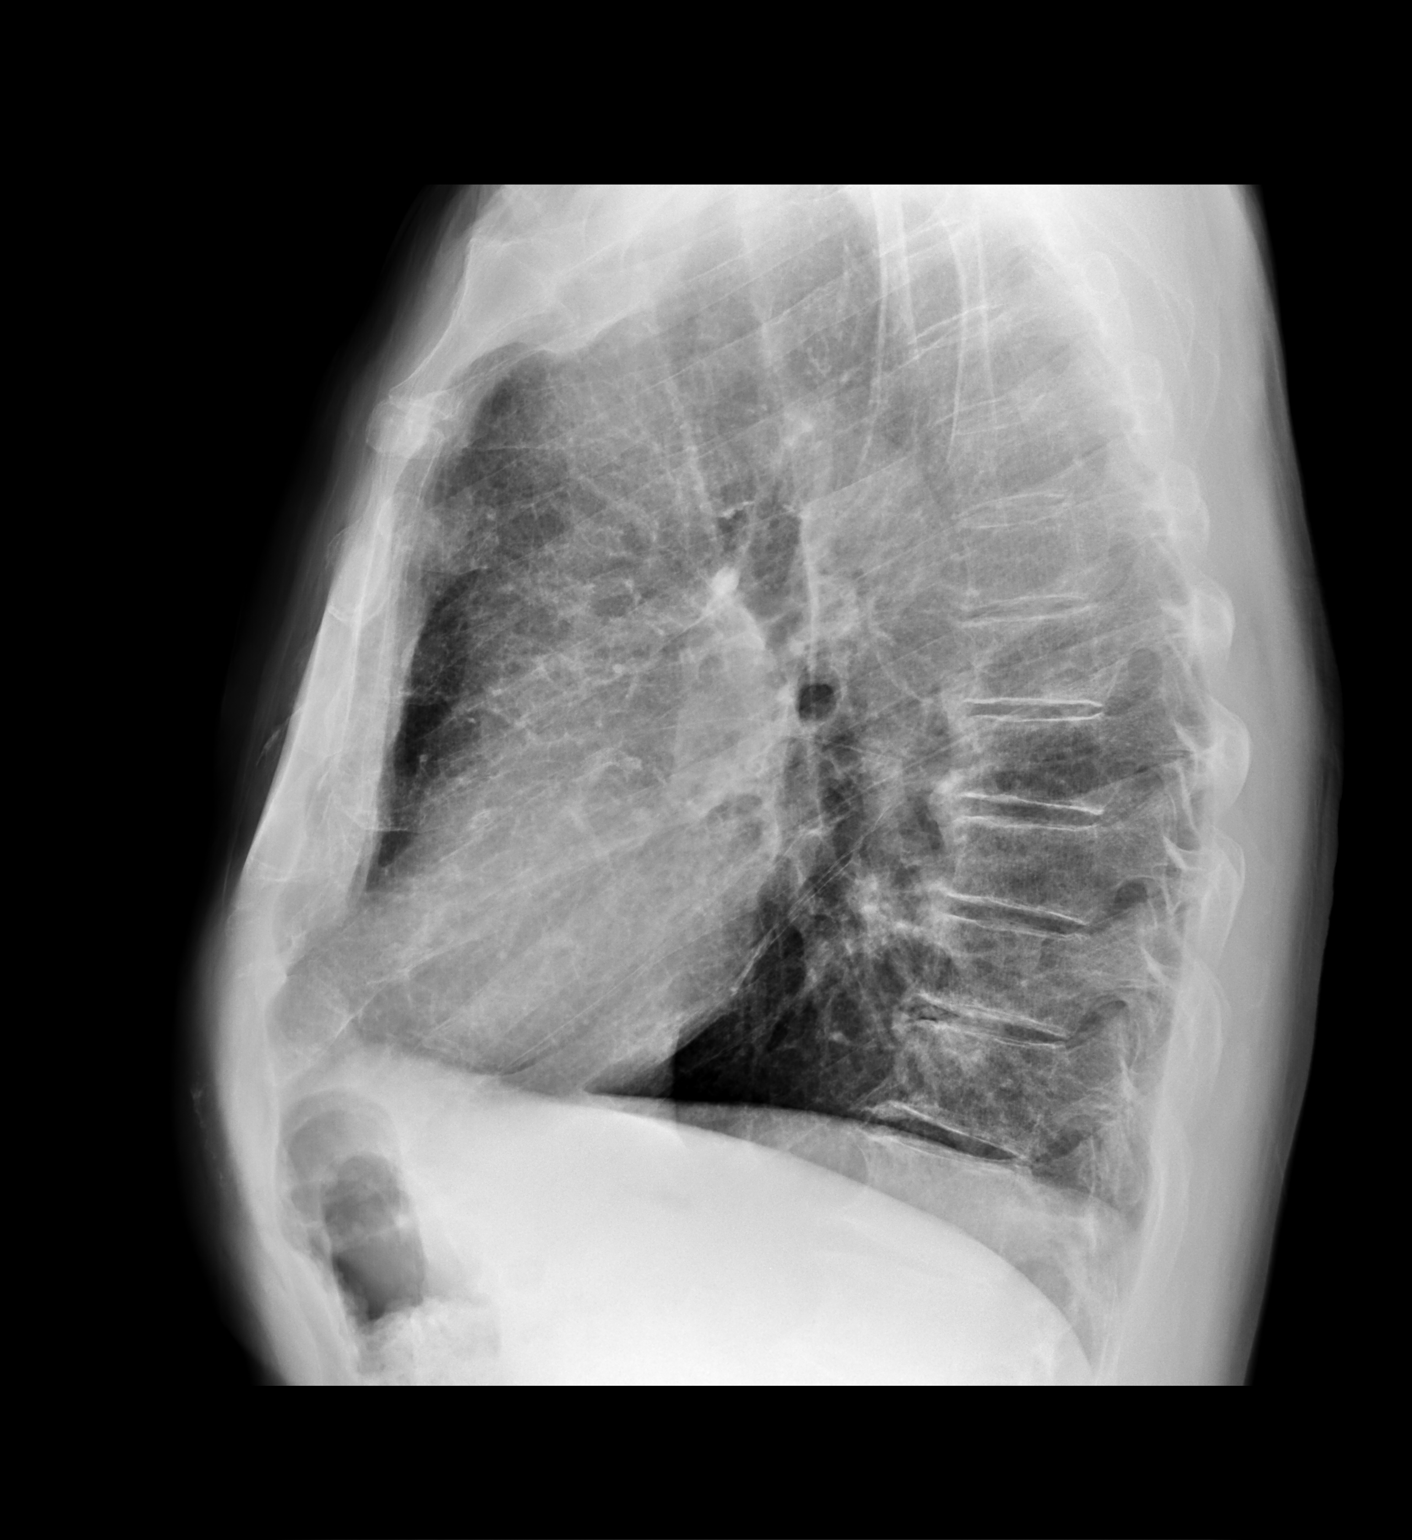

[2 of 2 positions shown; findings below may reference images not displayed]

FINDINGS: Cardiomediastinal silhouette unchanged in size and contour. No
evidence of central vascular congestion. No interlobular septal
thickening.

Stigmata of emphysema, with increased retrosternal airspace,
flattened hemidiaphragms, increased AP diameter, and hyperinflation
on the AP view.

No pneumothorax or pleural effusion. Coarsened interstitial
markings, with no confluent airspace disease.

No acute displaced fracture. Degenerative changes of the spine.
IMPRESSION: Emphysema without evidence of acute cardiopulmonary disease

## 2024-02-26 ENCOUNTER — Ambulatory Visit: Admitting: Family Medicine

## 2024-02-26 ENCOUNTER — Encounter: Payer: Self-pay | Admitting: Family Medicine

## 2024-02-26 VITALS — BP 132/79 | HR 74 | Resp 16 | Ht 71.0 in | Wt 169.7 lb

## 2024-02-26 DIAGNOSIS — F17200 Nicotine dependence, unspecified, uncomplicated: Secondary | ICD-10-CM

## 2024-02-26 DIAGNOSIS — G8929 Other chronic pain: Secondary | ICD-10-CM

## 2024-02-26 DIAGNOSIS — Z23 Encounter for immunization: Secondary | ICD-10-CM | POA: Diagnosis not present

## 2024-02-26 DIAGNOSIS — E785 Hyperlipidemia, unspecified: Secondary | ICD-10-CM

## 2024-02-26 DIAGNOSIS — Z720 Tobacco use: Secondary | ICD-10-CM

## 2024-02-26 DIAGNOSIS — I2510 Atherosclerotic heart disease of native coronary artery without angina pectoris: Secondary | ICD-10-CM | POA: Diagnosis not present

## 2024-02-26 DIAGNOSIS — Z9861 Coronary angioplasty status: Secondary | ICD-10-CM

## 2024-02-26 DIAGNOSIS — J439 Emphysema, unspecified: Secondary | ICD-10-CM | POA: Diagnosis not present

## 2024-02-26 DIAGNOSIS — M542 Cervicalgia: Secondary | ICD-10-CM | POA: Diagnosis not present

## 2024-02-26 DIAGNOSIS — Z7689 Persons encountering health services in other specified circumstances: Secondary | ICD-10-CM

## 2024-02-26 MED ORDER — NAPROXEN 500 MG PO TABS: 500.0000 mg | ORAL_TABLET | Freq: Two times a day (BID) | ORAL | 0 refills | Status: AC | PRN

## 2024-02-26 NOTE — Progress Notes (Addendum)
 New patient visit   Patient: [name] [name]   DOB: 01-10-1951   73 y.o. Male  MRN: [id number] Visit Date: 02/26/2024  Today's healthcare provider: LAURAINE LOISE BUOY, DO   Chief Complaint  Patient presents with   Transitions Of Care    Last PCP: Dr. Jimmy   Medication Refill    Naproxen  500mg  would like refill for his neck arthritis    Subjective    [name] [name] is a 73 y.o. male who presents today as a new patient to establish care.  HPI HPI     Transitions Of Care    Additional comments: Last PCP: Dr. Jimmy        Medication Refill    Additional comments: Naproxen  500mg  would like refill for his neck arthritis       Last edited by Wilfred Hargis RAMAN, CMA on 02/26/2024  3:45 PM.      Last annual exam: 10/11/2023   [name] [name] is a 73 year old male with neck arthritis who presents for medication management.  He uses naproxen  for neck arthritis, taking it only when necessary due to pain. He has had a bottle of 30 tablets since July, indicating infrequent use. 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 He also takes a baby aspirin  daily but skips it when taking naproxen  to avoid interactions. He occasionally uses Tylenol  for pain relief.  He has a history of coronary artery disease with a stent placed in 2007 and three more in 2017. He sees a cardiologist regularly and takes rosuvastatin  and nitroglycerin , though he has not needed the latter recently.  He takes pantoprazole  for Barrett's esophagus, diagnosed long ago, with no current reflux symptoms.  He has a history of smoking since age 57 and had a CT scan in 2014 that showed emphysema. He experiences a morning cough but no other respiratory symptoms. He has not had a lung cancer screening CT scan.  He recalls a past episode of prednisone  use for a yellow jacket sting, which caused significant side effects. 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  No current chest pain, shortness of breath, headaches, dizziness, lightheadedness, congestion, runny nose, ear pain, sore throat, nausea, vomiting, or abdominal pain.    Past Medical History:  Diagnosis Date   BARRETTS ESOPHAGUS    Carotid artery occlusion    COLONIC POLYPS, HX OF    COPD    pt denies    CORONARY ARTERY DISEASE    a. 2007: cath showing 95% stenosis proximal LAD (BMS placed), 40% D1, and 75% RCA stenosis. b. NST 08/2008: no evidence of ischemia c. NST 04/2012: no evidence of ischemia.   GERD    GLAUCOMA    HYPERCHOLESTEROLEMIA    HYPERLIPIDEMIA    NSTEMI (non-ST elevated myocardial infarction) (HCC) 02/25/2016   Other constipation    OTITIS EXTERNA    Peripheral vascular disease    pt denies    Thoracic aorta atherosclerosis    Wears dentures    Full upper, partial lower   Past Surgical History:  Procedure Laterality Date   CARDIAC CATHETERIZATION N/A 02/26/2016   Procedure: Left Heart Cath and Coronary Angiography;  Surgeon: Deatrice DELENA Cage, MD;  Location: [facility];  Service: Cardiovascular;  Laterality: N/A;   CARDIAC CATHETERIZATION N/A 02/26/2016   Procedure: Coronary Stent Intervention;  Surgeon: Deatrice DELENA Cage,  MD;  Location: [facility] IN[facility] CV [facility];  Service: Cardiovascular;  Laterality: N/A;   CATARACT EXTRACTION W/PHACO Right 03/08/2022   Procedure: CATARACT EXTRACTION PHACO AND INTRAOCULAR LENS PLACEMENT (IOC) RIGHT 8.94 00:47.2;  Surgeon: Jaye Fallow, MD;  Location: [facility];  Service: Ophthalmology;  Laterality: Right;   CATARACT EXTRACTION W/PHACO Left 04/12/2022   Procedure: CATARACT EXTRACTION PHACO AND INTRAOCULAR LENS PLACEMENT (IOC) LEFT  6.38  00:41.6;  Surgeon: Jaye Fallow, MD;  Location: [facility];  Service: Ophthalmology;  Laterality: Left;   CORONARY STENT PLACEMENT     ESOPHAGOGASTRODUODENOSCOPY     UMBILICAL HERNIA REPAIR      XI ROBOTIC ASSISTED INGUINAL HERNIA REPAIR WITH MESH Left 08/31/2020   Procedure: XI ROBOTIC ASSISTED LEFT INGUINAL HERNIA REPAIR TAPP;  Surgeon: Gladis Cough, MD;  Location: [facility];  Service: General;  Laterality: Left;   Family Status  Relation Name Status   Mother  (Not Specified)   Father  Deceased at age 57   MGM  Deceased   MGF  Deceased   PGM  Deceased   PGF  Deceased  No partnership data on file   Family History  Problem Relation Age of Onset   Hyperlipidemia Mother    Heart disease Father 56       Before age 52- Open Heart surgery   Social History   Socioeconomic History   Marital status: Married    Spouse name: Not on file   Number of children: Not on file   Years of education: Not on file   Highest education level: Not on file  Occupational History   Not on file  Tobacco Use   Smoking status: Every Day    Current packs/day: 1.00    Average packs/day: 1 pack/day for 55.0 years (55.0 ttl pk-yrs)    Types: Cigarettes    Passive exposure: Never   Smokeless tobacco: Current    Types: Snuff   Tobacco comments:    Started smoking around age 65  Vaping Use   Vaping status: Never Used  Substance and Sexual Activity   Alcohol use: Yes    Alcohol/week: 4.0 standard drinks of alcohol    Types: 4 Cans of beer per week    Comment: occ   Drug use: No   Sexual activity: Not on file  Other Topics Concern   Not on file  Social History Narrative   No living will    Would want wife, then son, would be health care POA   Would accept resuscitation attempts--but no prolonged artificial ventilation   No  tube feeds if cognitively unaware   Social Drivers of Health   Financial Resource Strain: Not on file  Food Insecurity: Not on file  Transportation Needs: Not on file  Physical Activity: Not on file  Stress: Not on file  Social Connections: Not on file   Outpatient Medications Prior to Visit  Medication Sig   aspirin  EC 81 MG tablet Take 1 tablet (81 mg total)  by mouth daily.   latanoprost  (XALATAN ) 0.005 % ophthalmic solution Place 1 drop into the right eye at bedtime.   metoprolol  tartrate (LOPRESSOR ) 25 MG tablet TAKE ONE-HALF (1/2) TABLET BY MOUTH TWICE DAILY   Multiple Vitamin (MULTIVITAMIN) tablet Take 1 tablet by mouth daily.   nitroGLYCERIN  (NITROSTAT ) 0.4 MG SL tablet Place 1 tablet (0.4 mg total) under the tongue every 5 (five) minutes as needed for chest pain.   pantoprazole  (PROTONIX ) 40 MG tablet TAKE ONE TABLET BY MOUTH  ONCE DAILY   rosuvastatin  (CRESTOR ) 40 MG tablet TAKE 1 TABLET BY MOUTH DAILY   [DISCONTINUED] naproxen  (NAPROSYN ) 500 MG tablet Take 500 mg by mouth 2 (two) times daily with a meal. (Patient taking differently: Take 500 mg by mouth daily.)   [DISCONTINUED] predniSONE  (DELTASONE ) 20 MG tablet Take 60mg  PO daily x 2 days, then40mg  PO daily x 2 days, then 20mg  PO daily x 3 days   [DISCONTINUED] tiZANidine  (ZANAFLEX ) 2 MG tablet Take 1-2 tablets (2-4 mg total) by mouth at bedtime as needed for muscle spasms.   No facility-administered medications prior to visit.   No Known Allergies  Immunization History  Administered Date(s) Administered   Fluad Quad(high Dose 65+) 01/05/2019, 02/25/2020   Fluad Trivalent(High Dose 65+) 02/02/2023   INFLUENZA, HIGH DOSE SEASONAL PF 02/10/2018, 02/26/2024   Influenza, Seasonal, Injecte, Preservative Fre 02/02/2016   Influenza,inj,Quad PF,6+ Mos 01/05/2022   Influenza-Unspecified 01/09/2014   Pneumococcal Conjugate-13 10/17/2016   Td 11/23/1997    Health Maintenance  Topic Date Due   Lung Cancer Screening  Never done   DTaP/Tdap/Td (2 - Tdap) 10/10/2024 (Originally 11/24/2007)   Colonoscopy  10/10/2024 (Originally 09/15/2019)   Medicare Annual Wellness (AWV)  10/10/2024   Influenza Vaccine  Completed   Meningococcal B Vaccine  Aged Out   Pneumococcal Vaccine: 50+ Years  Discontinued   COVID-19 Vaccine  Discontinued   Hepatitis C Screening  Discontinued   Zoster Vaccines-  Shingrix  Discontinued    Patient Care Team: Aldean Pipe, Lauraine SAILOR, DO as PCP - General (Family Medicine) Pietro Redell RAMAN, MD as PCP - Cardiology (Cardiology)       Objective    BP 132/79   Pulse 74   Resp 16   Ht 5' 11 (1.803 m)   Wt 169 lb 11.2 oz (77 kg)   SpO2 98%   BMI 23.67 kg/m     Physical Exam Vitals and nursing note reviewed.  Constitutional:      General: He is not in acute distress.    Appearance: Normal appearance. He is not diaphoretic.  HENT:     Head: Normocephalic and atraumatic.  Eyes:     General: No scleral icterus.    Conjunctiva/sclera: Conjunctivae normal.  Cardiovascular:     Rate and Rhythm: Normal rate and regular rhythm.     Pulses: Normal pulses.     Heart sounds: Normal heart sounds. No murmur heard. Pulmonary:     Effort: Pulmonary effort is normal. No respiratory distress.     Breath sounds: Normal breath sounds. No wheezing or rhonchi.  Musculoskeletal:     Cervical back: Neck supple.     Right lower leg: No edema.     Left lower leg: No edema.  Lymphadenopathy:     Cervical: No cervical adenopathy.  Skin:    General: Skin is warm and dry.     Findings: No rash.  Neurological:     Mental Status: He is alert and oriented to person, place, and time. Mental status is at baseline.  Psychiatric:        Mood and Affect: Mood normal.        Behavior: Behavior normal.     Depression Screen    02/26/2024    3:48 PM 12/22/2023    2:31 PM 10/11/2023   12:58 PM 10/11/2023   12:25 PM  PHQ 2/9 Scores  PHQ - 2 Score 0 0 0 0   No results found for any visits on 02/26/24.  Assessment &  Plan     Chronic neck pain -     Naproxen ; Take 1 tablet (500 mg total) by mouth 2 (two) times daily as needed (neck pain).  Dispense: 90 tablet; Refill: 0  Encounter to establish care  CAD S/P percutaneous coronary angioplasty Assessment & Plan: Atherosclerotic heart disease with stent placements in 2007 and 2017. No recent episodes of chest pain or  need for nitroglycerin . Continues on metoprolol , rosuvastatin  and aspirin . - Continue rosuvastatin  and aspirin  as prescribed by cardiologist. - Ensure follow-up with cardiology as scheduled.    Emphysema lung (HCC) Assessment & Plan: Chronic, stable.  Demonstrated repeatedly on imaging (CT in 2014 and subsequent CXRs). Pt asymptomatic except chronic morning coughing, which improves for the rest of the day.  No recent bronchitis or significant respiratory symptoms. - Referred to pulmonology for lung cancer screening CT scan.   Nicotine dependence with current use -     Ambulatory Referral for Lung Cancer Scre  Declined smoking cessation  Hyperlipidemia LDL goal <70 Assessment & Plan: Chronic, managed with rosuvastatin  40 mg daily.  Plan to check lipid panel on follow-up visit.  No changes today.   Needs flu shot -     Flu vaccine HIGH DOSE PF(Fluzone Trivalent)      Encounter to establish care   Chronic neck pain Chronic cervical arthritis with chronic intermittent neck pain, exacerbated by occupational activities. Managed with naproxen  as needed, with infrequent use. No current interest in physical therapy. - Continue naproxen  as needed for neck pain. - Monitor naproxen  use; consider alternative options if use becomes more frequent.  Nicotine dependence with current use; declined smoking cessation  Long-standing nicotine dependence since age 80. No current interest in smoking cessation. Discussed potential benefits of lung cancer screening due to smoking history. - Encouraged smoking cessation. - Discussed lung cancer screening CT scan with pulmonology; referral sent.  Barrett's esophagus Diagnosed previously. No current symptoms of reflux, nausea, vomiting, or significant weight changes.  No acute concerns.  Continue to monitor.       Return in about 4 months (around 06/25/2024) for Chronic f/u.     I discussed the assessment and treatment plan with the patient  The  patient was provided an opportunity to ask questions and all were answered. The patient agreed with the plan and demonstrated an understanding of the instructions.   The patient was advised to call back or seek an in-person evaluation if the symptoms worsen or if the condition fails to improve as anticipated.    LAURAINE LOISE BUOY, DO  [facility] [phone] (phone) [phone] (fax)  [facility]

## 2024-02-26 NOTE — Assessment & Plan Note (Addendum)
 Chronic, stable.  Demonstrated repeatedly on imaging (CT in 2014 and subsequent CXRs). Pt asymptomatic except chronic morning coughing, which improves for the rest of the day.  No recent bronchitis or significant respiratory symptoms. - Referred to pulmonology for lung cancer screening CT scan.

## 2024-03-04 NOTE — Assessment & Plan Note (Signed)
 Chronic, managed with rosuvastatin  40 mg daily.  Plan to check lipid panel on follow-up visit.  No changes today.

## 2024-03-04 NOTE — Assessment & Plan Note (Addendum)
 Atherosclerotic heart disease with stent placements in 2007 and 2017. No recent episodes of chest pain or need for nitroglycerin . Continues on metoprolol , rosuvastatin  and aspirin . - Continue rosuvastatin  and aspirin  as prescribed by cardiologist. - Ensure follow-up with cardiology as scheduled.

## 2024-07-02 ENCOUNTER — Encounter
